# Patient Record
Sex: Female | Born: 1945 | Race: White | Hispanic: No | Smoking: Never smoker
Health system: Southern US, Community
[De-identification: ages and names within clinical notes are randomized; demographics above are authoritative.]

## PROBLEM LIST (undated history)

## (undated) DIAGNOSIS — M159 Polyosteoarthritis, unspecified: Secondary | ICD-10-CM

## (undated) DIAGNOSIS — K649 Unspecified hemorrhoids: Secondary | ICD-10-CM

## (undated) DIAGNOSIS — K5903 Drug induced constipation: Secondary | ICD-10-CM

## (undated) DIAGNOSIS — H2513 Age-related nuclear cataract, bilateral: Secondary | ICD-10-CM

## (undated) DIAGNOSIS — M898X9 Other specified disorders of bone, unspecified site: Secondary | ICD-10-CM

## (undated) DIAGNOSIS — R42 Dizziness and giddiness: Secondary | ICD-10-CM

## (undated) DIAGNOSIS — H401111 Primary open-angle glaucoma, right eye, mild stage: Secondary | ICD-10-CM

## (undated) DIAGNOSIS — N390 Urinary tract infection, site not specified: Secondary | ICD-10-CM

## (undated) DIAGNOSIS — R55 Syncope and collapse: Secondary | ICD-10-CM

## (undated) DIAGNOSIS — Z9889 Other specified postprocedural states: Secondary | ICD-10-CM

## (undated) DIAGNOSIS — M1990 Unspecified osteoarthritis, unspecified site: Secondary | ICD-10-CM

## (undated) DIAGNOSIS — R269 Unspecified abnormalities of gait and mobility: Secondary | ICD-10-CM

## (undated) DIAGNOSIS — R739 Hyperglycemia, unspecified: Secondary | ICD-10-CM

## (undated) DIAGNOSIS — M6281 Muscle weakness (generalized): Secondary | ICD-10-CM

## (undated) DIAGNOSIS — R350 Frequency of micturition: Secondary | ICD-10-CM

## (undated) DIAGNOSIS — Z91199 Patient's noncompliance with other medical treatment and regimen due to unspecified reason: Secondary | ICD-10-CM

## (undated) DIAGNOSIS — E785 Hyperlipidemia, unspecified: Secondary | ICD-10-CM

## (undated) DIAGNOSIS — N3281 Overactive bladder: Secondary | ICD-10-CM

## (undated) DIAGNOSIS — T4145XA Adverse effect of unspecified anesthetic, initial encounter: Secondary | ICD-10-CM

## (undated) DIAGNOSIS — T380X5A Adverse effect of glucocorticoids and synthetic analogues, initial encounter: Secondary | ICD-10-CM

## (undated) DIAGNOSIS — T148XXA Other injury of unspecified body region, initial encounter: Secondary | ICD-10-CM

## (undated) DIAGNOSIS — G319 Degenerative disease of nervous system, unspecified: Secondary | ICD-10-CM

## (undated) DIAGNOSIS — I10 Essential (primary) hypertension: Secondary | ICD-10-CM

## (undated) DIAGNOSIS — M7730 Calcaneal spur, unspecified foot: Secondary | ICD-10-CM

## (undated) DIAGNOSIS — Z8616 Personal history of COVID-19: Secondary | ICD-10-CM

## (undated) DIAGNOSIS — E46 Unspecified protein-calorie malnutrition: Secondary | ICD-10-CM

## (undated) DIAGNOSIS — T8859XA Other complications of anesthesia, initial encounter: Secondary | ICD-10-CM

## (undated) DIAGNOSIS — D72829 Elevated white blood cell count, unspecified: Secondary | ICD-10-CM

## (undated) DIAGNOSIS — D696 Thrombocytopenia, unspecified: Secondary | ICD-10-CM

## (undated) DIAGNOSIS — Z8619 Personal history of other infectious and parasitic diseases: Secondary | ICD-10-CM

## (undated) DIAGNOSIS — H401121 Primary open-angle glaucoma, left eye, mild stage: Secondary | ICD-10-CM

## (undated) DIAGNOSIS — E538 Deficiency of other specified B group vitamins: Secondary | ICD-10-CM

## (undated) DIAGNOSIS — G8918 Other acute postprocedural pain: Secondary | ICD-10-CM

## (undated) DIAGNOSIS — E8809 Other disorders of plasma-protein metabolism, not elsewhere classified: Secondary | ICD-10-CM

## (undated) DIAGNOSIS — D62 Acute posthemorrhagic anemia: Secondary | ICD-10-CM

## (undated) DIAGNOSIS — H409 Unspecified glaucoma: Secondary | ICD-10-CM

## (undated) DIAGNOSIS — R030 Elevated blood-pressure reading, without diagnosis of hypertension: Secondary | ICD-10-CM

## (undated) DIAGNOSIS — F331 Major depressive disorder, recurrent, moderate: Secondary | ICD-10-CM

## (undated) DIAGNOSIS — M5030 Other cervical disc degeneration, unspecified cervical region: Secondary | ICD-10-CM

## (undated) DIAGNOSIS — K219 Gastro-esophageal reflux disease without esophagitis: Secondary | ICD-10-CM

## (undated) DIAGNOSIS — E782 Mixed hyperlipidemia: Secondary | ICD-10-CM

## (undated) DIAGNOSIS — H269 Unspecified cataract: Secondary | ICD-10-CM

## (undated) DIAGNOSIS — R112 Nausea with vomiting, unspecified: Secondary | ICD-10-CM

## (undated) HISTORY — DX: Elevated white blood cell count, unspecified: D72.829

## (undated) HISTORY — DX: Elevated blood-pressure reading, without diagnosis of hypertension: R03.0

## (undated) HISTORY — DX: Unspecified glaucoma: H40.9

## (undated) HISTORY — PX: SHOULDER SURGERY: SHX246

## (undated) HISTORY — DX: Mixed hyperlipidemia: E78.2

## (undated) HISTORY — DX: Overactive bladder: N32.81

## (undated) HISTORY — PX: TONSILLECTOMY: SUR1361

## (undated) HISTORY — DX: Calcaneal spur, unspecified foot: M77.30

## (undated) HISTORY — DX: Other injury of unspecified body region, initial encounter: T14.8XXA

## (undated) HISTORY — PX: OVARIAN CYST REMOVAL: SHX89

## (undated) HISTORY — PX: BLADDER SUSPENSION: SHX72

## (undated) HISTORY — PX: KNEE SURGERY: SHX244

## (undated) HISTORY — DX: Thrombocytopenia, unspecified: D69.6

## (undated) HISTORY — DX: Patient's noncompliance with other medical treatment and regimen due to unspecified reason: Z91.199

## (undated) HISTORY — DX: Dizziness and giddiness: R42

## (undated) HISTORY — DX: Other specified disorders of bone, unspecified site: M89.8X9

## (undated) HISTORY — PX: CERVICAL FUSION: SHX112

## (undated) HISTORY — DX: Deficiency of other specified B group vitamins: E53.8

## (undated) HISTORY — DX: Other cervical disc degeneration, unspecified cervical region: M50.30

## (undated) HISTORY — DX: Polyosteoarthritis, unspecified: M15.9

## (undated) HISTORY — DX: Personal history of COVID-19: Z86.16

## (undated) HISTORY — DX: Age-related nuclear cataract, bilateral: H25.13

## (undated) HISTORY — DX: Other acute postprocedural pain: G89.18

## (undated) HISTORY — PX: CERVICAL DISC SURGERY: SHX588

## (undated) HISTORY — DX: Muscle weakness (generalized): M62.81

## (undated) HISTORY — DX: Unspecified cataract: H26.9

## (undated) HISTORY — DX: Unspecified osteoarthritis, unspecified site: M19.90

## (undated) HISTORY — DX: Major depressive disorder, recurrent, moderate: F33.1

## (undated) HISTORY — DX: Acute posthemorrhagic anemia: D62

## (undated) HISTORY — PX: APPENDECTOMY: SHX54

## (undated) HISTORY — DX: Degenerative disease of nervous system, unspecified: G31.9

## (undated) HISTORY — DX: Essential (primary) hypertension: I10

## (undated) HISTORY — DX: Primary open-angle glaucoma, right eye, mild stage: H40.1111

## (undated) HISTORY — DX: Dizziness and giddiness: R55

## (undated) HISTORY — DX: Hyperglycemia, unspecified: R73.9

## (undated) HISTORY — DX: Other disorders of plasma-protein metabolism, not elsewhere classified: E88.09

## (undated) HISTORY — DX: Hyperglycemia, unspecified: T38.0X5A

## (undated) HISTORY — PX: CHOLECYSTECTOMY: SHX55

## (undated) HISTORY — DX: Hyperlipidemia, unspecified: E78.5

## (undated) HISTORY — DX: Unspecified protein-calorie malnutrition: E46

## (undated) HISTORY — DX: Unspecified abnormalities of gait and mobility: R26.9

## (undated) HISTORY — DX: Frequency of micturition: R35.0

## (undated) HISTORY — DX: Unspecified hemorrhoids: K64.9

## (undated) HISTORY — DX: Gastro-esophageal reflux disease without esophagitis: K21.9

## (undated) HISTORY — DX: Drug induced constipation: K59.03

## (undated) HISTORY — DX: Primary open-angle glaucoma, left eye, mild stage: H40.1121

---

## 1999-01-06 ENCOUNTER — Other Ambulatory Visit: Admission: RE | Admit: 1999-01-06 | Discharge: 1999-01-06 | Payer: Self-pay | Admitting: Obstetrics and Gynecology

## 2000-02-02 ENCOUNTER — Other Ambulatory Visit: Admission: RE | Admit: 2000-02-02 | Discharge: 2000-02-02 | Payer: Self-pay | Admitting: Obstetrics and Gynecology

## 2001-02-04 ENCOUNTER — Other Ambulatory Visit: Admission: RE | Admit: 2001-02-04 | Discharge: 2001-02-04 | Payer: Self-pay | Admitting: Obstetrics and Gynecology

## 2001-05-15 ENCOUNTER — Ambulatory Visit (HOSPITAL_COMMUNITY): Admission: RE | Admit: 2001-05-15 | Discharge: 2001-05-15 | Payer: Self-pay | Admitting: Gastroenterology

## 2002-02-11 ENCOUNTER — Other Ambulatory Visit: Admission: RE | Admit: 2002-02-11 | Discharge: 2002-02-11 | Payer: Self-pay | Admitting: Obstetrics and Gynecology

## 2003-04-27 ENCOUNTER — Other Ambulatory Visit: Admission: RE | Admit: 2003-04-27 | Discharge: 2003-04-27 | Payer: Self-pay | Admitting: Obstetrics and Gynecology

## 2004-03-30 ENCOUNTER — Ambulatory Visit (HOSPITAL_COMMUNITY): Admission: RE | Admit: 2004-03-30 | Discharge: 2004-03-30 | Payer: Self-pay | Admitting: Obstetrics and Gynecology

## 2004-03-30 ENCOUNTER — Encounter (INDEPENDENT_AMBULATORY_CARE_PROVIDER_SITE_OTHER): Payer: Self-pay | Admitting: *Deleted

## 2004-04-26 ENCOUNTER — Other Ambulatory Visit: Admission: RE | Admit: 2004-04-26 | Discharge: 2004-04-26 | Payer: Self-pay | Admitting: Obstetrics and Gynecology

## 2005-05-26 ENCOUNTER — Other Ambulatory Visit: Admission: RE | Admit: 2005-05-26 | Discharge: 2005-05-26 | Payer: Self-pay | Admitting: Obstetrics and Gynecology

## 2007-07-22 ENCOUNTER — Encounter: Admission: RE | Admit: 2007-07-22 | Discharge: 2007-07-22 | Payer: Self-pay | Admitting: Orthopedic Surgery

## 2007-08-07 ENCOUNTER — Encounter: Admission: RE | Admit: 2007-08-07 | Discharge: 2007-08-07 | Payer: Self-pay | Admitting: Orthopedic Surgery

## 2009-02-20 ENCOUNTER — Encounter: Admission: RE | Admit: 2009-02-20 | Discharge: 2009-02-20 | Payer: Self-pay | Admitting: Internal Medicine

## 2009-03-29 ENCOUNTER — Encounter: Admission: RE | Admit: 2009-03-29 | Discharge: 2009-03-29 | Payer: Self-pay | Admitting: Neurosurgery

## 2009-05-10 ENCOUNTER — Ambulatory Visit (HOSPITAL_COMMUNITY): Admission: RE | Admit: 2009-05-10 | Discharge: 2009-05-11 | Payer: Self-pay | Admitting: Neurosurgery

## 2009-06-02 ENCOUNTER — Encounter: Admission: RE | Admit: 2009-06-02 | Discharge: 2009-06-02 | Payer: Self-pay | Admitting: Neurosurgery

## 2009-07-01 ENCOUNTER — Encounter: Admission: RE | Admit: 2009-07-01 | Discharge: 2009-07-01 | Payer: Self-pay | Admitting: Neurosurgery

## 2010-01-15 ENCOUNTER — Encounter: Admission: RE | Admit: 2010-01-15 | Discharge: 2010-01-15 | Payer: Self-pay | Admitting: Neurosurgery

## 2010-01-26 ENCOUNTER — Encounter: Admission: RE | Admit: 2010-01-26 | Discharge: 2010-01-26 | Payer: Self-pay | Admitting: Neurosurgery

## 2011-01-03 ENCOUNTER — Other Ambulatory Visit: Payer: Self-pay | Admitting: Neurosurgery

## 2011-01-03 DIAGNOSIS — M542 Cervicalgia: Secondary | ICD-10-CM

## 2011-01-08 ENCOUNTER — Ambulatory Visit
Admission: RE | Admit: 2011-01-08 | Discharge: 2011-01-08 | Disposition: A | Discharge status: Home / Self Care | Payer: Medicare Other | Source: Ambulatory Visit | Attending: Neurosurgery | Admitting: Neurosurgery

## 2011-01-08 DIAGNOSIS — M542 Cervicalgia: Secondary | ICD-10-CM

## 2011-02-20 LAB — URINE MICROSCOPIC-ADD ON

## 2011-02-20 LAB — URINALYSIS, ROUTINE W REFLEX MICROSCOPIC
Bilirubin Urine: NEGATIVE
Bilirubin Urine: NEGATIVE
Glucose, UA: NEGATIVE mg/dL
Ketones, ur: NEGATIVE mg/dL
Leukocytes, UA: NEGATIVE
Protein, ur: NEGATIVE mg/dL
Protein, ur: NEGATIVE mg/dL
Specific Gravity, Urine: 1.019 (ref 1.005–1.030)
Urobilinogen, UA: 0.2 mg/dL (ref 0.0–1.0)
pH: 5.5 (ref 5.0–8.0)

## 2011-02-20 LAB — BASIC METABOLIC PANEL
Chloride: 107 mEq/L (ref 96–112)
Creatinine, Ser: 0.75 mg/dL (ref 0.4–1.2)
GFR calc non Af Amer: >60 mL/min (ref >60)
Potassium: 3.9 mEq/L (ref 3.5–5.1)
Sodium: 141 mEq/L (ref 135–145)

## 2011-02-20 LAB — CBC
Hemoglobin: 13.2 g/dL (ref 12.0–15.0)
MCV: 86.2 fL (ref 78.0–100.0)
RBC: 4.57 MIL/uL (ref 3.87–5.11)
RDW: 14.3 % (ref 11.5–15.5)

## 2011-02-20 LAB — URINE CULTURE: Colony Count: NO GROWTH

## 2011-03-28 NOTE — Op Note (Signed)
[name], [name]                 ACCOUNT NO.:  [id number]   MEDICAL RECORD NO.:  [id number]          PATIENT TYPE:  OIB   LOCATION:  3030                         FACILITY:  [facility]   PHYSICIAN:  Donalee Citrin, M.D.        DATE OF BIRTH:  1946/01/18   DATE OF PROCEDURE:  05/10/2009  DATE OF DISCHARGE:                               OPERATIVE REPORT   PREOPERATIVE DIAGNOSES:  Cervical spondylosis with stenosis, C5  radiculopathy and early myelopathy.   PROCEDURE:  Intracervical diskectomies and fusion at C3-4 and C4-5 using  6-mm allograft wedge at C3-4 and a 7-mm allograft wedge at C4-5, a 40-mm  venture plate with six 10-UV varus screws.   SURGEON:  Donalee Citrin, MD   ASSISTANT:  Kathaleen Maser. Pool, MD   ANESTHESIA:  Endotracheal.   HISTORY OF PRESENT ILLNESS:  The patient is a very pleasant 65 year old  female who has had progressive worsening of neck pain radiating to  predominantly her left shoulder with numbness, tingling, and weakness in  her hands.  MRI scan showed severe cervical stenosis at C3-4 and C4-5,  predominantly from spondylosis with bi-foraminal stenosis at C5 nerve  root, central canal stenosis and spinal cord compression at C3-4.  The  patient is failure of conservative treatment and due to her clinical  exam and MRI findings, it was recommended 2-level anterior cervical  diskectomy and fusion.  Risks and benefits of the operation were  explained to the patient as seen in this report.   The patient was brought to the OR and induced under anesthesia with  supine in slight extension 5 pounds of halter traction.  The right side  of the next was prepped and draped in sterile fashion.  Preop incision  was localized at the appropriate level.  A curvilinear incision was made  just off midline to the anterior border of the sternocleidomastoid.  The  superficial layer of the platysma was dissected out and divided  longitudinally.  The avascular plane between the  sternocleidomastoid and  strap muscle was developed down to the prevertebral fascia.  Prevertebral fascia was dissected with Kitner.  Intraoperative  fluoroscopy confirmed localization at appropriate level being C4-5.  Annulotomy was made with a 15-mm scalpel and marked the disk place and  longus colli was reflected laterally at both C3-4 and C4-5, then self-  retaining retractor was placed, both disk spaces were further incised.  Large anterior osteophytes were bitten off the C3 and C4 vertebral  bodies, both disk spaces were drilled down the post annulus and  osteophyte complex, first working at C4-5 under microscopic  illumination.  A 1-mm Kerrison punch was used to underbite both the C4  and C5 vertebral bodies to get underneath the PLL, which was removed in  piecemeal fashion exposing the dura and thecal sac.  Then aggressive  under biting of both endplates marching across to the level of both C5  pedicles, and both C5 neural foramina were identified and the left C5  nerve root was significantly decompressed and was known to be markedly  stenotic predominantly from  uncinate hypertrophy.  After this was under  bitten and removed, the C5 neural foramina easily accepted nerve hook  marching across to the right side.  The right side C5 nerve was also  decompressed.  After the diskectomy was completed, there was no further  stenosis seated centrally or foraminal.  Gelfoam was placed intact.  Attention was taken to C3-4 in a similar fashion.  Aggressive under  biting of both the C3 and C4 vertebral bodies was carried out to  decompress the central canal where the predominance of the stenosis was  from a large osteophyte coming off the C3 vertebral body.  After this  was removed, the central canal was decompressed.  Both C4 pedicles were  identified to confirm adequate lateral margins of the diskectomy.  Then,  both sets of endplates were scraped.  Meticulous hemostasis was  maintained and  6-mm allograft was inserted at C3-4 and 7-mm allograft  was inserted at C4-5.  A 40-mm venture plate was incised and six 13-mm  Varus screws were placed.  All screws had excellent purchase and locking  mechanism was engaged.  The wound was then copiously irrigated and  hemostasis was maintained with the aid of Gelfoam and Surgifoam.  Then,  after all this, postop fluoroscopy confirmed good position of plates,  screws, and bone grafts.  The wound was closed in layers with  interrupted Vicryl and platysma, and a running 4-0 subcuticular and  benzoin Steri-Strips applied.  The patient went to recovery room in  stable condition.  At the end of case instrument and sponge counts were  counts.           ______________________________  Donalee Citrin, M.D.     GC/MEDQ  D:  05/10/2009  T:  05/10/2009  Job:  [id number]

## 2011-03-31 NOTE — Op Note (Signed)
NAMEClariza, [name]                           ACCOUNT NO.:  [id number]   MEDICAL RECORD NO.:  [id number]                   PATIENT TYPE:  AMB   LOCATION:  SDC                                  FACILITY:  [facility]   PHYSICIAN:  Guy Sandifer. Arleta Creek, M.D.           DATE OF BIRTH:  Oct 30, 1946   DATE OF PROCEDURE:  03/30/2004  DATE OF DISCHARGE:                                 OPERATIVE REPORT   PREOPERATIVE DIAGNOSES:  Postmenopausal bleeding.   POSTOPERATIVE DIAGNOSES:  Postmenopausal bleeding.   PROCEDURE:  1. Hysteroscopy with resection of endometrial polyp.  2. Dilatation and curettage.   SURGEON:  Guy Sandifer. Henderson Cloud, M.D.   ANESTHESIA:  1. MAC.  2. 1% Xylocaine paracervical block.   ESTIMATED BLOOD LOSS:  Less than 50 mL.   SPECIMENS:  1. Endometrial polyp.  2. Endometrial curettings.   I&O AND SORBITOL DISTENDING MEDIA:  50 mL deficit.   INDICATIONS FOR PROCEDURE:  This patient is a 65 year old, married white  female, G2, P2, whose eight years postmenopausal with postmenopausal  bleeding. Details are dictated in the history and physical.  Hysteroscopy  with resectoscope, dilatation and curettage has been discussed with the  patient preoperatively. The potential risk and complications have been  reviewed including but not limited to infection, uterine perforation, bowel,  bladder, ureteral damage, bleeding requiring transfusion of blood products  and possible transfusion reactive, HIV and hepatitis acquisition, DVT, PE,  pneumonia, laparoscopy, laparotomy, hysterectomy, recurrent abnormal  bleeding. All questions have been answered and consent is signed on the  chart.   FINDINGS:  There is an approximately 8 mm polypoid process in the right  upper anterior endometrial canal.  Both fallopian tube ostia are identified.   DESCRIPTION OF PROCEDURE:  The patient is taken to the operating room where  she is identified, placed in dorsal supine position and given intravenous  anesthetic.  She was then placed in the dorsal lithotomy position where she  was gently prepped, bladder straight catheterized and she is draped in a  sterile fashion. A bivalve speculum was placed, the anterior cervical lip  was injected with 1% Xylocaine and grasped with a single tooth tenaculum.  Paracervical block is placed at the 2, 4, 5, 7, 8 and 10 o'clock positions  with approximately 20 mL total of 1% Xylocaine.  The cervix is gently  progressively dilated to a 27 Pratt dilator. Diagnostic hysteroscope was  placed in the endocervical canal and advanced under direct visualization  using sorbitol distending media. The above findings were noted. The  diagnostic hysteroscope was withdrawn, cervix was dilated to 33 Pratt  dilator and the resectoscope with a single right angled wire loop was  placed. The above described polyp is resected in a simpler fashion. This was  sent to pathology. Good hemostasis is noted.  Sharp curettage is then  carried out. Very little tissue is obtained with curettage.  Instruments are  removed, good hemostasis is obtained and all counts are correct. The patient  is awakened and taken to the recovery room in stable condition.                                               Guy Sandifer Arleta Creek, M.D.    JET/MEDQ  D:  03/30/2004  T:  03/30/2004  Job:  [id number]

## 2011-03-31 NOTE — H&P (Signed)
NAMEHalston, [name] B                           ACCOUNT NO.:  [id number]   MEDICAL RECORD NO.:  [id number]                   PATIENT TYPE:  AMB   LOCATION:  SDC                                  FACILITY:  [facility]   PHYSICIAN:  Guy Sandifer. Arleta Creek, M.D.           DATE OF BIRTH:  10-15-1946   DATE OF ADMISSION:  DATE OF DISCHARGE:                                HISTORY & PHYSICAL   FOR ADMISSION:  To [facility] on 03/30/2004.   CHIEF COMPLAINT:  Postmenopausal bleeding.   HISTORY OF PRESENT ILLNESS:  This patient is a 65 year old, married, white  female, G2 P2 with her last menstrual period about 8 years ago who had an  episode of postmenopausal bleeding.  Ultrasound on 02/18/2004 revealed the  uterus measuring 6.1 x 2.8 x 4.2 cm.  A sonohysterogram revealed some  irregular tissue outlines on the anterior wall of the endometrial canal.  The ovaries were normal.  Pap smear was benign in June of 2004. The patient  is being admitted for hysteroscopy, D&C, possible resectoscope.  The  potential risks and complications have been discussed with the patient  preoperatively.   PAST MEDICAL HISTORY:  1. Cervical dysplasia.  2. Reflux.  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  4. Hemorrhoids.   PAST SURGICAL HISTORY:  Ovarian cystectomy.   FAMILY HISTORY:  Diabetes in the father, prostate cancer in the father,  liver cancer paternal grandmother, hypertension father and mother, heart  disease maternal grandfather, rheumatic fever in father, asthma in maternal  grandfather.   OBSTETRIC HISTORY:  Vaginal delivery x2.   MEDICATIONS:  1. B12 shots.  2. Prevacid.  3. Tylenol sinus p.r.n.   ALLERGIES:  SULFA DRUGS leading to nausea and vomiting, AUGMENTIN leading to  nausea and vomiting, CECLOR leading to nausea and vomiting.   SOCIAL HISTORY:  The patient denies tobacco, alcohol or drug abuse.   REVIEW OF SYSTEMS:  NEUROLOGIC:  Denies recent headache.  CARDIOVASCULAR:  Denies chest pain.  PULMONARY:  Patient had bronchitis in April.  GI:  Reflux as above.   PHYSICAL EXAMINATION:  VITAL SIGNS:  Height 5 feet 5 inches.  Weight 187-1/2  pounds.  Blood pressure 148/78.  HEENT:  Without thyromegaly.  LUNGS:  Clear to auscultation.  HEART:  Regular rate and rhythm.  BACK:  Without CVA or tenderness.  BREASTS:  Without masses or discharge.  ABDOMEN:  Soft, nontender without masses.  PELVIC:  Vulva, vagina and cervix without lesions.  First and second degree  cystocele is noted.  Uterus is normal size extremely mobile and nontender  Adnexa nontender without masses.  EXTREMITIES AND NEUROLOGIC:  Grossly within normal limits.   ASSESSMENT:  Postmenopausal bleeding.   PLAN:  Hysteroscopy, resectoscope, D&C.  Guy Sandifer Arleta Creek, M.D.    JET/MEDQ  D:  03/29/2004  T:  03/29/2004  Job:  [id number]

## 2011-03-31 NOTE — Procedures (Signed)
[facility]  Patient:    [name], [name]                   MRN: [id number] Proc. Date: 05/15/01 Adm. Date:  [id number] Attending:  Orland Mustard CC:         Guy Sandifer. Arleta Creek, M.D.  Feliciana Rossetti, M.D.   Procedure Report  PROCEDURE:  Colonoscopy.  MEDICATIONS:  Fentanyl 62.5 mcg, Versed 7 mg IV.  SCOPE:  Olympus pediatric video colonoscope.  INDICATIONS:  Rectal bleeding in a 65 year old woman.  DESCRIPTION OF PROCEDURE:  The procedure had been explained to the patient and consent obtained.  With the patient in the left lateral decubitus position, the Olympus pediatric video colonoscope was inserted and advanced under direct visualization.  The prep was excellent.  We were able to advance around to the cecum.  The ileocecal valve was identified.  The scope was withdrawn.  The cecum, ascending colon, hepatic flexure, transverse colon, splenic flexure, descending, and sigmoid colon were seen well upon removal, and no polyps or other lesions seen.  The scope was withdrawn to the rectum.  There were moderate-sized internal hemorrhoids.  The scope was withdrawn.  The patient tolerated the procedure well and was maintained on low-flow oxygen and pulse oximeter throughout the procedure with no obvious problem.  ASSESSMENT:  Internal hemorrhoids, probably the source of bleeding.  PLAN:  We will have the patient take fiber supplements daily.  We will give a hemorrhoid sheet and see back on an as-needed basis. DD:  05/15/01 TD:  05/15/01 Job: [id number]

## 2011-07-25 ENCOUNTER — Ambulatory Visit
Admission: RE | Admit: 2011-07-25 | Discharge: 2011-07-25 | Disposition: A | Discharge status: Home / Self Care | Payer: Medicare Other | Source: Ambulatory Visit | Attending: Neurosurgery | Admitting: Neurosurgery

## 2011-07-25 ENCOUNTER — Other Ambulatory Visit: Payer: Self-pay | Admitting: Neurosurgery

## 2011-07-25 DIAGNOSIS — M542 Cervicalgia: Secondary | ICD-10-CM

## 2013-02-06 ENCOUNTER — Other Ambulatory Visit: Payer: Self-pay | Admitting: Neurosurgery

## 2013-02-06 DIAGNOSIS — M5412 Radiculopathy, cervical region: Secondary | ICD-10-CM

## 2013-02-06 DIAGNOSIS — M4802 Spinal stenosis, cervical region: Secondary | ICD-10-CM

## 2013-02-12 ENCOUNTER — Ambulatory Visit
Admission: RE | Admit: 2013-02-12 | Discharge: 2013-02-12 | Disposition: A | Discharge status: Home / Self Care | Payer: Medicare Other | Source: Ambulatory Visit | Attending: Neurosurgery | Admitting: Neurosurgery

## 2013-02-12 DIAGNOSIS — M5412 Radiculopathy, cervical region: Secondary | ICD-10-CM

## 2013-02-12 DIAGNOSIS — M4802 Spinal stenosis, cervical region: Secondary | ICD-10-CM

## 2013-12-04 DIAGNOSIS — M5412 Radiculopathy, cervical region: Secondary | ICD-10-CM | POA: Insufficient documentation

## 2014-07-16 DIAGNOSIS — M5126 Other intervertebral disc displacement, lumbar region: Secondary | ICD-10-CM | POA: Insufficient documentation

## 2015-01-12 DIAGNOSIS — M5136 Other intervertebral disc degeneration, lumbar region: Secondary | ICD-10-CM | POA: Insufficient documentation

## 2015-12-27 DIAGNOSIS — M159 Polyosteoarthritis, unspecified: Secondary | ICD-10-CM | POA: Insufficient documentation

## 2015-12-27 HISTORY — DX: Polyosteoarthritis, unspecified: M15.9

## 2018-10-08 ENCOUNTER — Inpatient Hospital Stay (HOSPITAL_COMMUNITY)
Admission: AD | Admit: 2018-10-08 | Discharge: 2018-10-14 | DRG: 470 | Disposition: A | Discharge status: Rehabilitation Facility | Payer: Medicare Other | Source: Other Acute Inpatient Hospital | Attending: Internal Medicine | Admitting: Internal Medicine

## 2018-10-08 ENCOUNTER — Inpatient Hospital Stay (HOSPITAL_COMMUNITY): Payer: Medicare Other

## 2018-10-08 ENCOUNTER — Encounter (HOSPITAL_COMMUNITY)
Admission: AD | Disposition: A | Discharge status: Rehabilitation Facility | Payer: Self-pay | Source: Other Acute Inpatient Hospital | Attending: Internal Medicine

## 2018-10-08 ENCOUNTER — Inpatient Hospital Stay
Admission: AD | Admit: 2018-10-08 | Payer: Medicare Other | Source: Other Acute Inpatient Hospital | Admitting: Orthopedic Surgery

## 2018-10-08 ENCOUNTER — Inpatient Hospital Stay (HOSPITAL_COMMUNITY): Payer: Medicare Other | Admitting: Certified Registered Nurse Anesthetist

## 2018-10-08 ENCOUNTER — Other Ambulatory Visit: Payer: Self-pay

## 2018-10-08 ENCOUNTER — Encounter (HOSPITAL_COMMUNITY): Payer: Self-pay | Admitting: General Practice

## 2018-10-08 DIAGNOSIS — S72011A Unspecified intracapsular fracture of right femur, initial encounter for closed fracture: Secondary | ICD-10-CM | POA: Diagnosis present

## 2018-10-08 DIAGNOSIS — W010XXA Fall on same level from slipping, tripping and stumbling without subsequent striking against object, initial encounter: Secondary | ICD-10-CM | POA: Diagnosis present

## 2018-10-08 DIAGNOSIS — K5903 Drug induced constipation: Secondary | ICD-10-CM

## 2018-10-08 DIAGNOSIS — Z8679 Personal history of other diseases of the circulatory system: Secondary | ICD-10-CM

## 2018-10-08 DIAGNOSIS — R739 Hyperglycemia, unspecified: Secondary | ICD-10-CM

## 2018-10-08 DIAGNOSIS — R339 Retention of urine, unspecified: Secondary | ICD-10-CM | POA: Diagnosis not present

## 2018-10-08 DIAGNOSIS — Z789 Other specified health status: Secondary | ICD-10-CM

## 2018-10-08 DIAGNOSIS — M810 Age-related osteoporosis without current pathological fracture: Secondary | ICD-10-CM | POA: Diagnosis present

## 2018-10-08 DIAGNOSIS — D696 Thrombocytopenia, unspecified: Secondary | ICD-10-CM | POA: Diagnosis present

## 2018-10-08 DIAGNOSIS — Z882 Allergy status to sulfonamides status: Secondary | ICD-10-CM

## 2018-10-08 DIAGNOSIS — R030 Elevated blood-pressure reading, without diagnosis of hypertension: Secondary | ICD-10-CM | POA: Diagnosis not present

## 2018-10-08 DIAGNOSIS — R350 Frequency of micturition: Secondary | ICD-10-CM

## 2018-10-08 DIAGNOSIS — Z981 Arthrodesis status: Secondary | ICD-10-CM

## 2018-10-08 DIAGNOSIS — T148XXA Other injury of unspecified body region, initial encounter: Secondary | ICD-10-CM

## 2018-10-08 DIAGNOSIS — Z888 Allergy status to other drugs, medicaments and biological substances status: Secondary | ICD-10-CM

## 2018-10-08 DIAGNOSIS — T380X5A Adverse effect of glucocorticoids and synthetic analogues, initial encounter: Secondary | ICD-10-CM | POA: Diagnosis not present

## 2018-10-08 DIAGNOSIS — Z01818 Encounter for other preprocedural examination: Secondary | ICD-10-CM

## 2018-10-08 DIAGNOSIS — E8889 Other specified metabolic disorders: Secondary | ICD-10-CM | POA: Diagnosis present

## 2018-10-08 DIAGNOSIS — D62 Acute posthemorrhagic anemia: Secondary | ICD-10-CM | POA: Diagnosis present

## 2018-10-08 DIAGNOSIS — K5900 Constipation, unspecified: Secondary | ICD-10-CM | POA: Diagnosis present

## 2018-10-08 DIAGNOSIS — T454X5A Adverse effect of iron and its compounds, initial encounter: Secondary | ICD-10-CM | POA: Diagnosis present

## 2018-10-08 DIAGNOSIS — M898X9 Other specified disorders of bone, unspecified site: Secondary | ICD-10-CM

## 2018-10-08 DIAGNOSIS — Z880 Allergy status to penicillin: Secondary | ICD-10-CM

## 2018-10-08 DIAGNOSIS — Z881 Allergy status to other antibiotic agents status: Secondary | ICD-10-CM

## 2018-10-08 DIAGNOSIS — Z66 Do not resuscitate: Secondary | ICD-10-CM | POA: Diagnosis present

## 2018-10-08 DIAGNOSIS — S72001A Fracture of unspecified part of neck of right femur, initial encounter for closed fracture: Secondary | ICD-10-CM

## 2018-10-08 DIAGNOSIS — G8918 Other acute postprocedural pain: Secondary | ICD-10-CM | POA: Diagnosis not present

## 2018-10-08 DIAGNOSIS — R5381 Other malaise: Secondary | ICD-10-CM | POA: Diagnosis present

## 2018-10-08 DIAGNOSIS — Z87440 Personal history of urinary (tract) infections: Secondary | ICD-10-CM | POA: Diagnosis not present

## 2018-10-08 DIAGNOSIS — Z887 Allergy status to serum and vaccine status: Secondary | ICD-10-CM | POA: Diagnosis not present

## 2018-10-08 DIAGNOSIS — I10 Essential (primary) hypertension: Secondary | ICD-10-CM | POA: Diagnosis present

## 2018-10-08 DIAGNOSIS — Z96641 Presence of right artificial hip joint: Secondary | ICD-10-CM | POA: Diagnosis present

## 2018-10-08 DIAGNOSIS — S72001S Fracture of unspecified part of neck of right femur, sequela: Secondary | ICD-10-CM | POA: Diagnosis not present

## 2018-10-08 DIAGNOSIS — M9080 Osteopathy in diseases classified elsewhere, unspecified site: Secondary | ICD-10-CM | POA: Diagnosis not present

## 2018-10-08 DIAGNOSIS — Y92000 Kitchen of unspecified non-institutional (private) residence as  the place of occurrence of the external cause: Secondary | ICD-10-CM | POA: Diagnosis not present

## 2018-10-08 DIAGNOSIS — W010XXD Fall on same level from slipping, tripping and stumbling without subsequent striking against object, subsequent encounter: Secondary | ICD-10-CM | POA: Diagnosis present

## 2018-10-08 DIAGNOSIS — Z886 Allergy status to analgesic agent status: Secondary | ICD-10-CM

## 2018-10-08 DIAGNOSIS — E46 Unspecified protein-calorie malnutrition: Secondary | ICD-10-CM | POA: Diagnosis present

## 2018-10-08 DIAGNOSIS — D72829 Elevated white blood cell count, unspecified: Secondary | ICD-10-CM | POA: Diagnosis not present

## 2018-10-08 DIAGNOSIS — Z9049 Acquired absence of other specified parts of digestive tract: Secondary | ICD-10-CM | POA: Diagnosis not present

## 2018-10-08 DIAGNOSIS — K219 Gastro-esophageal reflux disease without esophagitis: Secondary | ICD-10-CM | POA: Diagnosis present

## 2018-10-08 DIAGNOSIS — E559 Vitamin D deficiency, unspecified: Secondary | ICD-10-CM | POA: Diagnosis present

## 2018-10-08 DIAGNOSIS — M159 Polyosteoarthritis, unspecified: Secondary | ICD-10-CM | POA: Diagnosis not present

## 2018-10-08 DIAGNOSIS — S72001D Fracture of unspecified part of neck of right femur, subsequent encounter for closed fracture with routine healing: Secondary | ICD-10-CM | POA: Diagnosis not present

## 2018-10-08 DIAGNOSIS — Z8619 Personal history of other infectious and parasitic diseases: Secondary | ICD-10-CM | POA: Diagnosis not present

## 2018-10-08 DIAGNOSIS — E889 Metabolic disorder, unspecified: Secondary | ICD-10-CM | POA: Diagnosis not present

## 2018-10-08 DIAGNOSIS — Z9119 Patient's noncompliance with other medical treatment and regimen: Secondary | ICD-10-CM | POA: Diagnosis not present

## 2018-10-08 HISTORY — DX: Personal history of other infectious and parasitic diseases: Z86.19

## 2018-10-08 HISTORY — DX: Other specified postprocedural states: Z98.890

## 2018-10-08 HISTORY — DX: Urinary tract infection, site not specified: N39.0

## 2018-10-08 HISTORY — DX: Fracture of unspecified part of neck of right femur, initial encounter for closed fracture: S72.001A

## 2018-10-08 HISTORY — DX: Adverse effect of unspecified anesthetic, initial encounter: T41.45XA

## 2018-10-08 HISTORY — DX: Unspecified osteoarthritis, unspecified site: M19.90

## 2018-10-08 HISTORY — DX: Gastro-esophageal reflux disease without esophagitis: K21.9

## 2018-10-08 HISTORY — PX: HIP ARTHROPLASTY: SHX981

## 2018-10-08 HISTORY — DX: Other specified postprocedural states: R11.2

## 2018-10-08 HISTORY — DX: Other specified health status: Z78.9

## 2018-10-08 HISTORY — DX: Other complications of anesthesia, initial encounter: T88.59XA

## 2018-10-08 LAB — COMPREHENSIVE METABOLIC PANEL
ALT: 13 U/L (ref 0–44)
ANION GAP: 7 (ref 5–15)
AST: 16 U/L (ref 15–41)
Albumin: 3.6 g/dL (ref 3.5–5.0)
Alkaline Phosphatase: 66 U/L (ref 38–126)
BUN: 9 mg/dL (ref 8–23)
CHLORIDE: 106 mmol/L (ref 98–111)
CO2: 27 mmol/L (ref 22–32)
Calcium: 8.9 mg/dL (ref 8.9–10.3)
Creatinine, Ser: 0.95 mg/dL (ref 0.44–1.00)
GFR, EST NON AFRICAN AMERICAN: 60 mL/min — AB (ref >60)
Glucose, Bld: 97 mg/dL (ref 70–99)
POTASSIUM: 3.5 mmol/L (ref 3.5–5.1)
SODIUM: 140 mmol/L (ref 135–145)
Total Bilirubin: 0.9 mg/dL (ref 0.3–1.2)
Total Protein: 6.7 g/dL (ref 6.5–8.1)

## 2018-10-08 LAB — CBC WITH DIFFERENTIAL/PLATELET
ABS IMMATURE GRANULOCYTES: 0.02 10*3/uL (ref 0.00–0.07)
BASOS PCT: 0 %
Basophils Absolute: 0 10*3/uL (ref 0.0–0.1)
EOS ABS: 0 10*3/uL (ref 0.0–0.5)
Eosinophils Relative: 1 %
HCT: 41.8 % (ref 36.0–46.0)
Hemoglobin: 12.8 g/dL (ref 12.0–15.0)
Immature Granulocytes: 0 %
Lymphocytes Relative: 17 %
Lymphs Abs: 1.3 10*3/uL (ref 0.7–4.0)
MCH: 27 pg (ref 26.0–34.0)
MCHC: 30.6 g/dL (ref 30.0–36.0)
MCV: 88.2 fL (ref 80.0–100.0)
MONO ABS: 0.6 10*3/uL (ref 0.1–1.0)
MONOS PCT: 7 %
NEUTROS ABS: 6 10*3/uL (ref 1.7–7.7)
NEUTROS PCT: 75 %
Platelets: 137 10*3/uL — ABNORMAL LOW (ref 150–400)
RBC: 4.74 MIL/uL (ref 3.87–5.11)
RDW: 13.6 % (ref 11.5–15.5)
WBC: 8 10*3/uL (ref 4.0–10.5)
nRBC: 0 % (ref 0.0–0.2)

## 2018-10-08 LAB — PROTIME-INR
INR: 1.15
PROTHROMBIN TIME: 14.6 s (ref 11.4–15.2)

## 2018-10-08 LAB — SURGICAL PCR SCREEN
MRSA, PCR: NEGATIVE
Staphylococcus aureus: NEGATIVE

## 2018-10-08 LAB — TYPE AND SCREEN
ABO/RH(D): O POS
ANTIBODY SCREEN: NEGATIVE

## 2018-10-08 LAB — ABO/RH: ABO/RH(D): O POS

## 2018-10-08 LAB — APTT: APTT: 28 s (ref 24–36)

## 2018-10-08 SURGERY — HEMIARTHROPLASTY, HIP, DIRECT ANTERIOR APPROACH, FOR FRACTURE
Anesthesia: General | Site: Hip | Laterality: Right

## 2018-10-08 MED ORDER — MENTHOL 3 MG MT LOZG
1.0000 | LOZENGE | OROMUCOSAL | Status: DC | PRN
  Filled 2018-10-08: qty 9

## 2018-10-08 MED ORDER — HYDROCODONE-ACETAMINOPHEN 7.5-325 MG PO TABS
1.0000 | ORAL_TABLET | ORAL | Status: DC | PRN
  Administered 2018-10-09 – 2018-10-11 (×3): 2 via ORAL
  Administered 2018-10-13 (×2): 1 via ORAL
  Filled 2018-10-08: qty 2
  Filled 2018-10-08: qty 1
  Filled 2018-10-08: qty 2
  Filled 2018-10-08: qty 1
  Filled 2018-10-08: qty 2

## 2018-10-08 MED ORDER — MORPHINE SULFATE (PF) 2 MG/ML IV SOLN
2.0000 mg | INTRAVENOUS | Status: DC | PRN
  Administered 2018-10-08 (×2): 2 mg via INTRAVENOUS
  Filled 2018-10-08 (×2): qty 1

## 2018-10-08 MED ORDER — SODIUM CHLORIDE 0.9 % IR SOLN
Status: DC | PRN
  Administered 2018-10-08: 3000 mL

## 2018-10-08 MED ORDER — VANCOMYCIN HCL IN DEXTROSE 1-5 GM/200ML-% IV SOLN
1000.0000 mg | Freq: Two times a day (BID) | INTRAVENOUS | Status: AC
  Administered 2018-10-09: 1000 mg via INTRAVENOUS
  Filled 2018-10-08: qty 200

## 2018-10-08 MED ORDER — ACETAMINOPHEN 325 MG PO TABS
325.0000 mg | ORAL_TABLET | Freq: Four times a day (QID) | ORAL | Status: DC | PRN
  Administered 2018-10-10: 650 mg via ORAL
  Filled 2018-10-08: qty 2

## 2018-10-08 MED ORDER — METOCLOPRAMIDE HCL 5 MG/ML IJ SOLN: 5.0000 mg | Freq: Three times a day (TID) | INTRAMUSCULAR | Status: DC | PRN

## 2018-10-08 MED ORDER — FENTANYL CITRATE (PF) 250 MCG/5ML IJ SOLN
INTRAMUSCULAR | Status: AC
  Filled 2018-10-08: qty 5

## 2018-10-08 MED ORDER — POVIDONE-IODINE 10 % EX SWAB: 2.0000 "application " | Freq: Once | CUTANEOUS | Status: DC

## 2018-10-08 MED ORDER — ONDANSETRON HCL 4 MG/2ML IJ SOLN
4.0000 mg | Freq: Four times a day (QID) | INTRAMUSCULAR | Status: DC | PRN
  Administered 2018-10-08 – 2018-10-09 (×3): 4 mg via INTRAVENOUS
  Filled 2018-10-08: qty 2

## 2018-10-08 MED ORDER — 0.9 % SODIUM CHLORIDE (POUR BTL) OPTIME
TOPICAL | Status: DC | PRN
  Administered 2018-10-08: 1000 mL

## 2018-10-08 MED ORDER — PROMETHAZINE HCL 25 MG/ML IJ SOLN
INTRAMUSCULAR | Status: AC
  Administered 2018-10-08: 6.25 mg via INTRAVENOUS
  Filled 2018-10-08: qty 1

## 2018-10-08 MED ORDER — ACETAMINOPHEN 500 MG PO TABS
1000.0000 mg | ORAL_TABLET | Freq: Once | ORAL | Status: AC
  Administered 2018-10-08: 1000 mg via ORAL
  Filled 2018-10-08: qty 2

## 2018-10-08 MED ORDER — LIDOCAINE HCL (CARDIAC) PF 100 MG/5ML IV SOSY
PREFILLED_SYRINGE | INTRAVENOUS | Status: DC | PRN
  Administered 2018-10-08: 60 mg via INTRAVENOUS

## 2018-10-08 MED ORDER — ONDANSETRON HCL 4 MG/2ML IJ SOLN
4.0000 mg | Freq: Four times a day (QID) | INTRAMUSCULAR | Status: DC | PRN
  Filled 2018-10-08: qty 2

## 2018-10-08 MED ORDER — METHOCARBAMOL 500 MG PO TABS
500.0000 mg | ORAL_TABLET | Freq: Four times a day (QID) | ORAL | Status: DC | PRN
  Administered 2018-10-08 – 2018-10-11 (×4): 500 mg via ORAL
  Filled 2018-10-08 (×4): qty 1

## 2018-10-08 MED ORDER — FENTANYL CITRATE (PF) 100 MCG/2ML IJ SOLN
INTRAMUSCULAR | Status: AC
  Filled 2018-10-08: qty 2

## 2018-10-08 MED ORDER — LACTATED RINGERS IV SOLN
INTRAVENOUS | Status: DC
  Administered 2018-10-08 (×3): via INTRAVENOUS

## 2018-10-08 MED ORDER — CHLORHEXIDINE GLUCONATE 4 % EX LIQD
60.0000 mL | Freq: Once | CUTANEOUS | Status: AC
  Administered 2018-10-08: 4 via TOPICAL
  Filled 2018-10-08: qty 15

## 2018-10-08 MED ORDER — FENTANYL CITRATE (PF) 100 MCG/2ML IJ SOLN
25.0000 ug | INTRAMUSCULAR | Status: DC | PRN
  Administered 2018-10-08: 25 ug via INTRAVENOUS

## 2018-10-08 MED ORDER — ONDANSETRON HCL 4 MG PO TABS
4.0000 mg | ORAL_TABLET | Freq: Four times a day (QID) | ORAL | Status: DC | PRN
  Administered 2018-10-14: 2 mg via ORAL
  Filled 2018-10-08 (×2): qty 1

## 2018-10-08 MED ORDER — EPHEDRINE 5 MG/ML INJ
INTRAVENOUS | Status: AC
  Filled 2018-10-08: qty 10

## 2018-10-08 MED ORDER — METHOCARBAMOL 1000 MG/10ML IJ SOLN
500.0000 mg | Freq: Four times a day (QID) | INTRAVENOUS | Status: DC | PRN
  Administered 2018-10-08: 500 mg via INTRAVENOUS
  Filled 2018-10-08 (×2): qty 5

## 2018-10-08 MED ORDER — POLYETHYLENE GLYCOL 3350 17 G PO PACK
17.0000 g | PACK | Freq: Every day | ORAL | Status: DC
  Administered 2018-10-10 – 2018-10-11 (×2): 17 g via ORAL
  Filled 2018-10-08 (×5): qty 1

## 2018-10-08 MED ORDER — PROPOFOL 10 MG/ML IV BOLUS
INTRAVENOUS | Status: DC | PRN
  Administered 2018-10-08: 130 mg via INTRAVENOUS

## 2018-10-08 MED ORDER — ACETAMINOPHEN 10 MG/ML IV SOLN: 1000.0000 mg | Freq: Once | INTRAVENOUS | Status: DC | PRN

## 2018-10-08 MED ORDER — PHENYLEPHRINE 40 MCG/ML (10ML) SYRINGE FOR IV PUSH (FOR BLOOD PRESSURE SUPPORT)
PREFILLED_SYRINGE | INTRAVENOUS | Status: AC
  Filled 2018-10-08: qty 20

## 2018-10-08 MED ORDER — PHENOL 1.4 % MT LIQD
1.0000 | OROMUCOSAL | Status: DC | PRN
  Filled 2018-10-08: qty 177

## 2018-10-08 MED ORDER — FENTANYL CITRATE (PF) 100 MCG/2ML IJ SOLN
INTRAMUSCULAR | Status: DC | PRN
  Administered 2018-10-08 (×4): 50 ug via INTRAVENOUS
  Administered 2018-10-08: 100 ug via INTRAVENOUS

## 2018-10-08 MED ORDER — ONDANSETRON HCL 4 MG/2ML IJ SOLN
INTRAMUSCULAR | Status: AC
  Filled 2018-10-08: qty 2

## 2018-10-08 MED ORDER — SUGAMMADEX SODIUM 200 MG/2ML IV SOLN
INTRAVENOUS | Status: DC | PRN
  Administered 2018-10-08: 200 mg via INTRAVENOUS

## 2018-10-08 MED ORDER — DEXAMETHASONE SODIUM PHOSPHATE 4 MG/ML IJ SOLN
INTRAMUSCULAR | Status: DC | PRN
  Administered 2018-10-08: 5 mg via INTRAVENOUS

## 2018-10-08 MED ORDER — PHENYLEPHRINE HCL 10 MG/ML IJ SOLN
INTRAMUSCULAR | Status: DC | PRN
  Administered 2018-10-08: 80 ug via INTRAVENOUS

## 2018-10-08 MED ORDER — VANCOMYCIN HCL IN DEXTROSE 1-5 GM/200ML-% IV SOLN
1000.0000 mg | INTRAVENOUS | Status: AC
  Administered 2018-10-08: 1000 mg via INTRAVENOUS
  Filled 2018-10-08 (×2): qty 200

## 2018-10-08 MED ORDER — DOCUSATE SODIUM 100 MG PO CAPS
100.0000 mg | ORAL_CAPSULE | Freq: Two times a day (BID) | ORAL | Status: DC
  Administered 2018-10-09 – 2018-10-10 (×3): 100 mg via ORAL
  Filled 2018-10-08 (×3): qty 1

## 2018-10-08 MED ORDER — ACETAMINOPHEN 160 MG/5ML PO SOLN: 1000.0000 mg | Freq: Once | ORAL | Status: DC | PRN

## 2018-10-08 MED ORDER — SODIUM CHLORIDE 0.9 % IV SOLN: INTRAVENOUS | Status: DC | PRN

## 2018-10-08 MED ORDER — OXYCODONE HCL 5 MG/5ML PO SOLN: 5.0000 mg | Freq: Once | ORAL | Status: DC | PRN

## 2018-10-08 MED ORDER — ROCURONIUM BROMIDE 100 MG/10ML IV SOLN
INTRAVENOUS | Status: DC | PRN
  Administered 2018-10-08: 20 mg via INTRAVENOUS
  Administered 2018-10-08: 50 mg via INTRAVENOUS

## 2018-10-08 MED ORDER — PROMETHAZINE HCL 25 MG/ML IJ SOLN
6.2500 mg | Freq: Once | INTRAMUSCULAR | Status: AC
  Administered 2018-10-08: 6.25 mg via INTRAVENOUS
  Filled 2018-10-08: qty 1

## 2018-10-08 MED ORDER — SUCCINYLCHOLINE CHLORIDE 20 MG/ML IJ SOLN
INTRAMUSCULAR | Status: DC | PRN
  Administered 2018-10-08: 70 mg via INTRAVENOUS

## 2018-10-08 MED ORDER — PANTOPRAZOLE SODIUM 40 MG PO TBEC
40.0000 mg | DELAYED_RELEASE_TABLET | Freq: Every day | ORAL | Status: DC
  Administered 2018-10-09 – 2018-10-14 (×6): 40 mg via ORAL
  Filled 2018-10-08 (×6): qty 1

## 2018-10-08 MED ORDER — ACETAMINOPHEN 500 MG PO TABS
500.0000 mg | ORAL_TABLET | Freq: Three times a day (TID) | ORAL | Status: AC
  Administered 2018-10-08 – 2018-10-09 (×2): 500 mg via ORAL
  Filled 2018-10-08 (×2): qty 1

## 2018-10-08 MED ORDER — METOCLOPRAMIDE HCL 5 MG PO TABS: 5.0000 mg | ORAL_TABLET | Freq: Three times a day (TID) | ORAL | Status: DC | PRN

## 2018-10-08 MED ORDER — OXYCODONE HCL 5 MG PO TABS: 5.0000 mg | ORAL_TABLET | Freq: Once | ORAL | Status: DC | PRN

## 2018-10-08 MED ORDER — ACETAMINOPHEN 500 MG PO TABS: 1000.0000 mg | ORAL_TABLET | Freq: Once | ORAL | Status: DC | PRN

## 2018-10-08 MED ORDER — PROPOFOL 10 MG/ML IV BOLUS
INTRAVENOUS | Status: AC
  Filled 2018-10-08: qty 20

## 2018-10-08 MED ORDER — DEXAMETHASONE SODIUM PHOSPHATE 10 MG/ML IJ SOLN
INTRAMUSCULAR | Status: AC
  Filled 2018-10-08: qty 2

## 2018-10-08 MED ORDER — KETOROLAC TROMETHAMINE 15 MG/ML IJ SOLN
7.5000 mg | Freq: Four times a day (QID) | INTRAMUSCULAR | Status: AC
  Administered 2018-10-08 – 2018-10-09 (×3): 7.5 mg via INTRAVENOUS
  Filled 2018-10-08 (×3): qty 1

## 2018-10-08 MED ORDER — ENOXAPARIN SODIUM 40 MG/0.4ML ~~LOC~~ SOLN
40.0000 mg | SUBCUTANEOUS | Status: DC
  Administered 2018-10-09 – 2018-10-14 (×6): 40 mg via SUBCUTANEOUS
  Filled 2018-10-08 (×6): qty 0.4

## 2018-10-08 MED ORDER — HYDROCODONE-ACETAMINOPHEN 5-325 MG PO TABS
1.0000 | ORAL_TABLET | ORAL | Status: DC | PRN
  Administered 2018-10-09 – 2018-10-10 (×4): 2 via ORAL
  Administered 2018-10-10: 1 via ORAL
  Administered 2018-10-11 – 2018-10-14 (×6): 2 via ORAL
  Filled 2018-10-08 (×7): qty 2
  Filled 2018-10-08: qty 1
  Filled 2018-10-08 (×4): qty 2

## 2018-10-08 SURGICAL SUPPLY — 58 items
BLADE SAW SAG 73X25 THK (BLADE) ×2
BLADE SAW SGTL 73X25 THK (BLADE) ×3 IMPLANT
BNDG COHESIVE 6X5 TAN STRL LF (GAUZE/BANDAGES/DRESSINGS) ×2 IMPLANT
BRUSH FEMORAL CANAL (MISCELLANEOUS) IMPLANT
BRUSH SCRUB SURG 4.25 DISP (MISCELLANEOUS) ×6 IMPLANT
COVER SURGICAL LIGHT HANDLE (MISCELLANEOUS) ×3 IMPLANT
COVER WAND RF STERILE (DRAPES) ×3 IMPLANT
DRAPE INCISE IOBAN 85X60 (DRAPES) ×3 IMPLANT
DRAPE ORTHO SPLIT 77X108 STRL (DRAPES) ×6
DRAPE SURG ORHT 6 SPLT 77X108 (DRAPES) ×2 IMPLANT
DRAPE U-SHAPE 47X51 STRL (DRAPES) ×3 IMPLANT
DRILL BIT 7/64X5 (BIT) ×3 IMPLANT
DRSG MEPILEX BORDER 4X8 (GAUZE/BANDAGES/DRESSINGS) ×3 IMPLANT
ELECT BLADE 6.5 EXT (BLADE) ×2 IMPLANT
ELECT CAUTERY BLADE 6.4 (BLADE) ×2 IMPLANT
ELECT REM PT RETURN 9FT ADLT (ELECTROSURGICAL) ×3
ELECTRODE REM PT RTRN 9FT ADLT (ELECTROSURGICAL) ×1 IMPLANT
GLOVE BIO SURGEON STRL SZ7.5 (GLOVE) ×3 IMPLANT
GLOVE BIO SURGEON STRL SZ8 (GLOVE) ×3 IMPLANT
GLOVE BIOGEL PI IND STRL 8 (GLOVE) ×2 IMPLANT
GLOVE BIOGEL PI INDICATOR 8 (GLOVE) ×4
GOWN STRL REUS W/ TWL LRG LVL3 (GOWN DISPOSABLE) ×2 IMPLANT
GOWN STRL REUS W/ TWL XL LVL3 (GOWN DISPOSABLE) ×1 IMPLANT
GOWN STRL REUS W/TWL 2XL LVL3 (GOWN DISPOSABLE) IMPLANT
GOWN STRL REUS W/TWL LRG LVL3 (GOWN DISPOSABLE) ×9
GOWN STRL REUS W/TWL XL LVL3 (GOWN DISPOSABLE) ×3
HANDPIECE INTERPULSE COAX TIP (DISPOSABLE) ×3
HEAD FEM UNIPOLAR 45 OD STRL (Hips) ×2 IMPLANT
KIT BASIN OR (CUSTOM PROCEDURE TRAY) ×3 IMPLANT
KIT TURNOVER KIT B (KITS) ×3 IMPLANT
MANIFOLD NEPTUNE II (INSTRUMENTS) ×3 IMPLANT
NDL 1/2 CIR MAYO (NEEDLE) IMPLANT
NEEDLE 1/2 CIR MAYO (NEEDLE) ×3 IMPLANT
NS IRRIG 1000ML POUR BTL (IV SOLUTION) ×3 IMPLANT
PACK TOTAL JOINT (CUSTOM PROCEDURE TRAY) ×3 IMPLANT
PAD ARMBOARD 7.5X6 YLW CONV (MISCELLANEOUS) ×6 IMPLANT
PILLOW ABDUCTION HIP (SOFTGOODS) ×2 IMPLANT
PRESSURIZER FEMORAL UNIV (MISCELLANEOUS) IMPLANT
RETRIEVER SUT HEWSON (MISCELLANEOUS) ×3 IMPLANT
SET HNDPC FAN SPRY TIP SCT (DISPOSABLE) IMPLANT
SPACER FEM TAPERED +0 12/14 (Hips) ×2 IMPLANT
STAPLER VISISTAT 35W (STAPLE) ×3 IMPLANT
STEM DUOFIX STD SZ 3 (Stem) ×2 IMPLANT
SUT ETHILON 2 0 PSLX (SUTURE) ×6 IMPLANT
SUT FIBERWIRE #2 38 T-5 BLUE (SUTURE) ×6
SUT VIC AB 0 CT1 27 (SUTURE) ×3
SUT VIC AB 0 CT1 27XBRD ANBCTR (SUTURE) IMPLANT
SUT VIC AB 1 CT1 18XCR BRD 8 (SUTURE) ×1 IMPLANT
SUT VIC AB 1 CT1 27 (SUTURE) ×6
SUT VIC AB 1 CT1 27XBRD ANBCTR (SUTURE) ×1 IMPLANT
SUT VIC AB 1 CT1 8-18 (SUTURE) ×3
SUT VIC AB 2-0 CT1 27 (SUTURE) ×12
SUT VIC AB 2-0 CT1 TAPERPNT 27 (SUTURE) ×2 IMPLANT
SUTURE FIBERWR #2 38 T-5 BLUE (SUTURE) ×2 IMPLANT
TOWEL OR 17X24 6PK STRL BLUE (TOWEL DISPOSABLE) ×3 IMPLANT
TOWEL OR 17X26 10 PK STRL BLUE (TOWEL DISPOSABLE) ×5 IMPLANT
TOWER CARTRIDGE SMART MIX (DISPOSABLE) IMPLANT
WATER STERILE IRR 1000ML POUR (IV SOLUTION) ×3 IMPLANT

## 2018-10-08 NOTE — Progress Notes (Signed)
Nausea getting better post phenergan administration.

## 2018-10-08 NOTE — Anesthesia Preprocedure Evaluation (Signed)
Anesthesia Evaluation  Patient identified by MRN, date of birth, ID band Patient awake    Reviewed: Allergy & Precautions, NPO status , Patient's Chart, lab work & pertinent test results  History of Anesthesia Complications (+) PONV and history of anesthetic complications  Airway Mallampati: III  TM Distance: <3 FB Neck ROM: Full    Dental  (+) Teeth Intact   Pulmonary neg pulmonary ROS,    breath sounds clear to auscultation       Cardiovascular hypertension,  Rhythm:Regular     Neuro/Psych negative neurological ROS  negative psych ROS   GI/Hepatic Neg liver ROS, GERD  Medicated and Controlled,  Endo/Other  negative endocrine ROS  Renal/GU negative Renal ROS     Musculoskeletal  (+) Arthritis , right femoral neck fracture   Abdominal   Peds  Hematology negative hematology ROS (+)   Anesthesia Other Findings   Reproductive/Obstetrics                             Anesthesia Physical Anesthesia Plan  ASA: II  Anesthesia Plan: General   Post-op Pain Management:    Induction: Intravenous and Rapid sequence  PONV Risk Score and Plan: 4 or greater and Ondansetron, Dexamethasone, Midazolam and Scopolamine patch - Pre-op  Airway Management Planned: Oral ETT  Additional Equipment: None  Intra-op Plan:   Post-operative Plan: Extubation in OR  Informed Consent: I have reviewed the patients History and Physical, chart, labs and discussed the procedure including the risks, benefits and alternatives for the proposed anesthesia with the patient or authorized representative who has indicated his/her understanding and acceptance.   Dental advisory given  Plan Discussed with: CRNA and Surgeon  Anesthesia Plan Comments:         Anesthesia Quick Evaluation

## 2018-10-08 NOTE — Brief Op Note (Signed)
10/08/2018  9:18 PM  PATIENT:  [name]  72 y.o. female  PRE-OPERATIVE DIAGNOSIS:  right femoral neck fracture  POST-OPERATIVE DIAGNOSIS:  right femoral neck fracture  PROCEDURE:  Procedure(s): ARTHROPLASTY UNIPOLAR HIP (HEMIARTHROPLASTY) (Right) WITH DEPUY SUMMIT #3 PRESS FIT STEM, STANDARD NECK, 45 MM HEAD  SURGEON:  Surgeon(s) and Role:    Myrene Galas* Shellye Zandi, MD - Primary  PHYSICIAN ASSISTANT: KEITH PAUL PA-C  ASSISTANTS: 2. PA Student   ANESTHESIA:   general  EBL:  100 cc  BLOOD ADMINISTERED:none  DRAINS: none   LOCAL MEDICATIONS USED:  NONE  SPECIMEN:  No Specimen  DISPOSITION OF SPECIMEN:  N/A  COUNTS:  YES  TOURNIQUET:  * No tourniquets in log *  DICTATION: .Note written in EPIC  PLAN OF CARE: Admit to inpatient   PATIENT DISPOSITION:  PACU - hemodynamically stable.   Delay start of Pharmacological VTE agent (>24hrs) due to surgical blood loss or risk of bleeding: no

## 2018-10-08 NOTE — Progress Notes (Signed)
Pt back from PACU. S/P right hip surgery.Pt easily rouses, Alert and oriented x4.Noted hydrocolloid dsg CDI to right hip with ice pack to area. Abductor pillow in between legs.Posterior hip precautions maintained. Daughter at bedside. Will cont to monitor.

## 2018-10-08 NOTE — Anesthesia Procedure Notes (Signed)
Procedure Name: Intubation Date/Time: 10/08/2018 7:17 PM Performed by: Tillman AbideHawkins, Gearldine Looney B, CRNA Pre-anesthesia Checklist: Patient identified, Emergency Drugs available, Suction available and Patient being monitored Patient Re-evaluated:Patient Re-evaluated prior to induction Oxygen Delivery Method: Circle System Utilized Preoxygenation: Pre-oxygenation with 100% oxygen Induction Type: IV induction Ventilation: Mask ventilation without difficulty Laryngoscope Size: Miller and 2 Grade View: Grade I Tube type: Oral Tube size: 7.0 mm Number of attempts: 1 Airway Equipment and Method: Stylet Placement Confirmation: ETT inserted through vocal cords under direct vision,  positive ETCO2 and breath sounds checked- equal and bilateral Secured at: 22 cm Tube secured with: Tape Dental Injury: Teeth and Oropharynx as per pre-operative assessment

## 2018-10-08 NOTE — Progress Notes (Signed)
Patient arrived via [facility]. Alert and Oriented x4. VSS. MD paged to receive orders on patient. Will continue to monitor.

## 2018-10-08 NOTE — Progress Notes (Signed)
RN verified the presence of a signed informed consent that matches stated procedure by patient. Verified armband matches patient's stated name and birth date. Verified NPO status and that all jewelry, contact, glasses, dentures, and partials had been removed (if applicable). Patient nauseated on arrival.

## 2018-10-08 NOTE — H&P (Signed)
History and Physical    ALEXANDRYA CHIM WUJ:[id number] DOB: 15-Dec-1945 DOA: 10/08/2018  PCP: Gordan Payment., MD Consultants:  Arnetha Gula - GYN Patient coming from:  Home - lives with husband; NOKVinie Sill, [place], [phone]; Lawson Fiscal, [phone]  Chief Complaint: Fall  HPI: [name] is a 72 y.o. female with medical history significant of remote HTN and C diff colitis presenting with a mechanical fall.  She was in the kitchen last night.  She turned and her foot caught and got tangled and it caused her to fall.  She landed directly on her right hip.  She had groin pain extending down to her knee.  Her daughter had to help her up and the patient tried to walk with a walker and she walked out to the car and into the ER.  She had C diff colitis about 2-3 months ago, completing Vanc x 2 courses including a taper.  She then took Wellsite geologist.  Last week she still had intermittent loose stools at times and was concerned.  This week was the best with more formed stools and less frequency.  Her husband is on hospice and has in-home care, suffering from vascular dementia.  ED Course:  Transfer from [facility], Dr. Carola Frost to operate on R hip fracture this AM.  Review of Systems: As per HPI; otherwise review of systems reviewed and negative.   Ambulatory Status:  Ambulates without assistance  Past Medical History:  Diagnosis Date  . Arthritis    oa  . Complication of anesthesia   . Frequent UTI   . GERD (gastroesophageal reflux disease)   . History of Clostridium difficile infection   . PONV (postoperative nausea and vomiting)     Past Surgical History:  Procedure Laterality Date  . APPENDECTOMY    . CERVICAL FUSION     3 4 & 5   . OVARIAN CYST REMOVAL    . SHOULDER SURGERY      Social History   Socioeconomic History  . Marital status: Married    Spouse name: Not on file  . Number of children: Not on file  . Years of education: Not on file  . Highest education level: Not  on file  Occupational History  . Occupation: retired  Engineer, production  . Financial resource strain: Not on file  . Food insecurity:    Worry: Not on file    Inability: Not on file  . Transportation needs:    Medical: Not on file    Non-medical: Not on file  Tobacco Use  . Smoking status: Never Smoker  . Smokeless tobacco: Never Used  Substance and Sexual Activity  . Alcohol use: Never    Frequency: Never  . Drug use: Never  . Sexual activity: Not on file  Lifestyle  . Physical activity:    Days per week: Not on file    Minutes per session: Not on file  . Stress: Not on file  Relationships  . Social connections:    Talks on phone: Not on file    Gets together: Not on file    Attends religious service: Not on file    Active member of club or organization: Not on file    Attends meetings of clubs or organizations: Not on file    Relationship status: Not on file  . Intimate partner violence:    Fear of current or ex partner: Not on file    Emotionally abused: Not on file  Physically abused: Not on file    Forced sexual activity: Not on file  Other Topics Concern  . Not on file  Social History Narrative  . Not on file    Allergies  Allergen Reactions  . Estrogenic Substance Shortness Of Breath    Estrogen cream. Unable to breath per patient  . Influenza Vaccines Anaphylaxis  . Levofloxacin Other (See Comments)    Any fluoroquinolone drugs give her tendonitis Other Reaction: tendonitis   . Tetracycline Shortness Of Breath  . Amoxicillin-Pot Clavulanate Other (See Comments), Nausea Only and Nausea And Vomiting  . Aspirin Other (See Comments)    Other Reaction: GI Upset   . Penicillins Other (See Comments), Nausea Only and Nausea And Vomiting  . Prednisone Other (See Comments) and Nausea Only    "I just throw prednisone back up, but Kenalog has never bothered me"   . Sulfa Antibiotics Nausea Only and Nausea And Vomiting  . Cefaclor Other (See Comments)  .  Cefdinir Diarrhea  . Cephalexin Other (See Comments)  . Ciprofloxacin Other (See Comments)  . Meloxicam Swelling    Tongue swelling  . Metronidazole Other (See Comments)    Severe headache  . Other Other (See Comments)    Any Floxin Drugs - tendonitis   . Solifenacin Other (See Comments)    Unable to void, had to be catheterized  . Gentamicin Rash    History reviewed. No pertinent family history.  Prior to Admission medications   Not on File    Physical Exam: Vitals:   10/08/18 0947  BP: (!) 143/65  Pulse: 63  Temp: 98.8 F (37.1 C)  TempSrc: Oral  SpO2: 100%     General:  Appears calm and comfortable and is NAD Eyes:   EOMI, normal lids, iris ENT:  grossly normal hearing, lips & tongue, mmm Neck:  no LAD, masses or thyromegaly Cardiovascular:  RRR, no m/r/g. No LE edema.  Respiratory:   CTA bilaterally with no wheezes/rales/rhonchi.  Normal respiratory effort. Abdomen:  soft, NT, ND, NABS Skin:  no rash or induration seen on limited exam Musculoskeletal: R leg with mild shortening and with external rotation Lower extremity:  No LE edema.  Limited foot exam with no ulcerations.  2+ distal pulses. Psychiatric:  grossly normal mood and affect, speech fluent and appropriate, AOx3 Neurologic:  CN 2-12 grossly intact, moves all extremities in coordinated fashion other than RLE as expected, sensation intact    Radiological Exams on Admission: Dg Chest Portable 1 View  Result Date: 10/08/2018 CLINICAL DATA:  Preoperative examination prior to hip surgery. No current chest complaints. History of hypertension, never smoked. EXAM: PORTABLE CHEST 1 VIEW COMPARISON:  Portable chest x-ray of October 07, 2018 FINDINGS: The lungs are adequately inflated and clear. The heart and pulmonary vascularity are normal. The mediastinum is normal in width. The trachea is midline. The bony thorax exhibits no acute abnormality. IMPRESSION: There is no active cardiopulmonary disease.  Electronically Signed   By: David  SwazilandJordan M.D.   On: 10/08/2018 12:03    EKG: pending   Labs on Admission: I have personally reviewed the available labs and imaging studies at the time of the admission.  Pertinent labs:   Normal CMP  Platelets 137, otherwise WNL CBC INR 1.15  Assessment/Plan Principal Problem:   Hip fracture requiring operative repair, right, closed, initial encounter [facility](HCC) Active Problems:   Essential hypertension   History of Clostridium difficile infection   Medication intolerance   R hip fracture -Mechanical fall  resulting in acute nondisplaced subcapital fracture of the right femoral neck with slight impaction -Orthopedics consulted -NPO in anticipation of surgical repair later this evening by Dr. Carola Frost -SCDs overnight, start Lovenox post-operatively (or as per ortho) -EKG prior to surgery, pending -Pain control with Robxain, Morphine prn -SW consult for rehab placement -Will need PT consult post-operatively -Hip fracture order set utilized  H/o C diff -Treated with PO Vanc x 1 and then repeat course with taper -Reports improving symptoms -If diarrhea increases, will need repeat C diff testing to ensure clearance  HTN -Previously on medication very remotely but none in years  Medication intolerance -Patient with MANY repeated drug allergies/intolerances -Will need careful attention prior to administration of most medications -Will receive Vanc perioperatively, as she has tolerated this without difficulty in the past -She is able to take Robaxin and morphine but is unsure about Vicodin   DVT prophylaxis:  SCDs until approved for Lovenox by orthopedics Code Status:  DNR - confirmed with patient Family Communication: None present  Disposition Plan:  Home once clinically improved Consults called: Orthopedics; SW, Nutrition; will need PT post-operatively  Admission status: Admit - It is my clinical opinion that admission to INPATIENT is reasonable  and necessary because of the expectation that this patient will require hospital care that crosses at least 2 midnights to treat this condition based on the medical complexity of the problems presented.  Given the aforementioned information, the predictability of an adverse outcome is felt to be significant.    Jonah Blue MD [facility]  If note is complete, please contact covering daytime or nighttime physician. [email or web address] Password [email or web address]  10/08/2018, 2:18 PM

## 2018-10-08 NOTE — Op Note (Signed)
10/08/2018  9:18 PM  PATIENT:  [name]  [age] y.o. female  PRE-OPERATIVE DIAGNOSIS:  right femoral neck fracture  POST-OPERATIVE DIAGNOSIS:  right femoral neck fracture  PROCEDURE:  Procedure(s): ARTHROPLASTY UNIPOLAR HIP (HEMIARTHROPLASTY) (Right) WITH DEPUY SUMMIT #3 PRESS FIT STEM, STANDARD NECK, 45 MM HEAD  SURGEON:  Surgeon(s) and Role:    Myrene Galas* Maelle Sheaffer, MD - Primary  PHYSICIAN ASSISTANT: KEITH PAUL PA-C  ASSISTANTS: 2. PA Student   ANESTHESIA:   general  EBL:  100 cc  BLOOD ADMINISTERED:none  DRAINS: none   LOCAL MEDICATIONS USED:  NONE  SPECIMEN:  No Specimen  DISPOSITION OF SPECIMEN:  N/A  COUNTS:  YES  TOURNIQUET:  * No tourniquets in log *  DICTATION: .Note written in EPIC  PLAN OF CARE: Admit to inpatient   PATIENT DISPOSITION:  PACU - hemodynamically stable.   Delay start of Pharmacological VTE agent (>24hrs) due to surgical blood loss or risk of bleeding: no  BRIEF SUMMARY OF INDICATION FOR PROCEDURE:  [name] is a very pleasant 72 y.o. who sustained a fall producing inability to bear weight, shortening, and external rotation of the extremity.  She was seen and evaluated with the recommendation for hemiarthroplasty. I discussed with the patient and family the risks and benefits, inclding the potential for leg length inequality, dislocation or instability, arthritis, loss of motion, DVT, PE, heart attack, stroke, and death.  Consent was given to proceed.  BRIEF SUMMARY OF PROCEDURE:  The patient was taken to the operating room where general anesthesia was induced and after administration of preoperative antibiotics consisting of 2 g of Vancomycin.  She was positioned with the right side up and all prominences were padded appropriately.  We made a 10 cm incision after the time-out, carrying dissection down to the IT band, was split in line with the skin.  Cerebellar retractor was placed and we were able to then flex and internally  rotate the hip releasing the piriformis and short rotators at their insertions.  The capsule was then T'd, tagging the corners with #1 Vicryl.  The neck cut was refined using a cutting guide and then this was followed by removal of the head, which sized perfectly to 45 mm. Acetabular trials were placed, confirming this size as the best fit. Mueller and Cobra retractors were placed along the proximal femur, which was then prepared with the canal finder,  then lateralizer, followed by reamers up to #4, and the broaches, achieving  outstanding fit and fill with the #3 broach.  The calcar reamer was used to refine the cut And we chose to use a high demand porous coated stem given her age.  The canal was irrigated thoroughly and the acetabulum once again searched multiple times for fragments and irrigated thoroughly.  Trial components were placed and the patient had outstanding stability in combine 90 degrees of flexion, adduction, and internal rotation as well as in external rotation and extension.  Consequently, actual components were placed.  My assistant Montez MoritaKeith Paul, was necessary for delivery and control of the proximal femur during preparation, also during relocation and dislocation of the trial components as well as relocation of the actual components.  He assisted me with wound closure as well.  I did repair the capsule with #1 Vicryl and then used #2 FiberWire through bone tunnels to repair the short rotators and piriformis.  This was followed by a #1 Vicryl for the IT band and lastly 2-0 Vicryl and nylon for the subcutaneous  and skin.  Sterile gently compressive dressing was applied.  The patient was awakened from anesthesia and transported to the PACU in stable condition.  PROGNOSIS:  The patient will be weightbearing as tolerated with posterior hip precautions.  Patient has an elevated risk of complications related to declining overall health and mobility.  [name] remains  on the Medical Service and will be on DVT prophylaxis mechanically and with Lovenox.     [name]. Carola Frost, M.D.

## 2018-10-08 NOTE — Consult Note (Signed)
Orthopaedic Trauma Service (OTS) Consult   Patient ID: [name] MRN: [id number] DOB/AGE: 14-Oct-1946 72 y.o.   Reason for Consult: Right femoral neck fracture Referring Physician: [facility] emergency department   HPI: [name] is an 72 y.o. white female who sustained a ground-level fall in the kitchen yesterday evening.  Patient states that she was in the kitchen and turned to do something in her feet got caught on her and she fell directly on her right hip.  Patient had immediate onset of pain and inability to bear weight.  She eventually presented to the [facility] emergency department where she was found to have a right subcapital femoral neck fracture.  Patient requested transfer to [facility] hospital.  Patient has been transferred to [facility] and has been admitted to the medical service.  Orthopedic trauma service consulted for management of her right femoral neck fracture.  Patient seen and evaluated in room 6 N. 1.  She is laying flat she does complain of some mild low back pain due to being in bed for numerous hours.  Reports her pain is primarily in her left groin as well without any radiation.  Denies any pain elsewhere.  No other injuries of note.  Denies any numbness or tingling in her lower extremities.  No chest pain, shortness of breath, no nausea or vomiting no abdominal pain.  Pain is relieved with rest and pain medication it is aggravated with movement.  Pain is remained pretty much constant since her fall.  Patient lives in a one-story house with her husband who has advanced dementia.  She is primary caregiver but they do have help at home.  Patient does not use any assistive devices prior to her fall.  Patient is active taking care of her husband.  She had numerous hobbies however since her husband's dementia has advanced she is unable to participate in these.   Patient states that her only active medical problem is GERD   Denies any history of  hypertension  Patient did have a C. difficile that was treated with course of oral vancomycin.  She completed this about 3 weeks ago.  Last C. difficile assay was negative which was also about 3 weeks ago by her report   Patient last had a bone density scan about 2 years ago.  This must be from an outside institution as I do not see a report on our system.  She states that she no longer takes vitamin D or calcium believes that the vitamin D was constipating her.   Patient does not smoke, patient does not drink  Again lives in a single-story house with her husband   Patient does have an exhaustive list of allergies/intolerances  Past Medical History:  Diagnosis Date  . Arthritis    oa  . Complication of anesthesia   . Frequent UTI   . GERD (gastroesophageal reflux disease)   . History of Clostridium difficile infection   . PONV (postoperative nausea and vomiting)     Past Surgical History:  Procedure Laterality Date  . APPENDECTOMY    . CERVICAL FUSION     3 4 & 5   . OVARIAN CYST REMOVAL    . SHOULDER SURGERY      History reviewed. No pertinent family history.  Social History:  reports that she has never smoked. She has never used smokeless tobacco. She reports that she does not drink alcohol or use drugs.  Allergies:  Allergies  Allergen Reactions  . Estrogenic Substance Shortness Of Breath    Estrogen cream. Unable to breath per patient  . Influenza Vaccines Anaphylaxis  . Levofloxacin Other (See Comments)    Any fluoroquinolone drugs give her tendonitis Other Reaction: tendonitis   . Tetracycline Shortness Of Breath  . Amoxicillin-Pot Clavulanate Other (See Comments), Nausea Only and Nausea And Vomiting  . Aspirin Other (See Comments)    Other Reaction: GI Upset   . Penicillins Other (See Comments), Nausea Only and Nausea And Vomiting  . Prednisone Other (See Comments) and Nausea Only    "I just throw prednisone back up, but Kenalog has never bothered me"   .  Sulfa Antibiotics Nausea Only and Nausea And Vomiting  . Cefaclor Other (See Comments)  . Cefdinir Diarrhea  . Cephalexin Other (See Comments)  . Ciprofloxacin Other (See Comments)  . Meloxicam Swelling    Tongue swelling  . Metronidazole Other (See Comments)    Severe headache  . Other Other (See Comments)    Any Floxin Drugs - tendonitis   . Solifenacin Other (See Comments)    Unable to void, had to be catheterized  . Gentamicin Rash    Medications: I have reviewed the patient's current medications. Current Meds  Medication Sig  . acetaminophen (TYLENOL) 500 MG tablet Take 1,000 mg by mouth every 6 (six) hours as needed for mild pain.  Marland Kitchen ibuprofen (ADVIL,MOTRIN) 200 MG tablet Take 400 mg by mouth every 6 (six) hours as needed for mild pain.  Marland Kitchen lansoprazole (PREVACID) 30 MG capsule Take 30 mg by mouth every morning.     No results found for this or any previous visit (from the past 48 hour(s)).  No results found.  Review of Systems  Constitutional: Negative for chills and fever.  Respiratory: Negative for shortness of breath and wheezing.   Cardiovascular: Negative for chest pain and palpitations.  Gastrointestinal: Negative for abdominal pain, nausea and vomiting.  Neurological: Negative for tingling and sensory change.   Blood pressure (!) 143/65, pulse 63, temperature 98.8 F (37.1 C), temperature source Oral, SpO2 100 %. Physical Exam  Constitutional: She is oriented to person, place, and time. She is cooperative.  Pleasant white female appears appropriate for stated age  Cardiovascular:  RRR  Pulmonary/Chest:  Clear anterior fields   Abdominal:  Soft, NTND, + BS   Musculoskeletal:  Pelvis      no traumatic wounds or rash, no ecchymosis, stable to manual stress, nontender  Right Lower Extremity  Inspection: No gross deformities to the right leg Rotation and length are maintained No open wounds or lesions  Bony eval: Hip is tender to palpation Thigh,  knee, lower leg, ankle and foot are nontender  Soft tissue: Mild swelling to the right hip No significant ecchymosis appreciated to the soft tissue of the proximal thigh or hip No knee effusion, no ankle effusion No open or traumatic wounds Did not stress her knee Ankle is grossly stable  Sensation: DPN, SPN, TN sensory functions are grossly intact  Motor: EHL, FHL, lesser toe motor functions are grossly intact Anterior tibialis, posterior tibialis, peroneals and gastrocsoleus complex motor functions are grossly intact Patient can perform a quad set  Vascular: Extremity is warm + DP pulse Compartments are soft and nontender, no pain with passive stretching  Left lower extremity             no open wounds or lesions, no swelling or ecchymosis   Nontender hip, knee, ankle and  foot             No crepitus or gross motion noted with manipulation of the left leg  No knee or ankle effusion             No pain with axial loading or logrolling of the hip. Negative Stinchfield test   Knee stable to varus/ valgus and anterior/posterior stress             No pain with manipulation of the ankle or foot             No blocks to motion noted  Sens DPN, SPN, TN intact  Motor EHL, FHL, lesser toe motor, Ext, flex, evers 5/5  DP 2+, PT 2+, No significant edema             Compartments are soft and nontender, no pain with passive stretching  Bilateral upper extremities      shoulder, elbow, wrist, digits- no skin wounds, nontender, no instability, no blocks to motion  Sens  Ax/R/M/U intact  Mot   Ax/ R/ PIN/ M/ AIN/ U intact  Rad 2+   Neurological: She is alert and oriented to person, place, and time.  Skin: Skin is warm and intact.  Psychiatric: She has a normal mood and affect. Her speech is normal and behavior is normal. Thought content normal. Cognition and memory are normal.     Assessment/Plan:  72 year old white female ground-level fall with minimally displaced right  subcapital femoral neck fracture  -Ground-level fall  -Minimally displaced right subcapital femoral neck fracture  Lengthy discussion with patient regarding treatment options including cannulated screw fixation and hip hemiarthroplasty.  Given her medical issues and activity level I think it would be best to proceed with right hip hemiarthroplasty to get the patient back to her pre-functioning status is quick as possible.  I am concerned that if we attempted cannulated screw fixation she will develop nonunion with associated collapse and require hemiarthroplasty in the near future.  She does have to take care of her husband with dementia and I think proceeding with hip hemiarthroplasty will provide [place] with the best and most predictable outcome.  Patient is in agreement with this plan.   Weight-bear as tolerated postoperatively with posterior hip precautions with the use of a walker   Hopeful to get the patient into inpatient rehab prior to discharging to home which is her ultimate goal   Bedrest for now, OR this evening    Ice as needed to right hip for pain and swelling control   Osteoporosis work-up   Bone density scan outpatient   Given patient's medical history, list of allergies and just talking with the patient do not think that she would be all that inclined to taking supplementation or medications for her bone health but we will continue to discuss this with her.  - Pain management:  Titrate accordingly  - ABL anemia/Hemodynamics  CBC is pending  Preop labs including coags and type and screen  - Medical issues   Per primary service  - DVT/PE prophylaxis:  Lovenox x30 days postoperatively  - ID:   Will use vancomycin perioperatively  - Metabolic Bone Disease:  As above  - Activity:  Bedrest for now  PT and OT consults postoperatively  Weight-bear as tolerated postoperatively with posterior hip precautions  - FEN/GI prophylaxis/Foley/Lines:  NPO  PPI   IVF   Will  place foley in OR, dc on POD 1  - Impediments to fracture healing:  Osteoporosis   - Dispo:  OR this afternoon for R hip hemiarthroplasty     Mearl LatinKeith W. Dangelo Guzzetta, PA-C [phone] (C) 10/08/2018, 11:10 AM  [facility] [address] [place] [address] [phone] Collier Bullock(O) [phone] (F)

## 2018-10-08 NOTE — Transfer of Care (Signed)
Immediate Anesthesia Transfer of Care Note  Patient: [name]  Procedure(s) Performed: ARTHROPLASTY BIPOLAR HIP (HEMIARTHROPLASTY) (Right Hip)  Patient Location: PACU  Anesthesia Type:General  Level of Consciousness: awake, drowsy and patient cooperative  Airway & Oxygen Therapy: Patient Spontanous Breathing  Post-op Assessment: Report given to RN and Post -op Vital signs reviewed and stable  Post vital signs: Reviewed and stable  Last Vitals:  Vitals Value Taken Time  BP    Temp    Pulse 95 10/08/2018  9:44 PM  Resp 9 10/08/2018  9:44 PM  SpO2 90 % 10/08/2018  9:44 PM  Vitals shown include unvalidated device data.  Last Pain:  Vitals:   10/08/18 0947  TempSrc: Oral  PainSc:          Complications: No apparent anesthesia complications

## 2018-10-09 ENCOUNTER — Encounter (HOSPITAL_COMMUNITY): Payer: Self-pay | Admitting: Orthopedic Surgery

## 2018-10-09 DIAGNOSIS — I10 Essential (primary) hypertension: Secondary | ICD-10-CM

## 2018-10-09 DIAGNOSIS — G8918 Other acute postprocedural pain: Secondary | ICD-10-CM

## 2018-10-09 DIAGNOSIS — R739 Hyperglycemia, unspecified: Secondary | ICD-10-CM

## 2018-10-09 DIAGNOSIS — Z8619 Personal history of other infectious and parasitic diseases: Secondary | ICD-10-CM

## 2018-10-09 DIAGNOSIS — M159 Polyosteoarthritis, unspecified: Secondary | ICD-10-CM

## 2018-10-09 DIAGNOSIS — T380X5A Adverse effect of glucocorticoids and synthetic analogues, initial encounter: Secondary | ICD-10-CM

## 2018-10-09 DIAGNOSIS — D72829 Elevated white blood cell count, unspecified: Secondary | ICD-10-CM

## 2018-10-09 DIAGNOSIS — S72001A Fracture of unspecified part of neck of right femur, initial encounter for closed fracture: Secondary | ICD-10-CM

## 2018-10-09 LAB — CBC
HCT: 40 % (ref 36.0–46.0)
Hemoglobin: 12.4 g/dL (ref 12.0–15.0)
MCH: 27.3 pg (ref 26.0–34.0)
MCHC: 31 g/dL (ref 30.0–36.0)
MCV: 87.9 fL (ref 80.0–100.0)
Platelets: 137 10*3/uL — ABNORMAL LOW (ref 150–400)
RBC: 4.55 MIL/uL (ref 3.87–5.11)
RDW: 13.5 % (ref 11.5–15.5)
WBC: 11.1 10*3/uL — AB (ref 4.0–10.5)
nRBC: 0 % (ref 0.0–0.2)

## 2018-10-09 LAB — BASIC METABOLIC PANEL
Anion gap: 9 (ref 5–15)
BUN: 8 mg/dL (ref 8–23)
CHLORIDE: 103 mmol/L (ref 98–111)
CO2: 24 mmol/L (ref 22–32)
Calcium: 8.6 mg/dL — ABNORMAL LOW (ref 8.9–10.3)
Creatinine, Ser: 0.94 mg/dL (ref 0.44–1.00)
GFR calc non Af Amer: >60 mL/min (ref >60)
Glucose, Bld: 127 mg/dL — ABNORMAL HIGH (ref 70–99)
POTASSIUM: 3.7 mmol/L (ref 3.5–5.1)
SODIUM: 136 mmol/L (ref 135–145)

## 2018-10-09 LAB — VITAMIN D 25 HYDROXY (VIT D DEFICIENCY, FRACTURES): Vit D, 25-Hydroxy: 19.9 ng/mL — ABNORMAL LOW (ref 30.0–100.0)

## 2018-10-09 LAB — CALCITRIOL (1,25 DI-OH VIT D): Vit D, 1,25-Dihydroxy: 36.9 pg/mL (ref 19.9–79.3)

## 2018-10-09 MED ORDER — ALUM & MAG HYDROXIDE-SIMETH 200-200-20 MG/5ML PO SUSP
15.0000 mL | Freq: Four times a day (QID) | ORAL | Status: DC | PRN
  Administered 2018-10-09 – 2018-10-12 (×3): 15 mL via ORAL
  Filled 2018-10-09 (×3): qty 30

## 2018-10-09 NOTE — Social Work (Signed)
CSW acknowledging consult for SNF placement. Will follow for therapy recommendations.   Nicolus Ose, MSW, LCSWA [facility] [phone]   

## 2018-10-09 NOTE — Evaluation (Signed)
Physical Therapy Evaluation Patient Details Name: [name] MRN: [id number] DOB: 03/05/1946 Today's Date: 10/09/2018   History of Present Illness  Pt is a 72 y/o female admitted following fall. Found to have R hip fracture, and is s/p R hip hemiarthroplasty. PMH includes HTN and cervical fusion.   Clinical Impression  Pt is s/p surgery above with deficits below. Pt limited this session secondary to dizziness and nausea. Required mod A to transfer to chair using RW this session. Educated about posterior hip precautions and PWB precautions on RLE. Feel pt would be excellent candidate for CIR, as she is very motivated to regain independence, so she can go home. Will continue to follow acutely to maximize functional mobility independence and safety.     Follow Up Recommendations CIR;Supervision for mobility/OOB    Equipment Recommendations  Rolling walker with 5" wheels;3in1 (PT)    Recommendations for Other Services Rehab consult;OT consult     Precautions / Restrictions Precautions Precautions: Posterior Hip Precaution Booklet Issued: No Precaution Comments: Verbally reviewed posterior hip precautions.  Restrictions Weight Bearing Restrictions: Yes RLE Weight Bearing: Partial weight bearing RLE Partial Weight Bearing Percentage or Pounds: 50      Mobility  Bed Mobility Overal bed mobility: Needs Assistance Bed Mobility: Supine to Sit     Supine to sit: Mod assist     General bed mobility comments: Mod A for RLE assist and assist with scooting hips to EOB.   Transfers Overall transfer level: Needs assistance Equipment used: Rolling walker (2 wheeled) Transfers: Sit to/from [employer];Stand Pivot Transfers Sit to Stand: Mod assist;From elevated surface Stand pivot transfers: Min assist;Mod assist       General transfer comment: Mod A for lift assist and steadying. Cues for safe hand placement. Pt with limited weightshift to RLE secondary to pain. Pt with increased  dizziness and nausea as well so mobility limited to stand pivot to recliner. Min to mod A for steadying assist. Cues for sequencing using RW.   Ambulation/Gait                Stairs            Wheelchair Mobility    Modified Rankin (Stroke Patients Only)       Balance Overall balance assessment: Needs assistance Sitting-balance support: No upper extremity supported;Feet supported Sitting balance-Leahy Scale: Fair     Standing balance support: Bilateral upper extremity supported;During functional activity Standing balance-Leahy Scale: Poor Standing balance comment: Reliant on BUE support                              Pertinent Vitals/Pain Pain Assessment: Faces Faces Pain Scale: Hurts even more Pain Location: R hip  Pain Descriptors / Indicators: Aching;Operative site guarding Pain Intervention(s): Limited activity within patient's tolerance;Monitored during session;Repositioned    Home Living Family/patient expects to be discharged to:: Inpatient rehab Living Arrangements: Spouse/significant other   Type of Home: House Home Access: Stairs to enter Entrance Stairs-Rails: None Entrance Stairs-Number of Steps: 2 Home Layout: One level   Additional Comments: Pt's husband with dementia     Prior Function Level of Independence: Independent               Hand Dominance        Extremity/Trunk Assessment   Upper Extremity Assessment Upper Extremity Assessment: Defer to OT evaluation    Lower Extremity Assessment Lower Extremity Assessment: RLE deficits/detail RLE Deficits / Details: Deficits consistent  with post op pain and weakness.     Cervical / Trunk Assessment Cervical / Trunk Assessment: Kyphotic  Communication   Communication: No difficulties  Cognition Arousal/Alertness: Awake/alert Behavior During Therapy: WFL for tasks assessed/performed Overall Cognitive Status: Within Functional Limits for tasks assessed                                         General Comments General comments (skin integrity, edema, etc.): Pt's daughter present throughout session.     Exercises Total Joint Exercises Ankle Circles/Pumps: AROM;Both;20 reps   Assessment/Plan    PT Assessment Patient needs continued PT services  PT Problem List Decreased strength;Decreased balance;Decreased mobility;Decreased knowledge of use of DME;Decreased knowledge of precautions;Pain       PT Treatment Interventions DME instruction;Gait training;Functional mobility training;Therapeutic activities;Therapeutic exercise;Balance training;Patient/family education    PT Goals (Current goals can be found in the Care Plan section)  Acute Rehab PT Goals Patient Stated Goal: to get stronger before going home  PT Goal Formulation: With patient Time For Goal Achievement: 10/23/18 Potential to Achieve Goals: Good    Frequency Min 5X/week   Barriers to discharge        Co-evaluation               AM-PAC PT "6 Clicks" Mobility  Outcome Measure Help needed turning from your back to your side while in a flat bed without using bedrails?: A Lot Help needed moving from lying on your back to sitting on the side of a flat bed without using bedrails?: A Lot Help needed moving to and from a bed to a chair (including a wheelchair)?: A Lot Help needed standing up from a chair using your arms (e.g., wheelchair or bedside chair)?: A Lot Help needed to walk in hospital room?: A Lot Help needed climbing 3-5 steps with a railing? : A Lot 6 Click Score: 12    End of Session Equipment Utilized During Treatment: Gait belt Activity Tolerance: Treatment limited secondary to medical complications (Comment)(dizziness ) Patient left: in chair;with call bell/phone within reach;with chair alarm set Nurse Communication: Mobility status PT Visit Diagnosis: Unsteadiness on feet (R26.81);Muscle weakness (generalized) (M62.81);Pain Pain - Right/Left:  Right Pain - part of body: Hip    Time: 1011-1040 PT Time Calculation (min) (ACUTE ONLY): 29 min   Charges:   PT Evaluation $PT Eval Moderate Complexity: 1 Mod PT Treatments $Therapeutic Activity: 8-22 mins        Gladys Damme, PT, DPT  Acute Rehabilitation Services  Pager: [phone] Office: [phone]   Lehman Prom 10/09/2018, 1:06 PM

## 2018-10-09 NOTE — Plan of Care (Signed)

## 2018-10-09 NOTE — Progress Notes (Signed)
Rehab Admissions Coordinator Note:  Patient was screened by Clois DupesBoyette, Laylynn Campanella Godwin for appropriateness for an Inpatient Acute Rehab Consult per PT recommendation. Patient is Medicare and I question if this is an ortho bundle. Dr. Magdalene PatriciaHandy's service, I request to clarify before we can proceed.Rehab venue will depend on ortho recommendations. And guidance. Ottie Glazier.  Dashae Wilcher, RN, MSN Rehab Admissions Coordinator [phone] 10/09/2018 1:47 PM

## 2018-10-09 NOTE — Progress Notes (Signed)
Ortho Tech called for overhead trapeze frame, they will put it up in the morning when pt is not sleeping.

## 2018-10-09 NOTE — Progress Notes (Addendum)
Pt noted bladder scan 824ml, only spontaeously voided 200ml. Per order to insert foley if bladder scan greater than 500ml. Foley cath Fr14 inserted @0650 . Pt tolerated procedure well. Noted with approx 300ml urine return in the foley bag.

## 2018-10-09 NOTE — Consult Note (Signed)
Physical Medicine and Rehabilitation Consult   Reason for Consult: Functional deficits due to right hip fracture Referring Physician: Dr. Carola FrostHandy   HPI: [name] is a 72 y.o. female with history of OA, frequent UTIs, C. difficile colitis-- currently on ? IV vancomycin taper who tripped and fell night prior to admission onto right hip with onset of pain extending to her knee.  History taken from chart review, patient, and daughter.  [facility] her right femoral neck fracture..  She underwent right unipolar hip arthroplasty by Dr. Carola FrostHandy on 10/08/2018.  Postop to be WBAT and on Lovenox for DVT prophylaxis.  Labs today shows evidence of leukocytosis.  Hospital course complicated by postoperative pain.  PT evaluations done today revealing functional deficits.  CIR recommended for follow-up therapy   Review of Systems  Gastrointestinal: Positive for constipation.  Musculoskeletal: Positive for joint pain and myalgias.  Neurological: Positive for weakness. Negative for sensory change.  All other systems reviewed and are negative.     Past Medical History:  Diagnosis Date  . Arthritis    oa  . Complication of anesthesia   . Frequent UTI   . GERD (gastroesophageal reflux disease)   . History of Clostridium difficile infection   . PONV (postoperative nausea and vomiting)     Past Surgical History:  Procedure Laterality Date  . APPENDECTOMY    . CERVICAL FUSION     3 4 & 5   . HIP ARTHROPLASTY Right 10/08/2018   Procedure: ARTHROPLASTY BIPOLAR HIP (HEMIARTHROPLASTY);  Surgeon: Myrene GalasHandy, Michael, MD;  [facility];  Service: Orthopedics;  Laterality: Right;  . OVARIAN CYST REMOVAL    . SHOULDER SURGERY      History reviewed. No pertinent family history.    Social History: Married.  Husband has dementia and is a hospice patient.  Reports that she has never smoked. She has never used smokeless  tobacco. She reports that she does not drink alcohol or use drugs.   Allergies  Allergen Reactions  . Estrogenic Substance Shortness Of Breath    Estrogen cream. Unable to breath per patient  . Influenza Vaccines Anaphylaxis  . Levofloxacin Other (See Comments)    Any fluoroquinolone drugs give her tendonitis Other Reaction: tendonitis   . Tetracycline Shortness Of Breath  . Amoxicillin-Pot Clavulanate Other (See Comments), Nausea Only and Nausea And Vomiting  . Aspirin Other (See Comments)    Other Reaction: GI Upset   . Penicillins Other (See Comments), Nausea Only and Nausea And Vomiting  . Prednisone Other (See Comments) and Nausea Only    "I just throw prednisone back up, but Kenalog has never bothered me"   . Sulfa Antibiotics Nausea Only and Nausea And Vomiting  . Cefaclor Other (See Comments)  . Cefdinir Diarrhea  . Cephalexin Other (See Comments)  . Ciprofloxacin Other (See Comments)  . Meloxicam Swelling    Tongue swelling  . Metronidazole Other (See Comments)    Severe headache  . Other Other (See Comments)    Any Floxin Drugs - tendonitis   . Solifenacin Other (See Comments)    Unable to void, had to be catheterized  . Gentamicin Rash   Medications Prior to Admission  Medication Sig Dispense Refill  . acetaminophen (TYLENOL) 500 MG tablet Take 1,000 mg by mouth every 6 (six) hours as needed for mild pain.    Marland Kitchen. ibuprofen (ADVIL,MOTRIN) 200 MG tablet  Take 400 mg by mouth every 6 (six) hours as needed for mild pain.    Marland Kitchen lansoprazole (PREVACID) 30 MG capsule Take 30 mg by mouth every morning.  11    Home: Home Living Family/patient expects to be discharged to:: Inpatient rehab Living Arrangements: Spouse/significant other Type of Home: House Home Access: Stairs to enter Entrance Stairs-Number of Steps: 2 Entrance Stairs-Rails: None Home Layout: One level Additional Comments: Pt's husband with dementia   Functional History: Prior Function Level of  Independence: Independent Functional Status:  Mobility: Bed Mobility Overal bed mobility: Needs Assistance Bed Mobility: Supine to Sit Supine to sit: Mod assist General bed mobility comments: Mod A for RLE assist and assist with scooting hips to EOB.  Transfers Overall transfer level: Needs assistance Equipment used: Rolling walker (2 wheeled) Transfers: Sit to/from Stand, [employer] Transfers Sit to Stand: Mod assist, From elevated surface Stand pivot transfers: Min assist, Mod assist General transfer comment: Mod A for lift assist and steadying. Cues for safe hand placement. Pt with limited weightshift to RLE secondary to pain. Pt with increased dizziness and nausea as well so mobility limited to stand pivot to recliner. Min to mod A for steadying assist. Cues for sequencing using RW.       ADL:    Cognition: Cognition Overall Cognitive Status: Within Functional Limits for tasks assessed Orientation Level: Oriented X4 Cognition Arousal/Alertness: Awake/alert Behavior During Therapy: WFL for tasks assessed/performed Overall Cognitive Status: Within Functional Limits for tasks assessed  Blood pressure (!) 127/58, pulse 86, temperature 98 F (36.7 C), temperature source Oral, resp. rate 16, height 5' 4.02" (1.626 m), weight 78 kg, SpO2 96 %. Physical Exam  Vitals reviewed. Constitutional: She is oriented to person, place, and time. She appears well-developed and well-nourished.  HENT:  Head: Normocephalic and atraumatic.  Eyes: EOM are normal.  Neck: Normal range of motion. Neck supple.  Cardiovascular: Normal rate and regular rhythm.  Respiratory: Effort normal and breath sounds normal.  GI: Soft. Bowel sounds are normal.  Musculoskeletal:  Right thigh edema and tenderness  Neurological: She is alert and oriented to person, place, and time.  Motor: Bilateral upper extremities: 5/5 proximal distal Left lower extremity: Hip flexion 4/5, knee extension 4+/5, ankle  dorsiflexion 5/5 Right lower extremity: Hip flexion 2/5, knee extension 4 medicine/5, ankle dorsiflexion 4+/5 Sensation intact light touch  Skin: Skin is warm and dry.  Right thigh with dressing C/D/I  Psychiatric:  Mildly slowed with slight delay in processing    Results for orders placed or performed during the hospital encounter of 10/08/18 (from the past 24 hour(s))  CBC     Status: Abnormal   Collection Time: 10/09/18  5:27 AM  Result Value Ref Range   WBC 11.1 (H) 4.0 - 10.5 K/uL   RBC 4.55 3.87 - 5.11 MIL/uL   Hemoglobin 12.4 12.0 - 15.0 g/dL   HCT 16.1 09.6 - 04.5 %   MCV 87.9 80.0 - 100.0 fL   MCH 27.3 26.0 - 34.0 pg   MCHC 31.0 30.0 - 36.0 g/dL   RDW 40.9 81.1 - 91.4 %   Platelets 137 (L) 150 - 400 K/uL   nRBC 0.0 0.0 - 0.2 %  Basic metabolic panel     Status: Abnormal   Collection Time: 10/09/18  5:27 AM  Result Value Ref Range   Sodium 136 135 - 145 mmol/L   Potassium 3.7 3.5 - 5.1 mmol/L   Chloride 103 98 - 111 mmol/L   CO2  24 22 - 32 mmol/L   Glucose, Bld 127 (H) 70 - 99 mg/dL   BUN 8 8 - 23 mg/dL   Creatinine, Ser 1.09 0.44 - 1.00 mg/dL   Calcium 8.6 (L) 8.9 - 10.3 mg/dL   GFR calc non Af Amer >60 >60 mL/min   GFR calc Af Amer >60 >60 mL/min   Anion gap 9 5 - 15   Dg Chest Portable 1 View  Result Date: 10/08/2018 CLINICAL DATA:  Preoperative examination prior to hip surgery. No current chest complaints. History of hypertension, never smoked. EXAM: PORTABLE CHEST 1 VIEW COMPARISON:  Portable chest x-ray of October 07, 2018 FINDINGS: The lungs are adequately inflated and clear. The heart and pulmonary vascularity are normal. The mediastinum is normal in width. The trachea is midline. The bony thorax exhibits no acute abnormality. IMPRESSION: There is no active cardiopulmonary disease. Electronically Signed   By: David  [place] M.D.   On: 10/08/2018 12:03   Dg Hip Port Unilat With Pelvis 1v Right  Result Date: 10/09/2018 CLINICAL DATA:  Postop total right hip  replacement. EXAM: DG HIP (WITH OR WITHOUT PELVIS) 1V PORT RIGHT COMPARISON:  Preoperative radiograph yesterday. FINDINGS: Unipolar right hip arthroplasty in expected alignment. No periprosthetic lucency or fracture. Recent postsurgical change includes air in soft tissue edema in the subcutaneous tissues. IMPRESSION: Right hip arthroplasty without immediate postoperative complication. Electronically Signed   By: Narda Rutherford M.D.   On: 10/09/2018 01:51    Assessment/Plan: Diagnosis: Right femoral necks fracture status post right hip arthroplasty Labs independently reviewed.  Records reviewed and summated above. Oral pharmacological pain control  Bowel program: consider colace, miralax and/or Senna. PRN suppository Fall precautions Monitor surgical wound and skin especially over pressure sensitive areas Prevent immobility complications: pressure ulcers, contractures, HO Consider modalities, such as TENs PT/OT consults for mobility strengthening, endurance training and adaptive ADLs    1. Does the need for close, 24 hr/day medical supervision in concert with the patient's rehab needs make it unreasonable for this patient to be served in a less intensive setting? Potentially  2. Co-Morbidities requiring supervision/potential complications: OA (ensure pain does not limit therapies), frequent UTIs, C. difficile colitis (? D/c IV vancomycin), leukocytosis (repeat labs, cont to monitor for signs and symptoms of infection, further workup if indicated), HTN (monitor and provide prns in accordance with increased physical exertion and pain), steroid-induced hyperglycemia (Monitor in accordance with exercise and adjust meds as necessary), postoperative pain (Biofeedback training with therapies to help reduce reliance on opiate and IV pain medications, particularly IV Toradol, monitor pain control during therapies, and sedation at rest and titrate to maximum efficacy to ensure participation and gains in  therapies) 3. Due to bladder management, bowel management, safety, skin/wound care, disease management, pain management and patient education, does the patient require 24 hr/day rehab nursing? Yes 4. Does the patient require coordinated care of a physician, rehab nurse, PT (1-2 hrs/day, 5 days/week) and OT (1-2 hrs/day, 5 days/week) to address physical and functional deficits in the context of the above medical diagnosis(es)? Yes Addressing deficits in the following areas: balance, endurance, locomotion, strength, transferring, bathing, dressing, toileting and psychosocial support 5. Can the patient actively participate in an intensive therapy program of at least 3 hrs of therapy per day at least 5 days per week? Yes 6. The potential for patient to make measurable gains while on inpatient rehab is excellent 7. Anticipated functional outcomes upon discharge from inpatient rehab are supervision and min assist  with PT,  supervision and min assist with OT, n/a with SLP. 8. Estimated rehab length of stay to reach the above functional goals is: 12-15 days. 9. Anticipated D/C setting: Other 10. Anticipated post D/C treatments: HH therapy and Home excercise program 11. Overall Rehab/Functional Prognosis: good  RECOMMENDATIONS: This patient's condition is appropriate for continued rehabilitative care in the following setting: Potentially CIR.  Will need to inquire about caregiver support as husband will be in hospice and patient will be returning home alone. Patient has agreed to participate in recommended program. Yes Note that insurance prior authorization may be required for reimbursement for recommended care.  Comment: Rehab Admissions Coordinator to follow up.   I have personally performed a face to face diagnostic evaluation, including, but not limited to relevant history and physical exam findings, of this patient and developed relevant assessment and plan.  Additionally, I have reviewed and concur  with the physician assistant's documentation above.   Maryla Morrow, MD, ABPMR Jacquelynn Cree, PA-C 10/09/2018

## 2018-10-09 NOTE — Care Management Note (Signed)
Case Management Note  Patient Details  Name: [name] MRN: [id number] Date of Birth: 11/23/1945  Subjective/Objective:                    Action/Plan:  Received consult for home health needs and DME. Await PT recommendations and MD orders.   Patient from home with husband. Expected Discharge Date:                  Expected Discharge Plan:     In-House Referral:     Discharge planning Services  CM Consult  Post Acute Care Choice:  Durable Medical Equipment, Home Health Choice offered to:     DME Arranged:    DME Agency:     HH Arranged:    HH Agency:     Status of Service:  In process, will continue to follow  If discussed at Long Length of Stay Meetings, dates discussed:    Additional Comments:  Kingsley PlanWile, Joseline Mccampbell Marie, RN 10/09/2018, 8:13 AM

## 2018-10-09 NOTE — Progress Notes (Signed)
Orthopaedic Trauma Service (OTS)  1 Day Post-Op Procedure(s) (LRB): ARTHROPLASTY BIPOLAR HIP (HEMIARTHROPLASTY) (Right)  Subjective: Patient reports pain as mild.  Getting to bedside commode on evaluation.  Objective: Current Vitals Blood pressure (!) 155/73, pulse 86, temperature 97.6 F (36.4 C), temperature source Oral, resp. rate 12, height 5' 4.02" (1.626 m), weight 78 kg, SpO2 100 %. Vital signs in last 24 hours: Temp:  [97.5 F (36.4 C)-97.6 F (36.4 C)] 97.6 F (36.4 C) (11/27 0458) Pulse Rate:  [79-95] 86 (11/27 0458) Resp:  [9-12] 12 (11/26 2200) BP: (155-168)/(69-79) 155/73 (11/27 0458) SpO2:  [90 %-100 %] 100 % (11/27 0458) Weight:  [78 kg] 78 kg (11/27 0841)  Intake/Output from previous day: 11/26 0701 - 11/27 0700 In: 1260 [P.O.:60; I.V.:1000; IV Piggyback:200] Out: 400 [Urine:200; Blood:200]  LABS Recent Labs    10/08/18 1147 10/09/18 0527  HGB 12.8 12.4   Recent Labs    10/08/18 1147 10/09/18 0527  WBC 8.0 11.1*  RBC 4.74 4.55  HCT 41.8 40.0  PLT 137* 137*   Recent Labs    10/08/18 1147 10/09/18 0527  NA 140 136  K 3.5 3.7  CL 106 103  CO2 27 24  BUN 9 8  CREATININE 0.95 0.94  GLUCOSE 97 127*  CALCIUM 8.9 8.6*   Recent Labs    10/08/18 1147  INR 1.15     Physical Exam Per patient and daughter reports; getting on bedside commode so was unable to ascertain directly! RLE  Dressing intact, clean  Edema/ swelling controlled  Sens: DPN, SPN, TN intact  Motor: EHL, FHL, and lessor toe ext and flex all intact grossly  Brisk cap refill, warm to touch  Assessment/Plan: 1 Day Post-Op Procedure(s) (LRB): ARTHROPLASTY BIPOLAR HIP (HEMIARTHROPLASTY) (Right) 1. PT/OT post hip precautions 2. DVT proph Lovenox 3. F/u 8-14 days  Myrene GalasMichael Zarria Towell, MD [facility] [phone])

## 2018-10-09 NOTE — Progress Notes (Signed)
Initial Nutrition Assessment  DOCUMENTATION CODES:   Not applicable  INTERVENTION:   -Ensure Enlive po BID, each supplement provides 350 kcal and 20 grams of protein -MVI with minerals daily  NUTRITION DIAGNOSIS:   Increased nutrient needs related to post-op healing as evidenced by estimated needs.  GOAL:   Patient will meet greater than or equal to 90% of their needs  MONITOR:   PO intake, Supplement acceptance, Labs, Weight trends, Skin, I & O's  REASON FOR ASSESSMENT:   Consult Assessment of nutrition requirement/status  ASSESSMENT:   [name] is a 72 y.o. female with medical history significant of remote HTN and C diff colitis presenting with a mechanical fall.  She was in the kitchen last night.  She turned and her foot caught and got tangled and it caused her to fall.  She landed directly on her right hip.  She had groin pain extending down to her knee.  Her daughter had to help her up and the patient tried to walk with a walker and she walked out to the car and into the ER.  Pt admitted with rt hip fracture.   11/26- Procedure(s): ARTHROPLASTY UNIPOLAR HIP (HEMIARTHROPLASTY) (Right) WITH DEPUY SUMMIT #3 PRESS FIT STEM, STANDARD NECK, 45 MM HEAD  Attempted to see pt x 4, however, pt either in with therapy or receiving nursing care at times of visits.   Case discussed with RN, who reports pt is making good progress and eating and drinking well. She reports pt consumed a good breakfast this morning.  Reviewed wt hx from [facility]. Pt has experienced a 5% wt loss over the past 3 months, which is not significant for time frame.   Per therapy notes, pt may d/c to CIR once medically stable.   Labs reviewed.   NUTRITION - FOCUSED PHYSICAL EXAM:    Most Recent Value  Orbital Region  Unable to assess  Upper Arm Region  Unable to assess  Thoracic and Lumbar Region  Unable to assess  Buccal Region  Unable to assess  Temple Region  Unable to assess  Clavicle  Bone Region  Unable to assess  Clavicle and Acromion Bone Region  Unable to assess  Scapular Bone Region  Unable to assess  Dorsal Hand  Unable to assess  Patellar Region  Unable to assess  Anterior Thigh Region  Unable to assess  Posterior Calf Region  Unable to assess  Edema (RD Assessment)  Unable to assess  Hair  Unable to assess  Eyes  Unable to assess  Mouth  Unable to assess  Skin  Unable to assess  Nails  Unable to assess       Diet Order:   Diet Order            Diet regular Room service appropriate? Yes; Fluid consistency: Thin  Diet effective now              EDUCATION NEEDS:   No education needs have been identified at this time  Skin:  Skin Assessment: Skin Integrity Issues: Skin Integrity Issues:: Incisions Incisions: closed rt hip  Last BM:  10/07/18  Height:   Ht Readings from Last 1 Encounters:  10/09/18 5' 4.02" (1.626 m)    Weight:   Wt Readings from Last 1 Encounters:  10/09/18 78 kg    Ideal Body Weight:  54.5 kg  BMI:  Body mass index is 29.51 kg/m.  Estimated Nutritional Needs:   Kcal:  1700-1900  Protein:  90-105 grams  Fluid:  1.7-1.9 L    Dusan Lipford A. Jimmye Norman, RD, LDN, CDE Pager: [phone] After hours Pager: [phone]

## 2018-10-09 NOTE — Progress Notes (Signed)
PROGRESS NOTE    [name]  ZOX:[id number] DOB: 03/08/1946 DOA: 10/08/2018 PCP: Gordan PaymentGrisso, Greg A., MD  Brief Narrative: Alicia Chenn B Nicks is a 72 y.o. female with medical history significant of remote HTN and C diff colitis presenting with a mechanical fall.  She was in the kitchen last night.  She turned and her foot caught and got tangled and it caused her to fall.  She landed directly on her right hip.  She had groin pain extending down to her knee.  Her daughter had to help her up and the patient tried to walk with a walker and she walked out to the car and into the ER   Assessment & Plan:   R hip fracture -Mechanical fall resulting in acute nondisplaced subcapital fracture of the right femoral neck with slight impaction -Orthopedics consulted, s/p Hemiarthroplasty 11/26 -Start Lovenox for DVT prophylaxis -PT/OT evaluation  H/o C diff -Treated with PO Vanc x 1 and then repeat course with taper -No symptoms of recurrence at this time  Urinary retention -Perioperative anesthesia and narcotics -Discontinue Foley catheter, ambulate, PT eval  HTN -Stable, monitor  Medication intolerance -Patient with MANY repeated drug allergies/intolerances   DVT prophylaxis:  lovenox Code Status:  DNR - confirmed with patient Family Communication: None present  Disposition Plan:  Home once clinically improved   Consultants:   Ortho  Procedures: 11/26  Procedure(s):  ARTHROPLASTY UNIPOLAR HIP (HEMIARTHROPLASTY) (Right) WITH DEPUY SUMMIT #3 PRESS FIT STEM, STANDARD NECK, 45 MM HEAD  Antimicrobials:    Subjective: -Okay, slight discomfort in the right hip  Objective: Vitals:   10/08/18 2308 10/09/18 0458 10/09/18 0841 10/09/18 1347  BP: (!) 164/79 (!) 155/73  (!) 127/58  Pulse: 79 86  86  Resp:    16  Temp: (!) 97.5 F (36.4 C) 97.6 F (36.4 C)  98 F (36.7 C)  TempSrc: Oral Oral  Oral  SpO2: 98% 100%  96%  Weight:   78 kg   Height:   5' 4.02" (1.626 m)      Intake/Output Summary (Last 24 hours) at 10/09/2018 1435 Last data filed at 10/09/2018 0521 Gross per 24 hour  Intake 1260 ml  Output 400 ml  Net 860 ml   Filed Weights   10/09/18 0841  Weight: 78 kg    Examination:  General exam: Appears calm and comfortable, no distress Respiratory system: Clear to auscultation Cardiovascular system: S1 & S2 heard, RRR Gastrointestinal system: Abdomen is nondistended, soft and nontender.Normal bowel sounds heard. Central nervous system: Alert and oriented. No focal neurological deficits. Extremities: R hip with dressing Skin: No rashes, lesions or ulcers Psychiatry: Judgement and insight appear normal. Mood & affect appropriate.     Data Reviewed:   CBC: Recent Labs  Lab 10/08/18 1147 10/09/18 0527  WBC 8.0 11.1*  NEUTROABS 6.0  --   HGB 12.8 12.4  HCT 41.8 40.0  MCV 88.2 87.9  PLT 137* 137*   Basic Metabolic Panel: Recent Labs  Lab 10/08/18 1147 10/09/18 0527  NA 140 136  K 3.5 3.7  CL 106 103  CO2 27 24  GLUCOSE 97 127*  BUN 9 8  CREATININE 0.95 0.94  CALCIUM 8.9 8.6*   GFR: Estimated Creatinine Clearance: 54.7 mL/min (by C-G formula based on SCr of 0.94 mg/dL). Liver Function Tests: Recent Labs  Lab 10/08/18 1147  AST 16  ALT 13  ALKPHOS 66  BILITOT 0.9  PROT 6.7  ALBUMIN 3.6   No results for input(s):  LIPASE, AMYLASE in the last 168 hours. No results for input(s): AMMONIA in the last 168 hours. Coagulation Profile: Recent Labs  Lab 10/08/18 1147  INR 1.15   Cardiac Enzymes: No results for input(s): CKTOTAL, CKMB, CKMBINDEX, TROPONINI in the last 168 hours. BNP (last 3 results) No results for input(s): PROBNP in the last 8760 hours. HbA1C: No results for input(s): HGBA1C in the last 72 hours. CBG: No results for input(s): GLUCAP in the last 168 hours. Lipid Profile: No results for input(s): CHOL, HDL, LDLCALC, TRIG, CHOLHDL, LDLDIRECT in the last 72 hours. Thyroid Function Tests: No  results for input(s): TSH, T4TOTAL, FREET4, T3FREE, THYROIDAB in the last 72 hours. Anemia Panel: No results for input(s): VITAMINB12, FOLATE, FERRITIN, TIBC, IRON, RETICCTPCT in the last 72 hours. Urine analysis:    Component Value Date/Time   COLORURINE YELLOW 05/10/2009 1242   APPEARANCEUR HAZY (A) 05/10/2009 1242   LABSPEC 1.018 05/10/2009 1242   PHURINE 5.0 05/10/2009 1242   GLUCOSEU NEGATIVE 05/10/2009 1242   HGBUR TRACE (A) 05/10/2009 1242   BILIRUBINUR NEGATIVE 05/10/2009 1242   KETONESUR 15 (A) 05/10/2009 1242   PROTEINUR NEGATIVE 05/10/2009 1242   UROBILINOGEN 0.2 05/10/2009 1242   NITRITE NEGATIVE 05/10/2009 1242   LEUKOCYTESUR NEGATIVE 05/10/2009 1242   Sepsis Labs: @LABRCNTIP (procalcitonin:4,lacticidven:4)  ) Recent Results (from the past 240 hour(s))  Surgical pcr screen     Status: None   Collection Time: 10/08/18  2:03 PM  Result Value Ref Range Status   MRSA, PCR NEGATIVE NEGATIVE Final   Staphylococcus aureus NEGATIVE NEGATIVE Final    Comment: (NOTE) The Xpert SA Assay (FDA approved for NASAL specimens in patients 34 years of age and older), is one component of a comprehensive surveillance program. It is not intended to diagnose infection nor to guide or monitor treatment. Performed at [facility], [address] [place], [place] [address]          Radiology Studies: Dg Chest Portable 1 View  Result Date: 10/08/2018 CLINICAL DATA:  Preoperative examination prior to hip surgery. No current chest complaints. History of hypertension, never smoked. EXAM: PORTABLE CHEST 1 VIEW COMPARISON:  Portable chest x-ray of October 07, 2018 FINDINGS: The lungs are adequately inflated and clear. The heart and pulmonary vascularity are normal. The mediastinum is normal in width. The trachea is midline. The bony thorax exhibits no acute abnormality. IMPRESSION: There is no active cardiopulmonary disease. Electronically Signed   By: David  [place] M.D.   On:  10/08/2018 12:03   Dg Hip Port Unilat With Pelvis 1v Right  Result Date: 10/09/2018 CLINICAL DATA:  Postop total right hip replacement. EXAM: DG HIP (WITH OR WITHOUT PELVIS) 1V PORT RIGHT COMPARISON:  Preoperative radiograph yesterday. FINDINGS: Unipolar right hip arthroplasty in expected alignment. No periprosthetic lucency or fracture. Recent postsurgical change includes air in soft tissue edema in the subcutaneous tissues. IMPRESSION: Right hip arthroplasty without immediate postoperative complication. Electronically Signed   By: Narda Rutherford M.D.   On: 10/09/2018 01:51        Scheduled Meds: . acetaminophen  500 mg Oral Q8H  . docusate sodium  100 mg Oral BID  . enoxaparin (LOVENOX) injection  40 mg Subcutaneous Q24H  . ketorolac  7.5 mg Intravenous Q6H  . pantoprazole  40 mg Oral Daily  . polyethylene glycol  17 g Oral Daily   Continuous Infusions: . methocarbamol (ROBAXIN) IV 500 mg (10/08/18 1455)     LOS: 1 day    Time spent:  [name], MD [facility] Page via [email or web address], password [email or web address] After 7PM please contact night-coverage  10/09/2018, 2:35 PM

## 2018-10-10 LAB — CBC
HCT: 36.3 % (ref 36.0–46.0)
Hemoglobin: 11.3 g/dL — ABNORMAL LOW (ref 12.0–15.0)
MCH: 27.4 pg (ref 26.0–34.0)
MCHC: 31.1 g/dL (ref 30.0–36.0)
MCV: 88.1 fL (ref 80.0–100.0)
NRBC: 0 % (ref 0.0–0.2)
PLATELETS: 127 10*3/uL — AB (ref 150–400)
RBC: 4.12 MIL/uL (ref 3.87–5.11)
RDW: 13.6 % (ref 11.5–15.5)
WBC: 9.3 10*3/uL (ref 4.0–10.5)

## 2018-10-10 LAB — BASIC METABOLIC PANEL
ANION GAP: 7 (ref 5–15)
BUN: 11 mg/dL (ref 8–23)
CALCIUM: 8.5 mg/dL — AB (ref 8.9–10.3)
CO2: 29 mmol/L (ref 22–32)
Chloride: 100 mmol/L (ref 98–111)
Creatinine, Ser: 1.09 mg/dL — ABNORMAL HIGH (ref 0.44–1.00)
GFR calc Af Amer: 59 mL/min — ABNORMAL LOW (ref >60)
GFR, EST NON AFRICAN AMERICAN: 51 mL/min — AB (ref >60)
GLUCOSE: 133 mg/dL — AB (ref 70–99)
Potassium: 3.5 mmol/L (ref 3.5–5.1)
Sodium: 136 mmol/L (ref 135–145)

## 2018-10-10 MED ORDER — PHENOL 1.4 % MT LIQD
1.0000 | OROMUCOSAL | Status: DC | PRN
  Administered 2018-10-10: 1 via OROMUCOSAL

## 2018-10-10 MED ORDER — SENNOSIDES-DOCUSATE SODIUM 8.6-50 MG PO TABS
1.0000 | ORAL_TABLET | Freq: Every evening | ORAL | Status: DC | PRN
  Administered 2018-10-10 – 2018-10-13 (×3): 1 via ORAL
  Filled 2018-10-10 (×3): qty 1

## 2018-10-10 NOTE — Plan of Care (Signed)
  Problem: Activity: Goal: Risk for activity intolerance will decrease Outcome: Progressing   Problem: Nutrition: Goal: Adequate nutrition will be maintained Outcome: Progressing   Problem: Pain Managment: Goal: General experience of comfort will improve Outcome: Progressing   Problem: Safety: Goal: Ability to remain free from injury will improve Outcome: Progressing   Problem: Skin Integrity: Goal: Risk for impaired skin integrity will decrease Outcome: Progressing   

## 2018-10-10 NOTE — Progress Notes (Signed)
PROGRESS NOTE    [name]  WGN:[id number] DOB: 1946-03-13 DOA: 10/08/2018 PCP: Gordan Payment., MD  Brief Narrative: [name] is a 72 y.o. female with medical history significant of remote HTN and C diff colitis presenting with a mechanical fall.  She was in the kitchen last night.  She turned and her foot caught and got tangled and it caused her to fall.  She landed directly on her right hip.  She had groin pain extending down to her knee.  Her daughter had to help her up and the patient tried to walk with a walker and she walked out to the car and into the ER   Assessment & Plan:   R hip fracture -Mechanical fall resulting in acute nondisplaced subcapital fracture of the right femoral neck with slight impaction -Orthopedics consulted, s/p Hemiarthroplasty 11/26 -Continue Lovenox for DVT prophylaxis -PT/OT evaluation completed, CIR recommended -Appreciate rehab MD consult, awaiting authorization for rehab  H/o C diff -Treated with PO Vanc x 1 and then repeat course with taper -No symptoms of recurrence at this time  Urinary retention -Perioperative anesthesia and narcotics -Resolved, Foley catheter discontinued yesterday  HTN -Stable, monitor  Medication intolerance -Patient with MANY repeated drug allergies/intolerances   DVT prophylaxis:  lovenox Code Status:  DNR - confirmed with patient Family Communication: None present  Disposition Plan:   CIR pending authorization   Consultants:   Ortho  Procedures: 11/26  Procedure(s):  ARTHROPLASTY UNIPOLAR HIP (HEMIARTHROPLASTY) (Right) WITH DEPUY SUMMIT #3 PRESS FIT STEM, STANDARD NECK, 45 MM HEAD  Antimicrobials:    Subjective: -Feels well with no complaints  Objective: Vitals:   10/09/18 1347 10/09/18 1835 10/09/18 2025 10/10/18 0546  BP: (!) 127/58 (!) 135/58 (!) 136/54 (!) 141/57  Pulse: 86 83 90 99  Resp: 16 16  16   Temp: 98 F (36.7 C) 98.4 F (36.9 C)  99 F (37.2 C)  TempSrc: Oral Oral   Oral  SpO2: 96% 98% 98% 93%  Weight:      Height:        Intake/Output Summary (Last 24 hours) at 10/10/2018 1055 Last data filed at 10/10/2018 0545 Gross per 24 hour  Intake 120 ml  Output 350 ml  Net -230 ml   Filed Weights   10/09/18 0841  Weight: 78 kg    Examination:  Gen: Awake, Alert, Oriented X 3, sitting up in bed, no distress HEENT: PERRLA, Neck supple, no JVD Lungs: Clear bilaterally CVS: RRR,No Gallops,Rubs or new Murmurs Abd: soft, Non tender, non distended, BS present Extremities: No edema, right hip with dressing  skin: no new rashes Psychiatry: Judgement and insight appear normal. Mood & affect appropriate.     Data Reviewed:   CBC: Recent Labs  Lab 10/08/18 1147 10/09/18 0527 10/10/18 0339  WBC 8.0 11.1* 9.3  NEUTROABS 6.0  --   --   HGB 12.8 12.4 11.3*  HCT 41.8 40.0 36.3  MCV 88.2 87.9 88.1  PLT 137* 137* 127*   Basic Metabolic Panel: Recent Labs  Lab 10/08/18 1147 10/09/18 0527 10/10/18 0339  NA 140 136 136  K 3.5 3.7 3.5  CL 106 103 100  CO2 27 24 29   GLUCOSE 97 127* 133*  BUN 9 8 11   CREATININE 0.95 0.94 1.09*  CALCIUM 8.9 8.6* 8.5*   GFR: Estimated Creatinine Clearance: 47.1 mL/min (A) (by C-G formula based on SCr of 1.09 mg/dL (H)). Liver Function Tests: Recent Labs  Lab 10/08/18 1147  AST 16  ALT 13  ALKPHOS 66  BILITOT 0.9  PROT 6.7  ALBUMIN 3.6   No results for input(s): LIPASE, AMYLASE in the last 168 hours. No results for input(s): AMMONIA in the last 168 hours. Coagulation Profile: Recent Labs  Lab 10/08/18 1147  INR 1.15   Cardiac Enzymes: No results for input(s): CKTOTAL, CKMB, CKMBINDEX, TROPONINI in the last 168 hours. BNP (last 3 results) No results for input(s): PROBNP in the last 8760 hours. HbA1C: No results for input(s): HGBA1C in the last 72 hours. CBG: No results for input(s): GLUCAP in the last 168 hours. Lipid Profile: No results for input(s): CHOL, HDL, LDLCALC, TRIG, CHOLHDL,  LDLDIRECT in the last 72 hours. Thyroid Function Tests: No results for input(s): TSH, T4TOTAL, FREET4, T3FREE, THYROIDAB in the last 72 hours. Anemia Panel: No results for input(s): VITAMINB12, FOLATE, FERRITIN, TIBC, IRON, RETICCTPCT in the last 72 hours. Urine analysis:    Component Value Date/Time   COLORURINE YELLOW 05/10/2009 1242   APPEARANCEUR HAZY (A) 05/10/2009 1242   LABSPEC 1.018 05/10/2009 1242   PHURINE 5.0 05/10/2009 1242   GLUCOSEU NEGATIVE 05/10/2009 1242   HGBUR TRACE (A) 05/10/2009 1242   BILIRUBINUR NEGATIVE 05/10/2009 1242   KETONESUR 15 (A) 05/10/2009 1242   PROTEINUR NEGATIVE 05/10/2009 1242   UROBILINOGEN 0.2 05/10/2009 1242   NITRITE NEGATIVE 05/10/2009 1242   LEUKOCYTESUR NEGATIVE 05/10/2009 1242   Sepsis Labs: @LABRCNTIP (procalcitonin:4,lacticidven:4)  ) Recent Results (from the past 240 hour(s))  Surgical pcr screen     Status: None   Collection Time: 10/08/18  2:03 PM  Result Value Ref Range Status   MRSA, PCR NEGATIVE NEGATIVE Final   Staphylococcus aureus NEGATIVE NEGATIVE Final    Comment: (NOTE) The Xpert SA Assay (FDA approved for NASAL specimens in patients 22 years of age and older), is one component of a comprehensive surveillance program. It is not intended to diagnose infection nor to guide or monitor treatment. Performed at [facility], [address], [place], [place] [address]          Radiology Studies: Dg Chest Portable 1 View  Result Date: 10/08/2018 CLINICAL DATA:  Preoperative examination prior to hip surgery. No current chest complaints. History of hypertension, never smoked. EXAM: PORTABLE CHEST 1 VIEW COMPARISON:  Portable chest x-ray of October 07, 2018 FINDINGS: The lungs are adequately inflated and clear. The heart and pulmonary vascularity are normal. The mediastinum is normal in width. The trachea is midline. The bony thorax exhibits no acute abnormality. IMPRESSION: There is no active cardiopulmonary  disease. Electronically Signed   By: David  [place] M.D.   On: 10/08/2018 12:03   Dg Hip Port Unilat With Pelvis 1v Right  Result Date: 10/09/2018 CLINICAL DATA:  Postop total right hip replacement. EXAM: DG HIP (WITH OR WITHOUT PELVIS) 1V PORT RIGHT COMPARISON:  Preoperative radiograph yesterday. FINDINGS: Unipolar right hip arthroplasty in expected alignment. No periprosthetic lucency or fracture. Recent postsurgical change includes air in soft tissue edema in the subcutaneous tissues. IMPRESSION: Right hip arthroplasty without immediate postoperative complication. Electronically Signed   By: Narda Rutherford M.D.   On: 10/09/2018 01:51        Scheduled Meds: . docusate sodium  100 mg Oral BID  . enoxaparin (LOVENOX) injection  40 mg Subcutaneous Q24H  . pantoprazole  40 mg Oral Daily  . polyethylene glycol  17 g Oral Daily   Continuous Infusions: . methocarbamol (ROBAXIN) IV 500 mg (10/08/18 1455)     LOS: 2 days  Time spent: 25min    Zannie CovePreetha Krystn Dermody, MD [facility] Page via [email or web address], password [email or web address] After 7PM please contact night-coverage  10/10/2018, 10:55 AM

## 2018-10-11 LAB — CBC
HEMATOCRIT: 32 % — AB (ref 36.0–46.0)
HEMOGLOBIN: 10.1 g/dL — AB (ref 12.0–15.0)
MCH: 28.1 pg (ref 26.0–34.0)
MCHC: 31.6 g/dL (ref 30.0–36.0)
MCV: 89.1 fL (ref 80.0–100.0)
NRBC: 0 % (ref 0.0–0.2)
Platelets: 121 10*3/uL — ABNORMAL LOW (ref 150–400)
RBC: 3.59 MIL/uL — AB (ref 3.87–5.11)
RDW: 13.7 % (ref 11.5–15.5)
WBC: 7.3 10*3/uL (ref 4.0–10.5)

## 2018-10-11 LAB — URINE CULTURE: Culture: NO GROWTH

## 2018-10-11 MED ORDER — FLEET ENEMA 7-19 GM/118ML RE ENEM
1.0000 | ENEMA | Freq: Once | RECTAL | Status: AC
  Administered 2018-10-11: 1 via RECTAL
  Filled 2018-10-11: qty 1

## 2018-10-11 MED ORDER — CALCIUM CITRATE 950 (200 CA) MG PO TABS
200.0000 mg | ORAL_TABLET | Freq: Two times a day (BID) | ORAL | Status: DC
  Administered 2018-10-11: 200 mg via ORAL
  Filled 2018-10-11 (×8): qty 1

## 2018-10-11 MED ORDER — BOOST / RESOURCE BREEZE PO LIQD CUSTOM
1.0000 | Freq: Three times a day (TID) | ORAL | Status: DC
  Administered 2018-10-11: 1 via ORAL
  Filled 2018-10-11 (×2): qty 1

## 2018-10-11 MED ORDER — VITAMIN C 500 MG PO TABS
500.0000 mg | ORAL_TABLET | Freq: Every day | ORAL | Status: DC
  Administered 2018-10-11 – 2018-10-12 (×2): 500 mg via ORAL
  Filled 2018-10-11 (×3): qty 1

## 2018-10-11 MED ORDER — ADULT MULTIVITAMIN W/MINERALS CH
1.0000 | ORAL_TABLET | Freq: Every day | ORAL | Status: DC
  Filled 2018-10-11 (×3): qty 1

## 2018-10-11 MED ORDER — BISACODYL 10 MG RE SUPP
10.0000 mg | Freq: Once | RECTAL | Status: AC
  Administered 2018-10-11: 10 mg via RECTAL
  Filled 2018-10-11: qty 1

## 2018-10-11 MED ORDER — VITAMIN D 25 MCG (1000 UNIT) PO TABS
2000.0000 [IU] | ORAL_TABLET | Freq: Two times a day (BID) | ORAL | Status: DC
  Filled 2018-10-11 (×3): qty 2

## 2018-10-11 NOTE — Progress Notes (Signed)
PROGRESS NOTE    Alicia Chenn B Cogswell  ZOX:[id number] DOB: 05/06/1946 DOA: 10/08/2018 PCP: Gordan PaymentGrisso, Greg A., MD  Brief Narrative: [name] is a 72 y.o. female with medical history significant of remote HTN and C diff colitis presenting with a mechanical fall.  She was in the kitchen last night.  She turned and her foot caught and got tangled and it caused her to fall.  She landed directly on her right hip.  She had groin pain extending down to her knee.  Her daughter had to help her up and the patient tried to walk with a walker and she walked out to the car and into the ER   Assessment & Plan:   R hip fracture -Mechanical fall resulting in acute nondisplaced subcapital fracture of the right femoral neck with slight impaction -Orthopedics consulted, s/p Hemiarthroplasty 11/26 -Continue Lovenox for DVT prophylaxis -PT/OT evaluation completed, CIR recommended -Appreciate rehab MD consult, hopeful for CIR  H/o C diff -Treated with PO Vanc x 1 and then repeat course with taper -No symptoms of recurrence at this time  Urinary retention -Perioperative anesthesia and narcotics -Resolved, Foley catheter discontinued  HTN -Stable, monitor  Medication intolerance -Patient with MANY repeated drug allergies/intolerances   DVT prophylaxis:  lovenox Code Status:  DNR - confirmed with patient Family Communication: None present  Disposition Plan:   CIR, medically stable for DC   Consultants:   Ortho  Procedures: 11/26  Procedure(s):  ARTHROPLASTY UNIPOLAR HIP (HEMIARTHROPLASTY) (Right) WITH DEPUY SUMMIT #3 PRESS FIT STEM, STANDARD NECK, 45 MM HEAD  Antimicrobials:    Subjective: -no complaints , awaiting CIR  Objective: Vitals:   10/10/18 1401 10/10/18 2131 10/11/18 0542 10/11/18 1348  BP: (!) 127/52 (!) 127/49 140/65 (!) 142/51  Pulse: 91 100 82 79  Resp:  12 16   Temp: 98.4 F (36.9 C) 99.5 F (37.5 C) 98.3 F (36.8 C) 98 F (36.7 C)  TempSrc: Oral Oral Oral Oral    SpO2: 95% 95% 95% 100%  Weight:      Height:        Intake/Output Summary (Last 24 hours) at 10/11/2018 1356 Last data filed at 10/11/2018 1100 Gross per 24 hour  Intake 240 ml  Output 1300 ml  Net -1060 ml   Filed Weights   10/09/18 0841  Weight: 78 kg    Examination:  Gen: Awake, Alert, Oriented X 3, sitting up in bed, no distress HEENT: PERRLA, Neck supple, no JVD Lungs: Clear bilaterally CVS: RRR,No Gallops,Rubs or new Murmurs Abd: soft, Non tender, non distended, BS present Extremities: No edema, right hip with dressing  skin: no new rashes Psychiatry: Judgement and insight appear normal. Mood & affect appropriate.     Data Reviewed:   CBC: Recent Labs  Lab 10/08/18 1147 10/09/18 0527 10/10/18 0339 10/11/18 0211  WBC 8.0 11.1* 9.3 7.3  NEUTROABS 6.0  --   --   --   HGB 12.8 12.4 11.3* 10.1*  HCT 41.8 40.0 36.3 32.0*  MCV 88.2 87.9 88.1 89.1  PLT 137* 137* 127* 121*   Basic Metabolic Panel: Recent Labs  Lab 10/08/18 1147 10/09/18 0527 10/10/18 0339  NA 140 136 136  K 3.5 3.7 3.5  CL 106 103 100  CO2 27 24 29   GLUCOSE 97 127* 133*  BUN 9 8 11   CREATININE 0.95 0.94 1.09*  CALCIUM 8.9 8.6* 8.5*   GFR: Estimated Creatinine Clearance: 47.1 mL/min (A) (by C-G formula based on SCr of 1.09 mg/dL (  H)). Liver Function Tests: Recent Labs  Lab 10/08/18 1147  AST 16  ALT 13  ALKPHOS 66  BILITOT 0.9  PROT 6.7  ALBUMIN 3.6   No results for input(s): LIPASE, AMYLASE in the last 168 hours. No results for input(s): AMMONIA in the last 168 hours. Coagulation Profile: Recent Labs  Lab 10/08/18 1147  INR 1.15   Cardiac Enzymes: No results for input(s): CKTOTAL, CKMB, CKMBINDEX, TROPONINI in the last 168 hours. BNP (last 3 results) No results for input(s): PROBNP in the last 8760 hours. HbA1C: No results for input(s): HGBA1C in the last 72 hours. CBG: No results for input(s): GLUCAP in the last 168 hours. Lipid Profile: No results for  input(s): CHOL, HDL, LDLCALC, TRIG, CHOLHDL, LDLDIRECT in the last 72 hours. Thyroid Function Tests: No results for input(s): TSH, T4TOTAL, FREET4, T3FREE, THYROIDAB in the last 72 hours. Anemia Panel: No results for input(s): VITAMINB12, FOLATE, FERRITIN, TIBC, IRON, RETICCTPCT in the last 72 hours. Urine analysis:    Component Value Date/Time   COLORURINE YELLOW 05/10/2009 1242   APPEARANCEUR HAZY (A) 05/10/2009 1242   LABSPEC 1.018 05/10/2009 1242   PHURINE 5.0 05/10/2009 1242   GLUCOSEU NEGATIVE 05/10/2009 1242   HGBUR TRACE (A) 05/10/2009 1242   BILIRUBINUR NEGATIVE 05/10/2009 1242   KETONESUR 15 (A) 05/10/2009 1242   PROTEINUR NEGATIVE 05/10/2009 1242   UROBILINOGEN 0.2 05/10/2009 1242   NITRITE NEGATIVE 05/10/2009 1242   LEUKOCYTESUR NEGATIVE 05/10/2009 1242   Sepsis Labs: @LABRCNTIP (procalcitonin:4,lacticidven:4)  ) Recent Results (from the past 240 hour(s))  Surgical pcr screen     Status: None   Collection Time: 10/08/18  2:03 PM  Result Value Ref Range Status   MRSA, PCR NEGATIVE NEGATIVE Final   Staphylococcus aureus NEGATIVE NEGATIVE Final    Comment: (NOTE) The Xpert SA Assay (FDA approved for NASAL specimens in patients 60 years of age and older), is one component of a comprehensive surveillance program. It is not intended to diagnose infection nor to guide or monitor treatment. Performed at [facility], [address], [place], [place] [address]   Culture, Urine     Status: None   Collection Time: 10/09/18  7:35 PM  Result Value Ref Range Status   Specimen Description URINE, CLEAN CATCH  Final   Special Requests NONE  Final   Culture   Final    NO GROWTH Performed at [facility], [address], [place], [place] [address]    Report Status 10/11/2018 FINAL  Final         Radiology Studies: No results found.      Scheduled Meds: . bisacodyl  10 mg Rectal Once  . calcium citrate  200 mg of elemental calcium Oral BID  .  cholecalciferol  2,000 Units Oral BID  . enoxaparin (LOVENOX) injection  40 mg Subcutaneous Q24H  . pantoprazole  40 mg Oral Daily  . polyethylene glycol  17 g Oral Daily  . vitamin C  500 mg Oral Daily   Continuous Infusions: . methocarbamol (ROBAXIN) IV 500 mg (10/08/18 1455)     LOS: 3 days    Time spent:    [name], MD [facility] Page via [email or web address], password [email or web address] After 7PM please contact night-coverage  10/11/2018, 1:56 PM

## 2018-10-11 NOTE — Care Management Important Message (Signed)
Important Message  Patient Details  Name: [name] MRN: [id number] Date of Birth: 06/27/1946   Medicare Important Message Given:  Yes. CMA tablet is currently not working. CMA informed patient of Medicare rights and answered questions the patient had. CMA left IM with patient.     Masashi Snowdon 10/11/2018, 11:55 AM

## 2018-10-11 NOTE — Progress Notes (Signed)
Orthopedic Trauma Service Progress Note  Patient ID: [name] MRN: [id number] DOB/AGE: 72/07/1946 72 y.o.  Subjective:  Doing great this am  Little bit of a rough day yesterday   Up in chair   Hopeful for CIR   Voiding well    Review of Systems  Constitutional: Negative for chills and fever.  Respiratory: Negative for shortness of breath and wheezing.   Cardiovascular: Negative for chest pain and palpitations.  Gastrointestinal: Negative for nausea and vomiting.    Objective:   VITALS:   Vitals:   10/10/18 0546 10/10/18 1401 10/10/18 2131 10/11/18 0542  BP: (!) 141/57 (!) 127/52 (!) 127/49 140/65  Pulse: 99 91 100 82  Resp: 16  12 16   Temp: 99 F (37.2 C) 98.4 F (36.9 C) 99.5 F (37.5 C) 98.3 F (36.8 C)  TempSrc: Oral Oral Oral Oral  SpO2: 93% 95% 95% 95%  Weight:      Height:        Estimated body mass index is 29.51 kg/m as calculated from the following:   Height as of this encounter: 5' 4.02" (1.626 m).   Weight as of this encounter: 78 kg.   Intake/Output      11/28 0701 - 11/29 0700 11/29 0701 - 11/30 0700   P.O.  240   Total Intake(mL/kg)  240 (3.1)   Urine (mL/kg/hr) 1500 (0.8)    Total Output 1500    Net -1500 +240          LABS  Results for orders placed or performed during the hospital encounter of 10/08/18 (from the past 24 hour(s))  CBC     Status: Abnormal   Collection Time: 10/11/18  2:11 AM  Result Value Ref Range   WBC 7.3 4.0 - 10.5 K/uL   RBC 3.59 (L) 3.87 - 5.11 MIL/uL   Hemoglobin 10.1 (L) 12.0 - 15.0 g/dL   HCT 13.232.0 (L) 44.036.0 - 10.246.0 %   MCV 89.1 80.0 - 100.0 fL   MCH 28.1 26.0 - 34.0 pg   MCHC 31.6 30.0 - 36.0 g/dL   RDW 72.513.7 36.611.5 - 44.015.5 %   Platelets 121 (L) 150 - 400 K/uL   nRBC 0.0 0.0 - 0.2 %     PHYSICAL EXAM:   Gen: NAD, pleasant, sitting up in chair, very appreciative  Lungs: breathing unlabored Cardiac: regular  Ext:   Right  Lower Extremity    Dressing c/d/i   Ext warm    Minimal swelling    No DCT    Compartments are soft   Motor and sensory functions intact   + DP pulse   Assessment/Plan: 3 Days Post-Op   Principal Problem:   Hip fracture requiring operative repair, right, closed, initial encounter [facility](HCC) Active Problems:   Essential hypertension   History of Clostridium difficile infection   Medication intolerance   Generalized osteoarthritis   Steroid-induced hyperglycemia   Postoperative pain   Leukocytosis   Anti-infectives (From admission, onward)   Start     Dose/Rate Route Frequency Ordered Stop   10/09/18 0600  vancomycin (VANCOCIN) IVPB 1000 mg/200 mL premix     1,000 mg 200 mL/hr over 60 Minutes Intravenous Every 12 hours 10/08/18 2228 10/09/18 0621   10/08/18 1130  vancomycin (VANCOCIN) IVPB 1000 mg/200 mL premix  1,000 mg 200 mL/hr over 60 Minutes Intravenous To Surgery 10/08/18 1113 10/08/18 2036    .  POD/HD#: 81  72 y/o female s/p fall with R femoral neck fracture   - fall  -R femoral neck fracture s/p R hip hemiarthroplasty   WBAT   Posterior hip precautions  Dressing changes as needed starting now   Ok to clean wound with soap and water only   Can leave dressing off once wound is dry      PT/OT  Ice PRN   TED hose   - Pain management:  Continue current regimen   - ABL anemia/Hemodynamics  Stable   - Medical issues   Per medical service   - DVT/PE prophylaxis:  Lovenox x 30 days post op   - ID:   Periop abx completed   - Metabolic Bone Disease:  + vitamin d deficiency   Replace vitamin d and calcium    Vitamin c   outpt dexa   - Activity:  OOB with assist  WBAT R leg  Posterior hip precautions   - FEN/GI prophylaxis/Foley/Lines:  Reg diet  protonix   - Impediments to fracture healing:  Osteoporosis   - Dispo:  Continue with therapy   CIR?  Ortho issues stable     Mearl Latin, PA-C [phone] (C) 10/11/2018, 11:41  AM  [facility] [address] [place] [address] [phone] Collier Bullock (F)

## 2018-10-11 NOTE — Progress Notes (Signed)
Inpatient Rehabilitation-Admissions Coordinator   [facility] discuss possible [facility]. [facility] as she was not wanting to discuss rehab today. AC left booklets and brochures for pt information. AC will follow up Monday if pt still in house.   Please call if questions.   Nanine MeansKelly Loryn Haacke, OTR/L  Rehab Admissions Coordinator  [phone] 10/11/2018 4:04 PM'

## 2018-10-11 NOTE — Progress Notes (Signed)
Physical Therapy Treatment Patient Details Name: [name] MRN: [id number] DOB: 01/12/1946 Today's Date: 10/11/2018    History of Present Illness Pt is a 72 y/o female admitted following fall. Found to have R hip fracture, and is s/p R hip hemiarthroplasty. PMH includes HTN and cervical fusion.     PT Comments    Patient seen for LE strengthening/ROM exercises. Pt given HEP handout and reviewed. Pt requires assistance for hip flexion and abduction exercises. Continue to progress as tolerated.    Follow Up Recommendations  CIR;Supervision for mobility/OOB     Equipment Recommendations  Rolling walker with 5" wheels;3in1 (PT)    Recommendations for Other Services Rehab consult;OT consult     Precautions / Restrictions Precautions Precautions: Posterior Hip Precaution Booklet Issued: Yes (comment) Precaution Comments: precautions reviewed with pt Restrictions Weight Bearing Restrictions: Yes RLE Weight Bearing: Weight bearing as tolerated RLE Partial Weight Bearing Percentage or Pounds: 50    Mobility  Bed Mobility Overal bed mobility: Needs Assistance Bed Mobility: Supine to Sit     Supine to sit: Min assist     General bed mobility comments: assist to bring R LE to EOB; use of rail  Transfers Overall transfer level: Needs assistance Equipment used: Rolling walker (2 wheeled) Transfers: Sit to/from [employer] CorporationStand;Stand Pivot Transfers Sit to Stand: Min guard Stand pivot transfers: Min guard       General transfer comment: min guard for safety; cues for safe hand placement  Ambulation/Gait        Stairs             Wheelchair Mobility    Modified Rankin (Stroke Patients Only)       Balance Overall balance assessment: Needs assistance Sitting-balance support: No upper extremity supported;Feet supported Sitting balance-Leahy Scale: Fair     Standing balance support: Bilateral upper extremity supported;During functional activity Standing  balance-Leahy Scale: Poor Standing balance comment: Reliant on BUE support                             Cognition Arousal/Alertness: Awake/alert Behavior During Therapy: WFL for tasks assessed/performed Overall Cognitive Status: Within Functional Limits for tasks assessed                                        Exercises Total Joint Exercises Ankle Circles/Pumps: AROM;Both;20 reps Quad Sets: AROM;Both Gluteal Sets: AROM;Both Short Arc Quad: AROM;Right Heel Slides: AAROM;Right Hip ABduction/ADduction: AAROM;Right    General Comments        Pertinent Vitals/Pain Pain Assessment: Faces Faces Pain Scale: Hurts little more Pain Location: R hip  Pain Descriptors / Indicators: Operative site guarding;Sore Pain Intervention(s): Limited activity within patient's tolerance;Monitored during session;Premedicated before session;Repositioned    Home Living                      Prior Function            PT Goals (current goals can now be found in the care plan section) Acute Rehab PT Goals Patient Stated Goal: to get stronger before going home  Progress towards PT goals: Progressing toward goals    Frequency    Min 5X/week      PT Plan Current plan remains appropriate    Co-evaluation              AM-PAC PT "6  Clicks" Mobility   Outcome Measure  Help needed turning from your back to your side while in a flat bed without using bedrails?: A Lot Help needed moving from lying on your back to sitting on the side of a flat bed without using bedrails?: A Lot Help needed moving to and from a bed to a chair (including a wheelchair)?: A Lot Help needed standing up from a chair using your arms (e.g., wheelchair or bedside chair)?: A Little Help needed to walk in hospital room?: A Lot Help needed climbing 3-5 steps with a railing? : A Lot 6 Click Score: 13    End of Session Equipment Utilized During Treatment: Gait belt Activity  Tolerance: Patient tolerated treatment well Patient left: with call bell/phone within reach;Other (comment)(pt sitting on BSC--NT notified) Nurse Communication: Mobility status PT Visit Diagnosis: Unsteadiness on feet (R26.81);Muscle weakness (generalized) (M62.81);Pain Pain - Right/Left: Right Pain - part of body: Hip     Time: 1510-1533 PT Time Calculation (min) (ACUTE ONLY): 23 min  Charges:   $Therapeutic Exercise: 8-22 mins $Therapeutic Activity: 8-22 mins                     Erline Levine, PTA Acute Rehabilitation Services Pager: [phone] Office: [phone]     [name] 10/11/2018, 5:16 PM

## 2018-10-11 NOTE — Progress Notes (Signed)
Nutrition Follow-up  DOCUMENTATION CODES:   Not applicable  INTERVENTION:   -Boost Breeze po TID, each supplement provides 250 kcal and 9 grams of protein -MVI with minerals daily  NUTRITION DIAGNOSIS:   Increased nutrient needs related to post-op healing as evidenced by estimated needs.  Ongoing  GOAL:   Patient will meet greater than or equal to 90% of their needs  Progressing  MONITOR:   PO intake, Supplement acceptance, Labs, Weight trends, Skin, I & O's  REASON FOR ASSESSMENT:   Consult Assessment of nutrition requirement/status  ASSESSMENT:   [name] is a 72 y.o. female with medical history significant of remote HTN and C diff colitis presenting with a mechanical fall.  She was in the kitchen last night.  She turned and her foot caught and got tangled and it caused her to fall.  She landed directly on her right hip.  She had groin pain extending down to her knee.  Her daughter had to help her up and the patient tried to walk with a walker and she walked out to the car and into the ER.  11/26- Procedure(s): ARTHROPLASTY UNIPOLAR HIP (HEMIARTHROPLASTY) (Right) WITH DEPUY SUMMIT #3 PRESS FIT STEM, STANDARD NECK, 45 MM HEAD  RD re-consulted for assessment. Spoke with pt at bedside, who is pleased with her progress today. She reports her appetite continues to be poor, which she attributes to pain. Observed lunch tray- pt consumed 100% of mashed potatoes, a few bites of fish, and 100% of juice and jello. Meal completion variable; noted 25-75%.  Pt reports good appetite PTA; she consumes 3 meals per day in addition to snacks. Pt shares that she recently changed her diet to a low fiber diet due to a recent bout with c-diff. There are many foods that she avoids and nutritional services staff have been helping her with meal selections.   Pt reports a 10 pound weight loss over the past month, however, this is not consistent for time frame. Noted wt of 165# on 09/04/18 per  [facility].   Pt interested in consuming supplements due to poor appetite, however, refusing Ensure due to avoiding milk products. Provided pt with Boost Breeze supplements. Discussed importance of good meal and supplement intake to promote healing.   Labs reviewed.   NUTRITION - FOCUSED PHYSICAL EXAM:    Most Recent Value  Orbital Region  Mild depletion  Upper Arm Region  No depletion  Thoracic and Lumbar Region  No depletion  Buccal Region  No depletion  Temple Region  No depletion  Clavicle Bone Region  No depletion  Clavicle and Acromion Bone Region  No depletion  Scapular Bone Region  No depletion  Dorsal Hand  No depletion  Patellar Region  No depletion  Anterior Thigh Region  No depletion  Posterior Calf Region  No depletion  Edema (RD Assessment)  Mild  Hair  Reviewed  Eyes  Reviewed  Mouth  Reviewed  Skin  Reviewed  Nails  Reviewed       Diet Order:   Diet Order            Diet regular Room service appropriate? Yes; Fluid consistency: Thin  Diet effective now              EDUCATION NEEDS:   No education needs have been identified at this time  Skin:  Skin Assessment: Skin Integrity Issues: Skin Integrity Issues:: Incisions Incisions: closed rt hip  Last BM:  10/07/18  Height:  Ht Readings from Last 1 Encounters:  10/09/18 5' 4.02" (1.626 m)    Weight:   Wt Readings from Last 1 Encounters:  10/09/18 78 kg    Ideal Body Weight:  54.5 kg  BMI:  Body mass index is 29.51 kg/m.  Estimated Nutritional Needs:   Kcal:  1700-1900  Protein:  90-105 grams  Fluid:  1.7-1.9 L    Kirubel Aja A. Mayford Knife, RD, LDN, CDE Pager: [phone] After hours Pager: [phone]

## 2018-10-11 NOTE — Progress Notes (Signed)
Physical Therapy Treatment Patient Details Name: [name] MRN: [id number] DOB: 06/12/1946 Today's Date: 10/11/2018    History of Present Illness Pt is a 72 y/o female admitted following fall. Found to have R hip fracture, and is s/p R hip hemiarthroplasty. PMH includes HTN and cervical fusion.     PT Comments    Patient seen for mobility progression. Pt is making progress toward PT goals and tolerated session well. Pt requires min/mod A for bed mobility/functional transfers and min A +2 for gait progression. Pt is anxious with mobility. Continue to recommend CIR for further skilled PT services to maximize independence and safety with mobility.     Follow Up Recommendations  CIR;Supervision for mobility/OOB     Equipment Recommendations  Rolling walker with 5" wheels;3in1 (PT)    Recommendations for Other Services Rehab consult;OT consult     Precautions / Restrictions Precautions Precautions: Posterior Hip Precaution Booklet Issued: No Precaution Comments: Verbally reviewed posterior hip precautions.  Restrictions Weight Bearing Restrictions: Yes RLE Weight Bearing: Weight bearing as tolerated RLE Partial Weight Bearing Percentage or Pounds: 50    Mobility  Bed Mobility Overal bed mobility: Needs Assistance Bed Mobility: Supine to Sit     Supine to sit: Mod assist     General bed mobility comments: assist to bring R LE to EOB; cues for sequencing to maintain hip precautions; use of rail   Transfers Overall transfer level: Needs assistance Equipment used: Rolling walker (2 wheeled) Transfers: Sit to/from Stand Sit to Stand: From elevated surface;Min assist         General transfer comment: assist to power up into standing; cues for safe hand placement   Ambulation/Gait Ambulation/Gait assistance: Min assist;+2 safety/equipment(chair follow) Gait Distance (Feet): 60 Feet Assistive device: Rolling walker (2 wheeled) Gait Pattern/deviations: Step-to  pattern;Decreased stance time - right;Decreased step length - left;Decreased step length - right;Decreased dorsiflexion - right;Decreased weight shift to right Gait velocity: decreased   General Gait Details: cues for posture, sequencing, and maintaining all precautions   Stairs             Wheelchair Mobility    Modified Rankin (Stroke Patients Only)       Balance Overall balance assessment: Needs assistance Sitting-balance support: No upper extremity supported;Feet supported Sitting balance-Leahy Scale: Fair     Standing balance support: Bilateral upper extremity supported;During functional activity Standing balance-Leahy Scale: Poor Standing balance comment: Reliant on BUE support                             Cognition Arousal/Alertness: Awake/alert Behavior During Therapy: WFL for tasks assessed/performed Overall Cognitive Status: Within Functional Limits for tasks assessed                                        Exercises Total Joint Exercises Ankle Circles/Pumps: 20 reps Quad Sets: AROM;Both Gluteal Sets: AROM;Both    General Comments        Pertinent Vitals/Pain Pain Assessment: Faces Faces Pain Scale: Hurts little more Pain Location: R hip  Pain Descriptors / Indicators: Aching;Operative site guarding Pain Intervention(s): Limited activity within patient's tolerance;Monitored during session;Premedicated before session;Repositioned    Home Living                      Prior Function  PT Goals (current goals can now be found in the care plan section) Acute Rehab PT Goals Patient Stated Goal: to get stronger before going home  Progress towards PT goals: Progressing toward goals    Frequency    Min 5X/week      PT Plan Current plan remains appropriate    Co-evaluation              AM-PAC PT "6 Clicks" Mobility   Outcome Measure  Help needed turning from your back to your side while  in a flat bed without using bedrails?: A Lot Help needed moving from lying on your back to sitting on the side of a flat bed without using bedrails?: A Lot Help needed moving to and from a bed to a chair (including a wheelchair)?: A Lot Help needed standing up from a chair using your arms (e.g., wheelchair or bedside chair)?: A Little Help needed to walk in hospital room?: A Lot Help needed climbing 3-5 steps with a railing? : A Lot 6 Click Score: 13    End of Session Equipment Utilized During Treatment: Gait belt Activity Tolerance: Patient tolerated treatment well Patient left: in chair;with call bell/phone within reach Nurse Communication: Mobility status PT Visit Diagnosis: Unsteadiness on feet (R26.81);Muscle weakness (generalized) (M62.81);Pain Pain - Right/Left: Right Pain - part of body: Hip     Time: 1610-9604 PT Time Calculation (min) (ACUTE ONLY): 22 min  Charges:  $Gait Training: 8-22 mins                     Erline Levine, PTA Acute Rehabilitation Services Pager: [phone] Office: [phone]     [name] 10/11/2018, 1:49 PM

## 2018-10-11 NOTE — Evaluation (Signed)
Occupational Therapy Evaluation Patient Details Name: [name] MRN: [id number] DOB: 1946-10-11 Today's Date: 10/11/2018    History of Present Illness Pt is a 72 y/o female admitted following fall. Found to have R hip fracture, and is s/p R hip hemiarthroplasty. PMH includes HTN and cervical fusion.    Clinical Impression   PTA Pt independent in ADL and mobility. Pt able to recall 1/3 precautions, provided with posterior hip handout and reviewed functional applications for ADL with emphasis on LB dressing/bathing. Educated and demonstrated AE kit. Pt will require CIR level therapy at dc to maximize safety and independence in ADL and functional transfers and to reinforce posterior precautions.     Follow Up Recommendations  CIR    Equipment Recommendations  Other (comment)(defer to the next venue)    Recommendations for Other Services       Precautions / Restrictions Precautions Precautions: Posterior Hip Precaution Booklet Issued: Yes (comment) Precaution Comments: precautions reviewed with pt Restrictions Weight Bearing Restrictions: Yes RLE Weight Bearing: Partial weight bearing RLE Partial Weight Bearing Percentage or Pounds: 50      Mobility Bed Mobility Overal bed mobility: Needs Assistance Bed Mobility: Supine to Sit     Supine to sit: Min assist     General bed mobility comments: NT this session  Transfers Overall transfer level: Needs assistance Equipment used: Rolling walker (2 wheeled) Transfers: Sit to/from [facility] Sit to Stand: Min guard Stand pivot transfers: Min guard       General transfer comment: NT this session    Balance                                           ADL either performed or assessed with clinical judgement   ADL Overall ADL's : Needs assistance/impaired Eating/Feeding: Independent   Grooming: Set up;Sitting   Upper Body Bathing: Set up;Sitting   Lower Body Bathing: Maximal  assistance;Sitting/lateral leans Lower Body Bathing Details (indicate cue type and reason): initiated education for AE (long handle sponge) Upper Body Dressing : Minimal assistance;Sitting   Lower Body Dressing: Maximal assistance;Sit to/from stand Lower Body Dressing Details (indicate cue type and reason): educated Pt in reacher/grabber, long handle shoe horn, sock donner Toilet Transfer: Min guard;Stand-pivot   Toileting- Clothing Manipulation and Hygiene: Maximal assistance Toileting - Clothing Manipulation Details (indicate cue type and reason): educated in use of toilet aide - peri care is of high importance to her Tub/ Banker: Min guard;Minimal assistance;Stand-pivot;3 in English as a second language teacher   Functional mobility during ADLs: Min guard;Minimal assistance;Rolling walker General ADL Comments: educated Pt on AE for LB ADL with application of posterior precautions     Vision         Perception     Praxis      Pertinent Vitals/Pain Pain Assessment: 0-10 Pain Score: 7  Faces Pain Scale: Hurts little more Pain Location: R hip  Pain Descriptors / Indicators: Operative site guarding;Sore Pain Intervention(s): Limited activity within patient's tolerance;Monitored during session     Hand Dominance Right   Extremity/Trunk Assessment Upper Extremity Assessment Upper Extremity Assessment: Overall WFL for tasks assessed   Lower Extremity Assessment Lower Extremity Assessment: RLE deficits/detail RLE Deficits / Details: Deficits consistent with post op pain and weakness.    Cervical / Trunk Assessment Cervical / Trunk Assessment: Kyphotic   Communication Communication Communication: No difficulties   Cognition Arousal/Alertness: Awake/alert Behavior  During Therapy: WFL for tasks assessed/performed Overall Cognitive Status: Within Functional Limits for tasks assessed                                     General Comments       Exercises Exercises:  Total Joint Total Joint Exercises Ankle Circles/Pumps: AROM;Both;20 reps Quad Sets: AROM;Both Short Arc Quad: AROM;Right Heel Slides: AAROM;Right Hip ABduction/ADduction: AAROM;Right   Shoulder Instructions      Home Living Family/patient expects to be discharged to:: Inpatient rehab Living Arrangements: Spouse/significant other   Type of Home: House Home Access: Stairs to enter Entrance Stairs-Number of Steps: 2 Entrance Stairs-Rails: None Home Layout: One level                   Additional Comments: Pt's husband with dementia       Prior Functioning/Environment Level of Independence: Independent                 OT Problem List: Decreased strength;Decreased range of motion;Decreased activity tolerance;Impaired balance (sitting and/or standing);Decreased knowledge of use of DME or AE;Decreased knowledge of precautions;Pain      OT Treatment/Interventions: Self-care/ADL training;DME and/or AE instruction;Therapeutic activities;Patient/family education;Balance training    OT Goals(Current goals can be found in the care plan section) Acute Rehab OT Goals Patient Stated Goal: to get stronger before going home  OT Goal Formulation: With patient Time For Goal Achievement: 10/25/18 Potential to Achieve Goals: Good ADL Goals Pt Will Perform Grooming: with modified independence;standing Pt Will Perform Lower Body Bathing: with modified independence;with adaptive equipment;sitting/lateral leans Pt Will Perform Lower Body Dressing: with supervision;with adaptive equipment;sit to/from stand Pt Will Transfer to Toilet: ambulating;with supervision Pt Will Perform Toileting - Clothing Manipulation and hygiene: with supervision;sit to/from stand Additional ADL Goal #1: Pt will perform bed mobility at mod I level prior to engaging in ADL activity  OT Frequency: Min 3X/week   Barriers to D/C:            Co-evaluation              AM-PAC OT "6 Clicks" Daily  Activity     Outcome Measure Help from another person eating meals?: None Help from another person taking care of personal grooming?: A Little Help from another person toileting, which includes using toliet, bedpan, or urinal?: A Lot Help from another person bathing (including washing, rinsing, drying)?: A Lot Help from another person to put on and taking off regular upper body clothing?: A Little Help from another person to put on and taking off regular lower body clothing?: A Lot 6 Click Score: 16   End of Session Nurse Communication: Mobility status;Precautions  Activity Tolerance: Patient tolerated treatment well Patient left: in bed;with call bell/phone within reach;with bed alarm set;Other (comment)(PT in to educate/perform exercises)  OT Visit Diagnosis: Unsteadiness on feet (R26.81);Other abnormalities of gait and mobility (R26.89);Pain Pain - Right/Left: Right Pain - part of body: Hip                Time: 1451-1510 OT Time Calculation (min): 19 min Charges:  OT General Charges $OT Visit: 1 Visit OT Evaluation $OT Eval Moderate Complexity: [place] OTR/L Acute Rehabilitation Services Pager: [phone] Office: [place] 10/11/2018, 5:59 PM

## 2018-10-12 LAB — CBC
HEMATOCRIT: 34.1 % — AB (ref 36.0–46.0)
Hemoglobin: 10.6 g/dL — ABNORMAL LOW (ref 12.0–15.0)
MCH: 27.7 pg (ref 26.0–34.0)
MCHC: 31.1 g/dL (ref 30.0–36.0)
MCV: 89 fL (ref 80.0–100.0)
Platelets: UNDETERMINED 10*3/uL (ref 150–400)
RBC: 3.83 MIL/uL — ABNORMAL LOW (ref 3.87–5.11)
RDW: 13.6 % (ref 11.5–15.5)
WBC: 6.8 10*3/uL (ref 4.0–10.5)
nRBC: 0 % (ref 0.0–0.2)

## 2018-10-12 LAB — TSH: TSH: 2.802 u[IU]/mL (ref 0.350–4.500)

## 2018-10-12 LAB — PREALBUMIN: Prealbumin: 11.2 mg/dL — ABNORMAL LOW (ref 18–38)

## 2018-10-12 NOTE — Plan of Care (Signed)
  Problem: Education: Goal: Knowledge of General Education information will improve Description: Including pain rating scale, medication(s)/side effects and non-pharmacologic comfort measures Outcome: Progressing   Problem: Health Behavior/Discharge Planning: Goal: Ability to manage health-related needs will improve Outcome: Progressing   Problem: Activity: Goal: Risk for activity intolerance will decrease Outcome: Progressing   

## 2018-10-12 NOTE — Progress Notes (Signed)
Patient seen and examined -Finally had a BM after enema yesterday -Remains stable, awaiting CIR  Alicia CovePreetha Almond Fitzgibbon, MD

## 2018-10-13 MED ORDER — WHITE PETROLATUM EX OINT
TOPICAL_OINTMENT | CUTANEOUS | Status: AC
  Administered 2018-10-14: 07:00:00
  Filled 2018-10-13: qty 28.35

## 2018-10-13 NOTE — Plan of Care (Signed)
  Problem: Education: Goal: Knowledge of General Education information will improve Description: Including pain rating scale, medication(s)/side effects and non-pharmacologic comfort measures Outcome: Progressing   Problem: Health Behavior/Discharge Planning: Goal: Ability to manage health-related needs will improve Outcome: Progressing   Problem: Clinical Measurements: Goal: Diagnostic test results will improve Outcome: Progressing   

## 2018-10-13 NOTE — Progress Notes (Signed)
Patient seen and examined, remains medically stable awaiting CIR -Hopefully DC to CIR Monday  Zannie CovePreetha Corean Yoshimura, MD

## 2018-10-13 NOTE — Progress Notes (Signed)
Physical Therapy Treatment Patient Details Name: [name] MRN: [id number] DOB: 04-Oct-1946 Today's Date: 10/13/2018    History of Present Illness Pt is a 72 y/o female admitted following fall. Found to have R hip fracture, and is s/p R hip hemiarthroplasty. PMH includes HTN and cervical fusion.     PT Comments    Patient seen for mobility progression. Pt requires min guard for sit to stand transfers and min guard/ min A for gait training. Pt tolerated increased gait distance and able to maintain precautions throughout. Pt continues to be very guarded and with slow cadence. HEP reviewed. Pt reports 4/10 pain with mobility. Continue to progress as tolerated.   Follow Up Recommendations  CIR;Supervision for mobility/OOB     Equipment Recommendations  Rolling walker with 5" wheels;3in1 (PT)    Recommendations for Other Services Rehab consult;OT consult     Precautions / Restrictions Precautions Precautions: Posterior Hip Precaution Booklet Issued: Yes (comment) Precaution Comments: pt recalled 2/3 precautions; 3/3 precautions reviewed with pt Restrictions Weight Bearing Restrictions: Yes RLE Weight Bearing: Weight bearing as tolerated RLE Partial Weight Bearing Percentage or Pounds: 50    Mobility  Bed Mobility               General bed mobility comments: pt OOB in chair upon arrival  Transfers Overall transfer level: Needs assistance Equipment used: Rolling walker (2 wheeled) Transfers: Sit to/from Stand Sit to Stand: Min guard         General transfer comment: safe hand placement demonstrated; min guard for safety  Ambulation/Gait Ambulation/Gait assistance: Min assist;Min guard Gait Distance (Feet): 120 Feet Assistive device: Rolling walker (2 wheeled) Gait Pattern/deviations: Step-to pattern;Decreased stance time - right;Decreased step length - left;Decreased weight shift to right Gait velocity: decreased   General Gait Details: cues for upright  posture/forward gaze, sequencing, maintaining weight bearing status; pt with improved R dorsidflexion and hip flexion during swing phase this session   Stairs             Wheelchair Mobility    Modified Rankin (Stroke Patients Only)       Balance Overall balance assessment: Needs assistance Sitting-balance support: No upper extremity supported;Feet supported Sitting balance-Leahy Scale: Fair     Standing balance support: Bilateral upper extremity supported;During functional activity Standing balance-Leahy Scale: Poor                              Cognition Arousal/Alertness: Awake/alert Behavior During Therapy: WFL for tasks assessed/performed Overall Cognitive Status: Within Functional Limits for tasks assessed                                        Exercises Total Joint Exercises Short Arc Quad: AROM;Right;10 reps Hip ABduction/ADduction: AAROM;Right;10 reps    General Comments        Pertinent Vitals/Pain Pain Assessment: 0-10 Pain Score: 4  Pain Location: R hip/groin Pain Descriptors / Indicators: Sore;Guarding Pain Intervention(s): Limited activity within patient's tolerance;Monitored during session;Repositioned;Patient requesting pain meds-RN notified    Home Living                      Prior Function            PT Goals (current goals can now be found in the care plan section) Acute Rehab PT Goals Patient Stated Goal: to  get stronger before going home  Progress towards PT goals: Progressing toward goals    Frequency    Min 5X/week      PT Plan Current plan remains appropriate    Co-evaluation              AM-PAC PT "6 Clicks" Mobility   Outcome Measure  Help needed turning from your back to your side while in a flat bed without using bedrails?: A Lot Help needed moving from lying on your back to sitting on the side of a flat bed without using bedrails?: A Lot Help needed moving to and from  a bed to a chair (including a wheelchair)?: A Little Help needed standing up from a chair using your arms (e.g., wheelchair or bedside chair)?: A Little Help needed to walk in hospital room?: A Little Help needed climbing 3-5 steps with a railing? : A Lot 6 Click Score: 15    End of Session Equipment Utilized During Treatment: Gait belt Activity Tolerance: Patient tolerated treatment well Patient left: with call bell/phone within reach;in chair Nurse Communication: Mobility status PT Visit Diagnosis: Unsteadiness on feet (R26.81);Muscle weakness (generalized) (M62.81);Pain Pain - Right/Left: Right Pain - part of body: Hip     Time: 0930-1007 PT Time Calculation (min) (ACUTE ONLY): 37 min  Charges:  $Gait Training: 23-37 mins                     Erline LevineKellyn Tyshaun Vinzant, PTA Acute Rehabilitation Services Pager: [phone] Office: [phone]     Carolynne EdouardKellyn R Alisson Rozell 10/13/2018, 10:26 AM

## 2018-10-14 ENCOUNTER — Other Ambulatory Visit: Payer: Self-pay

## 2018-10-14 ENCOUNTER — Encounter (HOSPITAL_COMMUNITY): Payer: Self-pay

## 2018-10-14 ENCOUNTER — Encounter (HOSPITAL_COMMUNITY): Payer: Self-pay | Admitting: Physical Medicine and Rehabilitation

## 2018-10-14 ENCOUNTER — Inpatient Hospital Stay (HOSPITAL_COMMUNITY)
Admission: RE | Admit: 2018-10-14 | Discharge: 2018-10-22 | DRG: 560 | Disposition: A | Discharge status: Home / Self Care | Payer: Medicare Other | Source: Intra-hospital | Attending: Physical Medicine & Rehabilitation | Admitting: Physical Medicine & Rehabilitation

## 2018-10-14 DIAGNOSIS — R350 Frequency of micturition: Secondary | ICD-10-CM

## 2018-10-14 DIAGNOSIS — I10 Essential (primary) hypertension: Secondary | ICD-10-CM | POA: Diagnosis present

## 2018-10-14 DIAGNOSIS — Z96641 Presence of right artificial hip joint: Secondary | ICD-10-CM | POA: Diagnosis present

## 2018-10-14 DIAGNOSIS — W010XXD Fall on same level from slipping, tripping and stumbling without subsequent striking against object, subsequent encounter: Secondary | ICD-10-CM | POA: Diagnosis present

## 2018-10-14 DIAGNOSIS — K5903 Drug induced constipation: Secondary | ICD-10-CM

## 2018-10-14 DIAGNOSIS — S72009A Fracture of unspecified part of neck of unspecified femur, initial encounter for closed fracture: Secondary | ICD-10-CM

## 2018-10-14 DIAGNOSIS — E889 Metabolic disorder, unspecified: Secondary | ICD-10-CM

## 2018-10-14 DIAGNOSIS — T454X5A Adverse effect of iron and its compounds, initial encounter: Secondary | ICD-10-CM | POA: Diagnosis present

## 2018-10-14 DIAGNOSIS — E559 Vitamin D deficiency, unspecified: Secondary | ICD-10-CM | POA: Diagnosis present

## 2018-10-14 DIAGNOSIS — D62 Acute posthemorrhagic anemia: Secondary | ICD-10-CM

## 2018-10-14 DIAGNOSIS — S72001D Fracture of unspecified part of neck of right femur, subsequent encounter for closed fracture with routine healing: Principal | ICD-10-CM

## 2018-10-14 DIAGNOSIS — Z9119 Patient's noncompliance with other medical treatment and regimen: Secondary | ICD-10-CM

## 2018-10-14 DIAGNOSIS — D696 Thrombocytopenia, unspecified: Secondary | ICD-10-CM | POA: Diagnosis present

## 2018-10-14 DIAGNOSIS — M898X9 Other specified disorders of bone, unspecified site: Secondary | ICD-10-CM

## 2018-10-14 DIAGNOSIS — E8889 Other specified metabolic disorders: Secondary | ICD-10-CM | POA: Diagnosis present

## 2018-10-14 DIAGNOSIS — T148XXA Other injury of unspecified body region, initial encounter: Secondary | ICD-10-CM

## 2018-10-14 DIAGNOSIS — G8918 Other acute postprocedural pain: Secondary | ICD-10-CM

## 2018-10-14 DIAGNOSIS — R030 Elevated blood-pressure reading, without diagnosis of hypertension: Secondary | ICD-10-CM

## 2018-10-14 DIAGNOSIS — M9080 Osteopathy in diseases classified elsewhere, unspecified site: Secondary | ICD-10-CM

## 2018-10-14 DIAGNOSIS — K5900 Constipation, unspecified: Secondary | ICD-10-CM | POA: Diagnosis present

## 2018-10-14 DIAGNOSIS — K219 Gastro-esophageal reflux disease without esophagitis: Secondary | ICD-10-CM | POA: Diagnosis present

## 2018-10-14 DIAGNOSIS — R739 Hyperglycemia, unspecified: Secondary | ICD-10-CM | POA: Diagnosis present

## 2018-10-14 DIAGNOSIS — R5381 Other malaise: Secondary | ICD-10-CM | POA: Diagnosis present

## 2018-10-14 DIAGNOSIS — E46 Unspecified protein-calorie malnutrition: Secondary | ICD-10-CM | POA: Diagnosis present

## 2018-10-14 DIAGNOSIS — S72001S Fracture of unspecified part of neck of right femur, sequela: Secondary | ICD-10-CM | POA: Diagnosis not present

## 2018-10-14 DIAGNOSIS — Z91199 Patient's noncompliance with other medical treatment and regimen due to unspecified reason: Secondary | ICD-10-CM

## 2018-10-14 HISTORY — DX: Fracture of unspecified part of neck of unspecified femur, initial encounter for closed fracture: S72.009A

## 2018-10-14 LAB — URINALYSIS, ROUTINE W REFLEX MICROSCOPIC
BILIRUBIN URINE: NEGATIVE
Glucose, UA: NEGATIVE mg/dL
Hgb urine dipstick: NEGATIVE
Ketones, ur: NEGATIVE mg/dL
Leukocytes, UA: NEGATIVE
Nitrite: NEGATIVE
Protein, ur: NEGATIVE mg/dL
Specific Gravity, Urine: 1.012 (ref 1.005–1.030)
pH: 8 (ref 5.0–8.0)

## 2018-10-14 MED ORDER — GUAIFENESIN-DM 100-10 MG/5ML PO SYRP: 5.0000 mL | ORAL_SOLUTION | Freq: Four times a day (QID) | ORAL | Status: DC | PRN

## 2018-10-14 MED ORDER — DIPHENHYDRAMINE HCL 12.5 MG/5ML PO ELIX: 12.5000 mg | ORAL_SOLUTION | Freq: Four times a day (QID) | ORAL | Status: DC | PRN

## 2018-10-14 MED ORDER — PHENOL 1.4 % MT LIQD: 1.0000 | OROMUCOSAL | Status: DC | PRN

## 2018-10-14 MED ORDER — TRAMADOL HCL 50 MG PO TABS: 50.0000 mg | ORAL_TABLET | Freq: Four times a day (QID) | ORAL | Status: DC | PRN

## 2018-10-14 MED ORDER — PANTOPRAZOLE SODIUM 40 MG PO TBEC: 40.0000 mg | DELAYED_RELEASE_TABLET | Freq: Every day | ORAL | Status: DC

## 2018-10-14 MED ORDER — TRAZODONE HCL 50 MG PO TABS
25.0000 mg | ORAL_TABLET | Freq: Every evening | ORAL | Status: DC | PRN
  Administered 2018-10-16: 50 mg via ORAL
  Filled 2018-10-14 (×2): qty 1

## 2018-10-14 MED ORDER — ONDANSETRON HCL 4 MG/2ML IJ SOLN: 2.0000 mg | Freq: Four times a day (QID) | INTRAMUSCULAR | Status: DC | PRN

## 2018-10-14 MED ORDER — ENOXAPARIN SODIUM 40 MG/0.4ML ~~LOC~~ SOLN
40.0000 mg | SUBCUTANEOUS | Status: DC
  Administered 2018-10-15 – 2018-10-22 (×8): 40 mg via SUBCUTANEOUS
  Filled 2018-10-14 (×9): qty 0.4

## 2018-10-14 MED ORDER — METHOCARBAMOL 500 MG PO TABS
500.0000 mg | ORAL_TABLET | Freq: Four times a day (QID) | ORAL | Status: DC | PRN
  Administered 2018-10-15 – 2018-10-20 (×8): 500 mg via ORAL
  Filled 2018-10-14 (×9): qty 1

## 2018-10-14 MED ORDER — MENTHOL 3 MG MT LOZG: 1.0000 | LOZENGE | OROMUCOSAL | Status: DC | PRN

## 2018-10-14 MED ORDER — FLEET ENEMA 7-19 GM/118ML RE ENEM: 1.0000 | ENEMA | Freq: Once | RECTAL | Status: DC | PRN

## 2018-10-14 MED ORDER — SACCHAROMYCES BOULARDII 250 MG PO CAPS
250.0000 mg | ORAL_CAPSULE | Freq: Two times a day (BID) | ORAL | Status: DC
  Administered 2018-10-14 – 2018-10-22 (×16): 250 mg via ORAL
  Filled 2018-10-14 (×16): qty 1

## 2018-10-14 MED ORDER — ADULT MULTIVITAMIN W/MINERALS CH
1.0000 | ORAL_TABLET | Freq: Every day | ORAL | Status: DC
  Administered 2018-10-15 – 2018-10-22 (×8): 1 via ORAL
  Filled 2018-10-14 (×8): qty 1

## 2018-10-14 MED ORDER — ONDANSETRON HCL 4 MG PO TABS
2.0000 mg | ORAL_TABLET | Freq: Four times a day (QID) | ORAL | Status: DC | PRN
  Administered 2018-10-16 – 2018-10-22 (×2): 2 mg via ORAL
  Filled 2018-10-14 (×2): qty 1

## 2018-10-14 MED ORDER — CHOLECALCIFEROL 10 MCG/ML (400 UNIT/ML) PO LIQD
2000.0000 [IU] | Freq: Every day | ORAL | Status: DC
  Administered 2018-10-15 – 2018-10-22 (×8): 2000 [IU] via ORAL
  Filled 2018-10-14 (×8): qty 5

## 2018-10-14 MED ORDER — VITAMIN C 500 MG PO TABS
500.0000 mg | ORAL_TABLET | Freq: Every day | ORAL | Status: DC
  Filled 2018-10-14: qty 1

## 2018-10-14 MED ORDER — ALUM & MAG HYDROXIDE-SIMETH 200-200-20 MG/5ML PO SUSP: 15.0000 mL | Freq: Four times a day (QID) | ORAL | Status: DC | PRN

## 2018-10-14 MED ORDER — CALCIUM CARBONATE ANTACID 500 MG PO CHEW
400.0000 mg | CHEWABLE_TABLET | Freq: Two times a day (BID) | ORAL | Status: DC
  Administered 2018-10-15 – 2018-10-22 (×15): 400 mg via ORAL
  Filled 2018-10-14 (×16): qty 2

## 2018-10-14 MED ORDER — CRANBERRY 200 MG PO CAPS: 1.0000 | ORAL_CAPSULE | Freq: Every day | ORAL | Status: DC

## 2018-10-14 MED ORDER — SENNOSIDES-DOCUSATE SODIUM 8.6-50 MG PO TABS
2.0000 | ORAL_TABLET | Freq: Every day | ORAL | Status: DC
  Administered 2018-10-14: 2 via ORAL
  Administered 2018-10-15: 1 via ORAL
  Administered 2018-10-16: 2 via ORAL
  Administered 2018-10-17: 1 via ORAL
  Administered 2018-10-18 – 2018-10-20 (×3): 2 via ORAL
  Administered 2018-10-21: 1 via ORAL
  Filled 2018-10-14 (×8): qty 2

## 2018-10-14 MED ORDER — CHOLECALCIFEROL 10 MCG/ML (400 UNIT/ML) PO LIQD
2000.0000 [IU] | Freq: Every day | ORAL | Status: DC
  Filled 2018-10-14 (×2): qty 5

## 2018-10-14 MED ORDER — HYDROCODONE-ACETAMINOPHEN 7.5-325 MG PO TABS
1.0000 | ORAL_TABLET | ORAL | Status: DC | PRN
  Administered 2018-10-14 – 2018-10-22 (×20): 1 via ORAL
  Filled 2018-10-14: qty 2
  Filled 2018-10-14 (×9): qty 1
  Filled 2018-10-14: qty 2
  Filled 2018-10-14 (×5): qty 1
  Filled 2018-10-14: qty 2
  Filled 2018-10-14 (×7): qty 1

## 2018-10-14 MED ORDER — BISACODYL 10 MG RE SUPP: 10.0000 mg | Freq: Every day | RECTAL | Status: DC | PRN

## 2018-10-14 MED ORDER — POLYETHYLENE GLYCOL 3350 17 G PO PACK: 17.0000 g | PACK | Freq: Every day | ORAL | Status: DC | PRN

## 2018-10-14 MED ORDER — ALUM & MAG HYDROXIDE-SIMETH 200-200-20 MG/5ML PO SUSP
30.0000 mL | ORAL | Status: DC | PRN
  Filled 2018-10-14: qty 30

## 2018-10-14 MED ORDER — ACETAMINOPHEN 325 MG PO TABS
325.0000 mg | ORAL_TABLET | ORAL | Status: DC | PRN
  Administered 2018-10-15: 325 mg via ORAL
  Administered 2018-10-16 – 2018-10-22 (×9): 650 mg via ORAL
  Filled 2018-10-14 (×9): qty 2
  Filled 2018-10-14: qty 1

## 2018-10-14 NOTE — H&P (Signed)
Physical Medicine and Rehabilitation Admission H&P    CC: Functional deficits due to right hip fracture.    HPI:  [name] is a 72 year old female with history frequent UTIs, recent bout with C. difficile colitis who tripped and fell night onto her right hip prior to admission on 10/08/2018.  History taken from chart review and patient. She was evaluated at [facility] ED and transferred to [facility] for treatment.  She underwent right unipolar hip arthroplasty by Dr. Carola Frost the same day.  Postop to be weightbearing as tolerated and recommendations for 30 days of Lovenox for DVT prophylaxis.  Lab work showed evidence of metabolic bone with vitamin D levels at 19.9 .  Ortho recommends vitamin D and calcium supplements with DEXA for work-up post discharge.  She has had issues with congestion, nausea as well as ABLA.   Review of Systems  Constitutional: Negative for chills and fever.  HENT: Negative for hearing loss and tinnitus.   Eyes: Negative for blurred vision and double vision.  Cardiovascular: Positive for leg swelling. Negative for chest pain and palpitations.  Gastrointestinal: Positive for heartburn and nausea. Negative for abdominal pain and constipation.  Genitourinary: Positive for frequency. Negative for dysuria.  Musculoskeletal: Positive for joint pain.  Skin: Negative for itching and rash.  Neurological: Negative for dizziness and headaches.  Psychiatric/Behavioral: The patient has insomnia (due to frequency last pm).   All other systems reviewed and are negative.    Past Medical History:  Diagnosis Date  . Arthritis    oa  . Complication of anesthesia   . Frequent UTI   . GERD (gastroesophageal reflux disease)   . History of Clostridium difficile infection   . PONV (postoperative nausea and vomiting)     Past Surgical History:  Procedure Laterality Date  . APPENDECTOMY    . CERVICAL FUSION     3 4 & 5   . HIP ARTHROPLASTY Right 10/08/2018   Procedure: ARTHROPLASTY BIPOLAR HIP (HEMIARTHROPLASTY);  Surgeon: Myrene Galas, MD;  Location: [facility] OR;  Service: Orthopedics;  Laterality: Right;  . OVARIAN CYST REMOVAL    . SHOULDER SURGERY      Family History  Problem Relation Age of Onset  . Heart disease Father   . High blood pressure Father   . High blood pressure Brother       Social History:   Married.  Husband has dementia and is a hospice patient.  Reports that she has never smoked. She has never used smokeless tobacco. She reports that she does not drink alcohol or use drugs.    Allergies  Allergen Reactions  . Estrogenic Substance Shortness Of Breath    Estrogen cream. Unable to breath per patient  . Influenza Vaccines Anaphylaxis  . Levofloxacin Other (See Comments)    Any fluoroquinolone drugs give her tendonitis Other Reaction: tendonitis   . Tetracycline Shortness Of Breath  . Amoxicillin-Pot Clavulanate Other (See Comments), Nausea Only and Nausea And Vomiting  . Aspirin Other (See Comments)    Other Reaction: GI Upset   . Penicillins Other (See Comments), Nausea Only and Nausea And Vomiting  . Prednisone Other (See Comments) and Nausea Only    "I just throw prednisone back up, but Kenalog has never bothered me"   . Sulfa Antibiotics Nausea Only and Nausea And Vomiting  . Cefaclor Other (See Comments)  . Cefdinir Diarrhea  . Cephalexin Other (See Comments)  . Ciprofloxacin Other (See Comments)  . Meloxicam Swelling  Tongue swelling  . Metronidazole Other (See Comments)    Severe headache  . Other Other (See Comments)    Any Floxin Drugs - tendonitis   . Solifenacin Other (See Comments)    Unable to void, had to be catheterized  . Gentamicin Rash    Medications Prior to Admission  Medication Sig Dispense Refill  . acetaminophen (TYLENOL) 500 MG tablet Take 1,000 mg by mouth every 6 (six) hours as needed for mild pain.    Marland Kitchen ibuprofen (ADVIL,MOTRIN) 200 MG tablet Take 400 mg by mouth every 6 (six)  hours as needed for mild pain.    Marland Kitchen lansoprazole (PREVACID) 30 MG capsule Take 30 mg by mouth every morning.  11    Drug Regimen Review  Drug regimen was reviewed and remains appropriate with no significant issues identified  Home: Home Living Family/patient expects to be discharged to:: Inpatient rehab Living Arrangements: Spouse/significant other Type of Home: House Home Access: Stairs to enter Entrance Stairs-Number of Steps: 2 Entrance Stairs-Rails: None Home Layout: One level Additional Comments: Pt's husband with dementia    Functional History: Prior Function Level of Independence: Independent  Functional Status:  Mobility: Bed Mobility Overal bed mobility: Needs Assistance Bed Mobility: Sit to Supine, Supine to Sit Supine to sit: Min assist Sit to supine: Min assist General bed mobility comments: cues for sequencing and technique with use of rail and leg lifter Transfers Overall transfer level: Needs assistance Equipment used: Rolling walker (2 wheeled) Transfers: Sit to/from Stand Sit to Stand: Min guard Stand pivot transfers: Min guard General transfer comment: min guard for safety Ambulation/Gait Ambulation/Gait assistance: Min guard Gait Distance (Feet): 150 Feet Assistive device: Rolling walker (2 wheeled) Gait Pattern/deviations: Step-to pattern, Decreased stance time - right, Decreased step length - left, Decreased weight shift to right General Gait Details: cues for increased cadence, posture and forward gaze; pt continues to improve with R LE mobility  Gait velocity: decreased    ADL: ADL Overall ADL's : Needs assistance/impaired Eating/Feeding: Independent Grooming: Set up, Sitting Upper Body Bathing: Set up, Sitting Lower Body Bathing: Maximal assistance, Sitting/lateral leans Lower Body Bathing Details (indicate cue type and reason): initiated education for AE (long handle sponge) Upper Body Dressing : Minimal assistance, Sitting Lower Body  Dressing: Maximal assistance, Sit to/from stand Lower Body Dressing Details (indicate cue type and reason): educated Pt in Water quality scientist, long handle shoe horn, sock donner Toilet Transfer: Min guard, Stand-pivot Toileting- Clothing Manipulation and Hygiene: Maximal assistance Toileting - Clothing Manipulation Details (indicate cue type and reason): educated in use of toilet aide - peri care is of high importance to her Tub/ Engineer, structural: Min guard, Minimal assistance, Stand-pivot, 3 in 1, Rolling walker Functional mobility during ADLs: Min guard, Minimal assistance, Rolling walker General ADL Comments: educated Pt on AE for LB ADL with application of posterior precautions  Cognition: Cognition Overall Cognitive Status: Within Functional Limits for tasks assessed Orientation Level: Oriented X4 Cognition Arousal/Alertness: Awake/alert Behavior During Therapy: WFL for tasks assessed/performed Overall Cognitive Status: Within Functional Limits for tasks assessed   Blood pressure (!) 151/55, pulse 70, temperature (!) 97.5 F (36.4 C), temperature source Oral, resp. rate 18, height 5' 4.02" (1.626 m), weight 78 kg, SpO2 98 %. Physical Exam  Nursing note and vitals reviewed. Constitutional: She is oriented to person, place, and time. She appears well-developed and well-nourished. No distress.  Cardiovascular: Normal rate and regular rhythm.  Respiratory: Effort normal and breath sounds normal.  GI: Soft. Bowel sounds are normal.  Musculoskeletal:  Right hip with edema and tenderness  Neurological: She is alert and oriented to person, place, and time.  Motor: Bilateral upper extremities: 5/5 proximal distal Left lower extremity: Hip flexion 4/5, knee extension 4+/5, ankle dorsiflexion 5/5 Right lower extremity: Hip flexion 4-/5, knee extension 4/5, ankle dorsiflexion 5/5 Sensation intact light touch   Skin: Skin is warm and dry. She is not diaphoretic.  Right hip incision C/D/I with  suture in place.  BLE with multiple telangiectasias.    Psychiatric: She has a normal mood and affect. Her behavior is normal.    Results for orders placed or performed during the hospital encounter of 10/08/18 (from the past 48 hour(s))  Urinalysis, Routine w reflex microscopic     Status: None   Collection Time: 10/14/18  9:57 AM  Result Value Ref Range   Color, Urine YELLOW YELLOW   APPearance CLEAR CLEAR   Specific Gravity, Urine 1.012 1.005 - 1.030   pH 8.0 5.0 - 8.0   Glucose, UA NEGATIVE NEGATIVE mg/dL   Hgb urine dipstick NEGATIVE NEGATIVE   Bilirubin Urine NEGATIVE NEGATIVE   Ketones, ur NEGATIVE NEGATIVE mg/dL   Protein, ur NEGATIVE NEGATIVE mg/dL   Nitrite NEGATIVE NEGATIVE   Leukocytes, UA NEGATIVE NEGATIVE    Comment: Performed at [facility], [address] [place], [place] [address]   No results found.     Medical Problem List and Plan: 1.  Deficits with mobility, transfers, endurance, self-care secondary to Right hip fracture s/p THA. 2.  DVT Prophylaxis/Anticoagulation: Pharmaceutical: Lovenox 3. Pain Management: Hydrocodone prn 4. Mood: LCSW to follow for evaluation and support.  5. Neuropsych: This patient is capable of making decisions on her own behalf. 6. Skin/Wound Care: Routine pressure relief measures.  7. Fluids/Electrolytes/Nutrition: Monitor I/O. Check lytes in am.  8. Metabolic bone disease: On Vitamin D and Calcium--patient has been refusing despite education.  9. ABLA : Monitor H/H with serial checks. Has been refusing MVI--will avoid iron due to ongoing nausea/ constipation issues.  10. 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 Will increase senna S to 2 pills at HS.   11.  Thrombocytopenia: Monitor for signs of bleeding. Recheck labs in am. 12.  Frequency/Urgency: UA negative--monitor for now. Recently completed 4-6 weeks oral vancomycin for C diff.   13. Low calorie malnutrition: Prealbumin levels low at 11.2.  Offer snacks between  meals as refusing supplements.   14. Hyperglycemia:  Stress induced v/s steroid induced--receive Kenalog inj for URI recently. Will check Hgb A1c in am.   Post Admission Physician Evaluation: 1. Preadmission assessment reviewed and changes made below. 2. Functional deficits secondary  to right hip fracture s/p THA. 3. Patient is admitted to receive collaborative, interdisciplinary care between the physiatrist, rehab nursing staff, and therapy team. 4. Patient has experienced substantial functional loss from his/her baseline which was documented above under the "Functional History" and "Functional Status" headings.  Judging by the patient's diagnosis, physical exam, and functional history, the patient has potential for functional progress which will result in measurable gains while on inpatient rehab.  These gains will be of substantial and practical use upon discharge  in facilitating mobility and self-care at the household level. 5. Physiatrist will provide 24 hour management of medical needs as well as oversight of the therapy plan/treatment and provide guidance as appropriate regarding the interaction of the two. 6. 24 hour rehab nursing will assist with bladder management, safety, skin/wound care, disease management, pain management and patient education  and help integrate therapy concepts, techniques,education, etc. 7. PT will assess and treat for/with: Lower extremity strength, range of motion, stamina, balance, functional mobility, safety, adaptive techniques and equipment, wound care, coping skills, pain control, education. Goals are: Mod I. 8. OT will assess and treat for/with: ADL's, functional mobility, safety, upper extremity strength, adaptive techniques and equipment, wound mgt, ego support, and community reintegration.   Goals are: Mod I. Therapy may proceed with showering this patient. 9. Case Management and Social Worker will assess and treat for psychological issues and discharge  planning. 10. Team conference will be held weekly to assess progress toward goals and to determine barriers to discharge. 11. Patient will receive at least 3 hours of therapy per day at least 5 days per week. 12. ELOS: 3-6 days.       13. Prognosis:  good  I have personally performed a face to face diagnostic evaluation, including, but not limited to relevant history and physical exam findings, of this patient and developed relevant assessment and plan.  Additionally, I have reviewed and concur with the physician assistant's documentation above.  The patient's status has not changed. The original post admission physician evaluation remains appropriate, and any changes from the pre-admission screening or documentation from the acute chart are noted above.    Maryla Morrow, MD, ABPMR Jacquelynn Cree, PA-C 10/14/2018

## 2018-10-14 NOTE — Progress Notes (Signed)
Pt requesting half of Zofran tablet instead of a whole tablet as she states, " It makes me constipated." 2 mg Zofran tablet administered to pt as requested.   Delories HeinzMelissa Haylee Mcanany, RN

## 2018-10-14 NOTE — Anesthesia Postprocedure Evaluation (Signed)
Anesthesia Post Note  Patient: [name]  Procedure(s) Performed: ARTHROPLASTY BIPOLAR HIP (HEMIARTHROPLASTY) (Right Hip)     Patient location during evaluation: PACU Anesthesia Type: General Level of consciousness: awake and alert Pain management: pain level controlled Vital Signs Assessment: post-procedure vital signs reviewed and stable Respiratory status: spontaneous breathing, nonlabored ventilation, respiratory function stable and patient connected to nasal cannula oxygen Cardiovascular status: blood pressure returned to baseline and stable Postop Assessment: no apparent nausea or vomiting Anesthetic complications: no    Last Vitals:  Vitals:   10/13/18 2211 10/14/18 0449  BP: (!) 145/65 (!) 151/55  Pulse: 78 70  Resp: 18 18  Temp: 36.8 C (!) 36.4 C  SpO2: 97% 98%    Last Pain:  Vitals:   10/14/18 0449  TempSrc: Oral  PainSc:                  [name]

## 2018-10-14 NOTE — Progress Notes (Signed)
Physical Medicine and Rehabilitation Consult   Reason for Consult: Functional deficits due to right hip fracture Referring Physician: Dr. Carola Frost   HPI: [name] is a 72 y.o. female with history of OA, frequent UTIs, C. difficile colitis-- currently on ? IV vancomycin taper who tripped and fell night prior to admission onto right hip with onset of pain extending to her knee.  History taken from chart review, patient, and daughter.  She was evaluated at [facility] ED and transferred to [facility] for treatment of her right femoral neck fracture..  She underwent right unipolar hip arthroplasty by Dr. Carola Frost on 10/08/2018.  Postop to be WBAT and on Lovenox for DVT prophylaxis.  Labs today shows evidence of leukocytosis.  Hospital course complicated by postoperative pain.  PT evaluations done today revealing functional deficits.  CIR recommended for follow-up therapy   Review of Systems  Gastrointestinal: Positive for constipation.  Musculoskeletal: Positive for joint pain and myalgias.  Neurological: Positive for weakness. Negative for sensory change.  All other systems reviewed and are negative.         Past Medical History:  Diagnosis Date  . Arthritis    oa  . Complication of anesthesia   . Frequent UTI   . GERD (gastroesophageal reflux disease)   . History of Clostridium difficile infection   . PONV (postoperative nausea and vomiting)          Past Surgical History:  Procedure Laterality Date  . APPENDECTOMY    . CERVICAL FUSION     3 4 & 5   . HIP ARTHROPLASTY Right 10/08/2018   Procedure: ARTHROPLASTY BIPOLAR HIP (HEMIARTHROPLASTY);  Surgeon: Myrene Galas, MD;  Location: [facility] OR;  Service: Orthopedics;  Laterality: Right;  . OVARIAN CYST REMOVAL    . SHOULDER SURGERY      History reviewed. No pertinent family history.    Social History: Married.  Husband has dementia and is a hospice patient.  Reports that she has never  smoked. She has never used smokeless tobacco. She reports that she does not drink alcohol or use drugs.        Allergies  Allergen Reactions  . Estrogenic Substance Shortness Of Breath    Estrogen cream. Unable to breath per patient  . Influenza Vaccines Anaphylaxis  . Levofloxacin Other (See Comments)    Any fluoroquinolone drugs give her tendonitis Other Reaction: tendonitis   . Tetracycline Shortness Of Breath  . Amoxicillin-Pot Clavulanate Other (See Comments), Nausea Only and Nausea And Vomiting  . Aspirin Other (See Comments)    Other Reaction: GI Upset   . Penicillins Other (See Comments), Nausea Only and Nausea And Vomiting  . Prednisone Other (See Comments) and Nausea Only    "I just throw prednisone back up, but Kenalog has never bothered me"   . Sulfa Antibiotics Nausea Only and Nausea And Vomiting  . Cefaclor Other (See Comments)  . Cefdinir Diarrhea  . Cephalexin Other (See Comments)  . Ciprofloxacin Other (See Comments)  . Meloxicam Swelling    Tongue swelling  . Metronidazole Other (See Comments)    Severe headache  . Other Other (See Comments)    Any Floxin Drugs - tendonitis   . Solifenacin Other (See Comments)    Unable to void, had to be catheterized  . Gentamicin Rash         Medications Prior to Admission  Medication Sig Dispense Refill  . acetaminophen (TYLENOL) 500 MG tablet Take 1,000 mg by mouth every  6 (six) hours as needed for mild pain.    Marland Kitchen. ibuprofen (ADVIL,MOTRIN) 200 MG tablet Take 400 mg by mouth every 6 (six) hours as needed for mild pain.    Marland Kitchen. lansoprazole (PREVACID) 30 MG capsule Take 30 mg by mouth every morning.  11    Home: Home Living Family/patient expects to be discharged to:: Inpatient rehab Living Arrangements: Spouse/significant other Type of Home: House Home Access: Stairs to enter Entrance Stairs-Number of Steps: 2 Entrance Stairs-Rails: None Home Layout: One level Additional Comments:  Pt's husband with dementia   Functional History: Prior Function Level of Independence: Independent Functional Status:  Mobility: Bed Mobility Overal bed mobility: Needs Assistance Bed Mobility: Supine to Sit Supine to sit: Mod assist General bed mobility comments: Mod A for RLE assist and assist with scooting hips to EOB.  Transfers Overall transfer level: Needs assistance Equipment used: Rolling walker (2 wheeled) Transfers: Sit to/from Stand, [employer] Pet[employer] CorporationStand Pivot Transfers Sit to Stand: Mod assist, From elevated surface Stand pivot transfers: Min assist, Mod assist General transfer comment: Mod A for lift assist and steadying. Cues for safe hand placement. Pt with limited weightshift to RLE secondary to pain. Pt with increased dizziness and nausea as well so mobility limited to stand pivot to recliner. Min to mod A for steadying assist. Cues for sequencing using RW.   ADL:  Cognition: Cognition Overall Cognitive Status: Within Functional Limits for tasks assessed Orientation Level: Oriented X4 Cognition Arousal/Alertness: Awake/alert Behavior During Therapy: WFL for tasks assessed/performed Overall Cognitive Status: Within Functional Limits for tasks assessed  Blood pressure (!) 127/58, pulse 86, temperature 98 F (36.7 C), temperature source Oral, resp. rate 16, height 5' 4.02" (1.626 m), weight 78 kg, SpO2 96 %. Physical Exam  Vitals reviewed. Constitutional: She is oriented to person, place, and time. She appears well-developed and well-nourished.  HENT:  Head: Normocephalic and atraumatic.  Eyes: EOM are normal.  Neck: Normal range of motion. Neck supple.  Cardiovascular: Normal rate and regular rhythm.  Respiratory: Effort normal and breath sounds normal.  GI: Soft. Bowel sounds are normal.  Musculoskeletal:  Right thigh edema and tenderness  Neurological: She is alert and oriented to person, place, and time.  Motor: Bilateral upper extremities: 5/5 proximal distal Left  lower extremity: Hip flexion 4/5, knee extension 4+/5, ankle dorsiflexion 5/5 Right lower extremity: Hip flexion 2/5, knee extension 4 medicine/5, ankle dorsiflexion 4+/5 Sensation intact light touch  Skin: Skin is warm and dry.  Right thigh with dressing C/D/I  Psychiatric:  Mildly slowed with slight delay in processing    LabResultsLast24Hours       Results for orders placed or performed during the hospital encounter of 10/08/18 (from the past 24 hour(s))  CBC     Status: Abnormal   Collection Time: 10/09/18  5:27 AM  Result Value Ref Range   WBC 11.1 (H) 4.0 - 10.5 K/uL   RBC 4.55 3.87 - 5.11 MIL/uL   Hemoglobin 12.4 12.0 - 15.0 g/dL   HCT 54.040.0 98.136.0 - 19.146.0 %   MCV 87.9 80.0 - 100.0 fL   MCH 27.3 26.0 - 34.0 pg   MCHC 31.0 30.0 - 36.0 g/dL   RDW 47.813.5 29.511.5 - 62.115.5 %   Platelets 137 (L) 150 - 400 K/uL   nRBC 0.0 0.0 - 0.2 %  Basic metabolic panel     Status: Abnormal   Collection Time: 10/09/18  5:27 AM  Result Value Ref Range   Sodium 136 135 - 145 mmol/L  Potassium 3.7 3.5 - 5.1 mmol/L   Chloride 103 98 - 111 mmol/L   CO2 24 22 - 32 mmol/L   Glucose, Bld 127 (H) 70 - 99 mg/dL   BUN 8 8 - 23 mg/dL   Creatinine, Ser 4.09 0.44 - 1.00 mg/dL   Calcium 8.6 (L) 8.9 - 10.3 mg/dL   GFR calc non Af Amer >60 >60 mL/min   GFR calc Af Amer >60 >60 mL/min   Anion gap 9 5 - 15      ImagingResults(Last48hours)  Dg Chest Portable 1 View  Result Date: 10/08/2018 CLINICAL DATA:  Preoperative examination prior to hip surgery. No current chest complaints. History of hypertension, never smoked. EXAM: PORTABLE CHEST 1 VIEW COMPARISON:  Portable chest x-ray of October 07, 2018 FINDINGS: The lungs are adequately inflated and clear. The heart and pulmonary vascularity are normal. The mediastinum is normal in width. The trachea is midline. The bony thorax exhibits no acute abnormality. IMPRESSION: There is no active cardiopulmonary disease. Electronically  Signed   By: David  [place] M.D.   On: 10/08/2018 12:03   Dg Hip Port Unilat With Pelvis 1v Right  Result Date: 10/09/2018 CLINICAL DATA:  Postop total right hip replacement. EXAM: DG HIP (WITH OR WITHOUT PELVIS) 1V PORT RIGHT COMPARISON:  Preoperative radiograph yesterday. FINDINGS: Unipolar right hip arthroplasty in expected alignment. No periprosthetic lucency or fracture. Recent postsurgical change includes air in soft tissue edema in the subcutaneous tissues. IMPRESSION: Right hip arthroplasty without immediate postoperative complication. Electronically Signed   By: Narda Rutherford M.D.   On: 10/09/2018 01:51     Assessment/Plan: Diagnosis: Right femoral necks fracture status post right hip arthroplasty Labs independently reviewed.  Records reviewed and summated above. Oral pharmacological pain control  Bowel program: consider colace, miralax and/or Senna. PRN suppository Fall precautions Monitor surgical wound and skin especially over pressure sensitive areas Prevent immobility complications: pressure ulcers, contractures, HO Consider modalities, such as TENs PT/OT consults for mobility strengthening, endurance training and adaptive ADLs    1. Does the need for close, 24 hr/day medical supervision in concert with the patient's rehab needs make it unreasonable for this patient to be served in a less intensive setting? Potentially  2. Co-Morbidities requiring supervision/potential complications: OA (ensure pain does not limit therapies), frequent UTIs, C. difficile colitis (? D/c IV vancomycin), leukocytosis (repeat labs, cont to monitor for signs and symptoms of infection, further workup if indicated), HTN (monitor and provide prns in accordance with increased physical exertion and pain), steroid-induced hyperglycemia (Monitor in accordance with exercise and adjust meds as necessary), postoperative pain (Biofeedback training with therapies to help reduce reliance on opiate and IV pain  medications, particularly IV Toradol, monitor pain control during therapies, and sedation at rest and titrate to maximum efficacy to ensure participation and gains in therapies) 3. Due to bladder management, bowel management, safety, skin/wound care, disease management, pain management and patient education, does the patient require 24 hr/day rehab nursing? Yes 4. Does the patient require coordinated care of a physician, rehab nurse, PT (1-2 hrs/day, 5 days/week) and OT (1-2 hrs/day, 5 days/week) to address physical and functional deficits in the context of the above medical diagnosis(es)? Yes Addressing deficits in the following areas: balance, endurance, locomotion, strength, transferring, bathing, dressing, toileting and psychosocial support 5. Can the patient actively participate in an intensive therapy program of at least 3 hrs of therapy per day at least 5 days per week? Yes 6. The potential for patient to make measurable gains  while on inpatient rehab is excellent 7. Anticipated functional outcomes upon discharge from inpatient rehab are supervision and min assist  with PT, supervision and min assist with OT, n/a with SLP. 8. Estimated rehab length of stay to reach the above functional goals is: 12-15 days. 9. Anticipated D/C setting: Other 10. Anticipated post D/C treatments: HH therapy and Home excercise program 11. Overall Rehab/Functional Prognosis: good  RECOMMENDATIONS: This patient's condition is appropriate for continued rehabilitative care in the following setting: Potentially CIR.  Will need to inquire about caregiver support as husband will be in hospice and patient will be returning home alone. Patient has agreed to participate in recommended program. Yes Note that insurance prior authorization may be required for reimbursement for recommended care.  Comment: Rehab Admissions Coordinator to follow up.   I have personally performed a face to face diagnostic evaluation,  including, but not limited to relevant history and physical exam findings, of this patient and developed relevant assessment and plan.  Additionally, I have reviewed and concur with the physician assistant's documentation above.   Maryla Morrow, MD, ABPMR Jacquelynn Cree, PA-C 10/09/2018        Revision History                   Routing History

## 2018-10-14 NOTE — Progress Notes (Signed)
[facility]th[facility]uma [facility] (OTS) Daily Progress Note   Subjective  Doing well this am  Has been up in chair since about 0800 Pain controlled  Thinks she may be getting a UTI, increasing frequency noted, no burning yet. Gets UTIs on a regular basis   Ambulated in hall yesterday x 2   Hopeful for CIR today   Review of Systems  Constitutional: Negative for chills and fever.  Respiratory: Negative for shortness of breath and wheezing.   Cardiovascular: Negative for chest pain and palpitations.  Gastrointestinal: Negative for abdominal pain, nausea and vomiting.  Neurological: Negative for tingling and sensory change.     Objective   BP (!) 151/55 (BP Location: Right Arm)   Pulse 70   Temp (!) 97.5 F (36.4 C) (Oral)   Resp 18   Ht 5' 4.02" (1.626 m) Comment: from 10/04/18 encounter  Wt 78 kg Comment: from 10/04/18 encounter  SpO2 98%   BMI 29.51 kg/m   Intake/Output      12/01 0701 - 12/02 0700 12/02 0701 - 12/03 0700   P.O. 120    Total Intake(mL/kg) 120 (1.5)    Urine (mL/kg/hr) 1400 (0.7)    Stool     Total Output 1400    Net -1280           Labs   Results for [name], [name] (MRN [id number]) as of 10/14/2018 09:43  Ref. Range 10/12/2018 03:38  PREALBUMIN Latest Ref Range: 18 - 38 mg/dL 78.211.2 (L)  WBC Latest Ref Range: 4.0 - 10.5 K/uL 6.8  RBC Latest Ref Range: 3.87 - 5.11 MIL/uL 3.83 (L)  Hemoglobin Latest Ref Range: 12.0 - 15.0 g/dL 95.610.6 (L)  HCT Latest Ref Range: 36.0 - 46.0 % 34.1 (L)  MCV Latest Ref Range: 80.0 - 100.0 fL 89.0  MCH Latest Ref Range: 26.0 - 34.0 pg 27.7  MCHC Latest Ref Range: 30.0 - 36.0 g/dL 21.331.1  RDW Latest Ref Range: 11.5 - 15.5 % 13.6  Platelets Latest Ref Range: 150 - 400 K/uL PLATELET CLUMPS NOTED ON SMEAR, UNABLE TO ESTIMATE  nRBC Latest Ref Range: 0.0 - 0.2 % 0.0   Results for [name], [name] (MRN [id number]) as of 10/14/2018 09:43  Ref. Range 10/08/2018 11:47  Vit D, 1,25-Dihydroxy Latest Ref Range: 19.9 - 79.3 pg/mL 36.9   Vitamin D, 25-Hydroxy Latest Ref Range: 30.0 - 100.0 ng/mL 19.9 (L)    Exam  Gen: awake and alert, sitting in bedside chair, appears well  Lungs: breathing unlabored Cardiac: RRR Ext:      Right Lower Extremity                          Dressing c/d/i   Ted hose in place                          Ext warm                          Minimal swelling                          No DCT                          Compartments are soft  Motor and sensory functions intact                         + DP pulse    Strength 4+/5    Assessment and Plan   POD/HD#: 35   72 y/o female s/p fall with R femoral neck fracture    - fall   -R femoral neck fracture s/p R hip hemiarthroplasty              WBAT              Posterior hip precautions             Dressing changes as needed                          Ok to clean wound with soap and water only                         Can leave dressing off once wound is dry                                       PT/OT             Ice PRN              TED hose    - Pain management:             Continue current regimen    - ABL anemia/Hemodynamics             Stable    - Medical issues              Per medical service    Urinary frequency    Check urinalysis and urine culture    - DVT/PE prophylaxis:             Lovenox x 30 days post op    - ID:              Periop abx completed    - Metabolic Bone Disease:             + vitamin d deficiency                         Replace vitamin d and calcium     Pt states in past she has had constipation with vit d capsules    Will try vitamin d liquid, 2000 IU's daily                          Vitamin c              outpt dexa    No other fractures in past   Mother had hip fracture in her 63's  Not on chronic steroids  Nonsmoker    pts current FRAX score with recent hip fracture accounted demonstrates a 10 probability of major osteoporotic fracture of 30% as well as 12%  probability of sustaining another hip fracture   This findings favor pharmacologic intervention for her osteoporosis as well as initiation of a resistance training program   - Activity:             OOB with assist  WBAT R leg             Posterior hip precautions    - FEN/GI prophylaxis/Foley/Lines:             Reg diet             protonix              - Impediments to fracture healing:             Osteoporosis    - Dispo:             Continue with therapy              CIR- hopefully today if insurance approves              Ortho issues stable- stable for DC       Mearl Latin, PA-C [phone] (C) 10/14/2018, 9:29 AM  [facility] [address] [place] [address] [phone] Collier Bullock (F)

## 2018-10-14 NOTE — Progress Notes (Signed)
Inpatient Rehabilitation-Admissions Coordinator   Met with pt at the bedside as follow up from PM&R consult. Pt willing to discuss CIR and interested in program. [facility] reviewed program details as well as anticipated level of assistance needed at DC from CIR. Pt unsure of support available to her but is calling son today to see if aide is available to assist her at home. Discussed that her candidacy is dependent upon assist available to her at DC as we are a short term program. Pt aware and verbalizes understanding. AC will follow up shortly regarding determination once caregiver support has been finalized.   Please call if questions.   Jhonnie Garner, OTR/L  Rehab Admissions Coordinator  [phone] 10/14/2018 11:05 AM

## 2018-10-14 NOTE — Progress Notes (Addendum)
Physical Therapy Treatment Patient Details Name: [name] MRN: [id number] DOB: 04/14/1946 Today's Date: 10/14/2018    History of Present Illness Pt is a 72 y/o female admitted following fall. Found to have R hip fracture, and is s/p R hip hemiarthroplasty. PMH includes HTN and cervical fusion.     PT Comments    Patient seen for mobility progression. This session focused on bed mobility and gait training. Pt continues to make progress toward PT goals and needs min guard/min A for all mobility (grossly min guard).  Continue to progress as tolerated.   Follow Up Recommendations  CIR;Supervision for mobility/OOB     Equipment Recommendations  Rolling walker with 5" wheels;3in1 (PT)    Recommendations for Other Services Rehab consult;OT consult     Precautions / Restrictions Precautions Precautions: Posterior Hip Precaution Comments: precautions reviewed with pt Restrictions Weight Bearing Restrictions: Yes RLE Weight Bearing: Weight bearing as tolerated RLE Partial Weight Bearing Percentage or Pounds: 50    Mobility  Bed Mobility Overal bed mobility: Needs Assistance Bed Mobility: Sit to Supine;Supine to Sit     Supine to sit: Min assist Sit to supine: Min assist   General bed mobility comments: cues for sequencing and technique with use of rail and leg lifter  Transfers Overall transfer level: Needs assistance Equipment used: Rolling walker (2 wheeled) Transfers: Sit to/from Stand Sit to Stand: Min guard         General transfer comment: min guard for safety  Ambulation/Gait Ambulation/Gait assistance: Min guard Gait Distance (Feet): 150 Feet Assistive device: Rolling walker (2 wheeled) Gait Pattern/deviations: Step-to pattern;Decreased stance time - right;Decreased step length - left;Decreased weight shift to right Gait velocity: decreased   General Gait Details: cues for increased cadence, posture and forward gaze; pt continues to improve with R LE  mobility    Stairs             Wheelchair Mobility    Modified Rankin (Stroke Patients Only)       Balance Overall balance assessment: Needs assistance Sitting-balance support: No upper extremity supported;Feet supported Sitting balance-Leahy Scale: Fair     Standing balance support: Bilateral upper extremity supported;During functional activity Standing balance-Leahy Scale: Poor                              Cognition Arousal/Alertness: Awake/alert Behavior During Therapy: WFL for tasks assessed/performed Overall Cognitive Status: Within Functional Limits for tasks assessed                                        Exercises      General Comments General comments (skin integrity, edema, etc.): pt nauseated when returning to room and with emesis. RN notified. Pt reports feeling better       Pertinent Vitals/Pain Pain Assessment: Faces Faces Pain Scale: Hurts little more Pain Location: R hip/groin Pain Descriptors / Indicators: Sore;Guarding Pain Intervention(s): Limited activity within patient's tolerance;Monitored during session;Premedicated before session;Repositioned    Home Living                      Prior Function            PT Goals (current goals can now be found in the care plan section) Acute Rehab PT Goals Patient Stated Goal: to get stronger before going home  Progress towards  PT goals: Progressing toward goals    Frequency    Min 5X/week      PT Plan Current plan remains appropriate    Co-evaluation              AM-PAC PT "6 Clicks" Mobility   Outcome Measure  Help needed turning from your back to your side while in a flat bed without using bedrails?: A Lot Help needed moving from lying on your back to sitting on the side of a flat bed without using bedrails?: A Lot Help needed moving to and from a bed to a chair (including a wheelchair)?: A Little Help needed standing up from a chair  using your arms (e.g., wheelchair or bedside chair)?: A Little Help needed to walk in hospital room?: A Little Help needed climbing 3-5 steps with a railing? : A Lot 6 Click Score: 15    End of Session Equipment Utilized During Treatment: Gait belt Activity Tolerance: Patient tolerated treatment well Patient left: with call bell/phone within reach;in bed Nurse Communication: Mobility status PT Visit Diagnosis: Unsteadiness on feet (R26.81);Muscle weakness (generalized) (M62.81);Pain Pain - Right/Left: Right Pain - part of body: Hip     Time: 1610-9604 PT Time Calculation (min) (ACUTE ONLY): 45 min  Charges:  $Gait Training: 23-37 mins $Therapeutic Activity: 8-22 mins                     Erline Levine, PTA Acute Rehabilitation Services Pager: [phone] Office: [phone]     [name] 10/14/2018, 12:44 PM

## 2018-10-14 NOTE — PMR Pre-admission (Addendum)
PMR Admission Coordinator Pre-Admission Assessment  Patient: [name] is an 72 y.o., female MRN: [id number] DOB: 27-Sep-1946 Height: 5' 4.02" (162.6 cm)(from 10/04/18 encounter) Weight: 78 kg(from 10/04/18 encounter)              Insurance Information HMO:     PPO:      PCP:      IPA:      80/20: Yes     OTHER:  PRIMARY: Medicare Part A and B      Policy#: [id number]      Subscriber: Patient CM Name:       Phone#:      Fax#:  Pre-Cert#:       Employer:  Benefits:  Phone #:      Name: Verified eligibility via [employer] on 10/14/18 Eff. Date: Part A effective: 11/13/10; Part B effective: 11/13/10     Deduct: $1,364      Out of Pocket Max: NA      Life Max: NA CIR: Covered per Medicare guidelines once yearly deductible is met      SNF: days 1-20, 100%; days 21-100, 80% Outpatient: 80%     Co-Pay: 20% Home Health: 100%      Co-Pay:  DME: 80%     Co-Pay: 20% Providers: Pt's choice SECONDARY: BCBS     Policy#: [id number]      Subscriber: Patient CM Name:       Phone#:      Fax#:  Pre-Cert#:       Employer:  Benefits:  Phone #: [phone]     Name:  Eff. Date:      Deduct:       Out of Pocket Max:       Life Max:  CIR:       SNF:  Outpatient:      Co-Pay:  Home Health:       Co-Pay:  DME:      Co-Pay:  Medicaid Application Date:       Case Manager:  Disability Application Date:       Case Worker:   Emergency Contact Information Contact Information    Name Relation Home Work Mobile   [name]    [place] [phone]     [place]   [place], [place]    [phone]     Current Medical History  Patient Admitting Diagnosis: Right femoral necks fracture status post right hip arthroplasty History of Present Illness:  [name] is a 72 y.o. female with history of OA, frequent UTIs, C. difficile colitis-- currently on ? IV vancomycin taper who tripped and fell night prior to admission onto right hip with onset of pain  extending to her knee.  History taken from chart review, patient, and daughter.  She was evaluated at [facility] ED and transferred to [facility] for treatment of her right femoral neck fracture..  She underwent right unipolar hip arthroplasty by Dr. Marcelino Scot on 10/08/2018.  Postop to be WBAT and on Lovenox for DVT prophylaxis.  Labs today shows evidence of leukocytosis.  Hospital course complicated by postoperative pain.  PT evaluations done revealing functional deficits.  CIR recommended for follow-up therapy. Pt is to be admitted to CIR on 10/14/18.       Past Medical History  Past Medical History:  Diagnosis Date  . Arthritis    oa  . Complication of anesthesia   . Frequent UTI   . GERD (gastroesophageal reflux disease)   .  History of Clostridium difficile infection   . PONV (postoperative nausea and vomiting)     Family History  family history is not on file.  Prior Rehab/Hospitalizations:  Has the patient had major surgery during 100 days prior to admission? No  Current Medications   Current Facility-Administered Medications:  .  acetaminophen (TYLENOL) tablet 325-650 mg, 325-650 mg, Oral, Q6H PRN, Ainsley Spinner, PA-C, 650 mg at 10/10/18 0941 .  alum & mag hydroxide-simeth (MAALOX/MYLANTA) 200-200-20 MG/5ML suspension 15 mL, 15 mL, Oral, Q6H PRN, Domenic Polite, MD, 15 mL at 10/12/18 1024 .  calcium citrate (CALCITRATE - dosed in mg elemental calcium) tablet 200 mg of elemental calcium, 200 mg of elemental calcium, Oral, BID, Ainsley Spinner, PA-C, 200 mg of elemental calcium at 10/11/18 2218 .  cholecalciferol (D-VI-SOL) 10 MCG/ML oral liquid 2,000 Units, 2,000 Units, Oral, Daily, Ainsley Spinner, PA-C .  enoxaparin (LOVENOX) injection 40 mg, 40 mg, Subcutaneous, Q24H, Ainsley Spinner, PA-C, 40 mg at 10/14/18 8144 .  feeding supplement (BOOST / RESOURCE BREEZE) liquid 1 Container, 1 Container, Oral, TID BM, Domenic Polite, MD, 1 Container at 10/11/18 1433 .  HYDROcodone-acetaminophen  (NORCO) 7.5-325 MG per tablet 1-2 tablet, 1-2 tablet, Oral, Q4H PRN, Ainsley Spinner, PA-C, 1 tablet at 10/13/18 1809 .  HYDROcodone-acetaminophen (NORCO/VICODIN) 5-325 MG per tablet 1-2 tablet, 1-2 tablet, Oral, Q4H PRN, Ainsley Spinner, PA-C, 2 tablet at 10/14/18 0853 .  menthol-cetylpyridinium (CEPACOL) lozenge 3 mg, 1 lozenge, Oral, PRN **OR** phenol (CHLORASEPTIC) mouth spray 1 spray, 1 spray, Mouth/Throat, PRN, Ainsley Spinner, PA-C .  methocarbamol (ROBAXIN) tablet 500 mg, 500 mg, Oral, Q6H PRN, 500 mg at 10/11/18 1422 **OR** methocarbamol (ROBAXIN) 500 mg in dextrose 5 % 50 mL IVPB, 500 mg, Intravenous, Q6H PRN, Ainsley Spinner, PA-C, Last Rate: 100 mL/hr at 10/08/18 1455, 500 mg at 10/08/18 1455 .  morphine 2 MG/ML injection 2 mg, 2 mg, Intravenous, Q2H PRN, Ainsley Spinner, PA-C, 2 mg at 10/08/18 1451 .  multivitamin with minerals tablet 1 tablet, 1 tablet, Oral, Daily, Domenic Polite, MD .  ondansetron [facility]) injection 4 mg, 4 mg, Intravenous, Q6H PRN, Ainsley Spinner, PA-C, 4 mg at 10/09/18 1811 .  ondansetron (ZOFRAN) tablet 4 mg, 4 mg, Oral, Q6H PRN, 2 mg at 10/14/18 1137 **OR** ondansetron (ZOFRAN) injection 4 mg, 4 mg, Intravenous, Q6H PRN, Ainsley Spinner, PA-C .  pantoprazole (PROTONIX) EC tablet 40 mg, 40 mg, Oral, Daily, Ainsley Spinner, PA-C, 40 mg at 10/14/18 8185 .  phenol (CHLORASEPTIC) mouth spray 1 spray, 1 spray, Mouth/Throat, PRN, Domenic Polite, MD, 1 spray at 10/10/18 0414 .  polyethylene glycol (MIRALAX / GLYCOLAX) packet 17 g, 17 g, Oral, Daily, Ainsley Spinner, PA-C, 17 g at 10/11/18 1138 .  senna-docusate (Senokot-S) tablet 1 tablet, 1 tablet, Oral, QHS PRN, Domenic Polite, MD, 1 tablet at 10/13/18 2223 .  vitamin C (ASCORBIC ACID) tablet 500 mg, 500 mg, Oral, Daily, Ainsley Spinner, PA-C, 500 mg at 10/12/18 6314  Patients Current Diet:  Diet Order            Diet regular Room service appropriate? Yes; Fluid consistency: Thin  Diet effective now              Precautions /  Restrictions Precautions Precautions: Posterior Hip Precaution Booklet Issued: Yes (comment) Precaution Comments: precautions reviewed with pt Restrictions Weight Bearing Restrictions: Yes RLE Weight Bearing: Weight bearing as tolerated RLE Partial Weight Bearing Percentage or Pounds: 50   Has the patient had 2 or more falls or a fall with  injury in the past year?No  Prior Activity Level Community (5-7x/wk): active PTA; drove, retired; independent Radiographer, therapeutic / [place] Devices/Equipment: Eyeglasses  Prior Device Use: Indicate devices/aids used by the patient prior to current illness, exacerbation or injury? None of the above  Prior Functional Level Prior Function Level of Independence: Independent  Self Care: Did the patient need help bathing, dressing, using the toilet or eating?  Independent  Indoor Mobility: Did the patient need assistance with walking from room to room (with or without device)? Independent  Stairs: Did the patient need assistance with internal or external stairs (with or without device)? Independent  Functional Cognition: Did the patient need help planning regular tasks such as shopping or remembering to take medications? Independent  Current Functional Level Cognition  Overall Cognitive Status: Within Functional Limits for tasks assessed Orientation Level: Oriented X4    Extremity Assessment (includes Sensation/Coordination)  Upper Extremity Assessment: Overall WFL for tasks assessed  Lower Extremity Assessment: RLE deficits/detail RLE Deficits / Details: Deficits consistent with post op pain and weakness.     ADLs  Overall ADL's : Needs assistance/impaired Eating/Feeding: Independent Grooming: Set up, Sitting Upper Body Bathing: Set up, Sitting Lower Body Bathing: Maximal assistance, Sitting/lateral leans Lower Body Bathing Details (indicate cue type and reason): initiated education for AE (long handle  sponge) Upper Body Dressing : Minimal assistance, Sitting Lower Body Dressing: Maximal assistance, Sit to/from stand Lower Body Dressing Details (indicate cue type and reason): educated Pt in reacher/grabber, long handle shoe horn, sock donner Toilet Transfer: Min guard, Stand-pivot Toileting- Clothing Manipulation and Hygiene: Maximal assistance Toileting - Clothing Manipulation Details (indicate cue type and reason): educated in use of toilet aide - peri care is of high importance to her Tub/ Banker: Min guard, Minimal assistance, Stand-pivot, 3 in 1, Rolling walker Functional mobility during ADLs: Min guard, Minimal assistance, Rolling walker General ADL Comments: educated Pt on AE for LB ADL with application of posterior precautions    Mobility  Overal bed mobility: Needs Assistance Bed Mobility: Sit to Supine, Supine to Sit Supine to sit: Min assist Sit to supine: Min assist General bed mobility comments: cues for sequencing and technique with use of rail and leg lifter    Transfers  Overall transfer level: Needs assistance Equipment used: Rolling walker (2 wheeled) Transfers: Sit to/from Stand Sit to Stand: Min guard Stand pivot transfers: Min guard General transfer comment: min guard for safety    Ambulation / Gait / Stairs / Wheelchair Mobility  Ambulation/Gait Ambulation/Gait assistance: Counsellor (Feet): 150 Feet Assistive device: Rolling walker (2 wheeled) Gait Pattern/deviations: Step-to pattern, Decreased stance time - right, Decreased step length - left, Decreased weight shift to right General Gait Details: cues for increased cadence, posture and forward gaze; pt continues to improve with R LE mobility  Gait velocity: decreased    Posture / Balance Balance Overall balance assessment: Needs assistance Sitting-balance support: No upper extremity supported, Feet supported Sitting balance-Leahy Scale: Fair Standing balance support: Bilateral  upper extremity supported, During functional activity Standing balance-Leahy Scale: Poor Standing balance comment: Reliant on BUE support     Special needs/care consideration BiPAP/CPAP: no CPM: no Continuous Drip IV: Robaxin  Dialysis: no        Days: no Life Vest: no Oxygen: no Special Bed: no Trach Size: no Wound Vac (area): no      Location: no Skin:  Closed hip incision on right;  Bowel mgmt: last BM: 10/13/18, continent  Bladder mgmt: continent Diabetic mgmt: no     Previous Home Environment Living Arrangements: Spouse/significant other Type of Home: House Home Layout: One level Home Access: Stairs to enter Entrance Stairs-Rails: None Entrance Stairs-Number of Steps: 2 Home Care Services: No Additional Comments: Pt's husband with dementia   Discharge Living Setting Plans for Discharge Living Setting: Patient's home, Alone, Other (Comment)(will have hired help and family check in) Type of Home at Discharge: House Discharge Home Layout: One level Discharge Home Access: Stairs to enter Entrance Stairs-Rails: None(family plans to put in next week) Entrance Stairs-Number of Steps: 2 Discharge Bathroom Shower/Tub: Tub/shower unit Discharge Bathroom Toilet: Standard Discharge Bathroom Accessibility: Yes How Accessible: Accessible via walker Does the patient have any problems obtaining your medications?: No  Social/Family/Support Systems Patient Roles: Spouse Contact Information: Jeanett Schlein [phone]); son Aaron Edelman): [phone]; son plans to hire help as needed(caregiver will be sister in Estate agent) Anticipated Caregiver: [place]fe[place] up to 6 hours intermittantly; plus son to hire caregivers PRN Anticipated Caregiver's Contact Information: see above Ability/Limitations of Caregiver: Min A Caregiver Availability: Intermittent Discharge Plan Discussed with Primary Caregiver: Yes(Jeannette and Son) Is Caregiver In Agreement with Plan?: Yes Does Caregiver/Family have  Issues with Lodging/Transportation while Pt is in Rehab?: No   Goals/Additional Needs Patient/Family Goal for Rehab: PT/OT: Supervision/Min A; SLP: NA Expected length of stay: 12-15 days Cultural Considerations: [employer] Dietary Needs: regular, thin liquids Equipment Needs: TBD Pt/Family Agrees to Admission and willing to participate: Yes Program Orientation Provided & Reviewed with Pt/Caregiver Including Roles  & Responsibilities: Yes(pt, son, and Car[place]onna)  Barriers to Discharge: Decreased caregiver support, Home environment access/layout, Weight bearing restrictions  Barriers to Discharge Comments: steps to enter   Decrease burden of Care through IP rehab admission: NA   Possible need for SNF placement upon discharge: not anticipated; family willing to hire assist at [facility] if needed and pt has good prognosis for further progress.    Patient Condition: This patient's medical and functional status has changed since the consult dated 10/09/18 in which the Rehabilitation Physician determined and documented that the patient was potentially appropriate for intensive rehabilitative care in an inpatient rehabilitation facility. Issues have been addressed and update has been discussed with Dr. Posey Pronto and patient now appropriate for inpatient rehabilitation. Pt will have adequate caregiver support at DC as confirmed by family. Will admit to inpatient rehab today.   Preadmission Screen Completed By:  Jhonnie Garner, 10/14/2018 12:52 PM ______________________________________________________________________   Discussed status with Dr. Posey Pronto on 10/14/18 at 12:46PM and received telephone approval for admission today.  Admission Coordinator:  Jhonnie Garner, time 12:46PM/Date: 10/14/18

## 2018-10-14 NOTE — Progress Notes (Signed)
Alicia Arn, MD  Physician  Physical Medicine and Rehabilitation  PMR Pre-admission  Addendum  Date of Service:  10/14/2018 12:09 PM       Related encounter: Admission (Discharged) from 10/08/2018 in [place]         PMR Admission Coordinator Pre-Admission Assessment  Patient: [name] is an 72 y.o., female MRN: [id number] DOB: 12-25-1945 Height: 5' 4.02" (162.6 cm)(from 10/04/18 encounter) Weight: 78 kg(from 10/04/18 encounter)                                                                                                                                                  Insurance Information HMO:     PPO:      PCP:      IPA:      80/20: Yes     OTHER:  PRIMARY: Medicare Part A and B      Policy#: [id number]      Subscriber: Patient CM Name:       Phone#:      Fax#:  Pre-Cert#:       Employer:  Benefits:  Phone #:      Name: Verified eligibility via [employer] on 10/14/18 Eff. Date: Part A effective: 11/13/10; Part B effective: 11/13/10     Deduct: $1,364      Out of Pocket Max: NA      Life Max: NA CIR: Covered per Medicare guidelines once yearly deductible is met      SNF: days 1-20, 100%; days 21-100, 80% Outpatient: 80%     Co-Pay: 20% Home Health: 100%      Co-Pay:  DME: 80%     Co-Pay: 20% Providers: Pt's choice SECONDARY: BCBS     Policy#: [id number]      Subscriber: Patient CM Name:       Phone#:      Fax#:  Pre-Cert#:       Employer:  Benefits:  Phone #: [phone]     Name:  Eff. Date:      Deduct:       Out of Pocket Max:       Life Max:  CIR:       SNF:  Outpatient:      Co-Pay:  Home Health:       Co-Pay:  DME:      Co-Pay:  Medicaid Application Date:       Case Manager:  Disability Application Date:       Case Worker:   Emergency Publishing copy Information    Name Relation Home Work Mobile   [name]    [place] [phone]      [place]   [name]    [phone]     Current  Medical History  Patient Admitting Diagnosis: Right femoral necks fracture status post right hip arthroplasty History of Present Illness: [name] a 72 y.o.femalewith history of OA, frequent UTIs, C. difficile colitis--currently on ? IVvancomycin taper who tripped and fell night prior to admission onto right hip with onset of pain extending to her knee.History taken from chart review, patient, and daughter.She was evaluated at [facility] ED and transferred to [facility] for treatment of her right femoral neck fracture.. She underwent right unipolar hip arthroplasty by Dr. Marcelino Scot on 10/08/2018. Postop to be WBAT and on Lovenox for DVT prophylaxis.Labs today shows evidence of leukocytosis. Hospital course complicated by postoperative pain.PT evaluations done revealing functional deficits. CIR recommended for follow-up therapy. Pt is to be admitted to CIR on 10/14/18.   Past Medical History      Past Medical History:  Diagnosis Date  . Arthritis    oa  . Complication of anesthesia   . Frequent UTI   . GERD (gastroesophageal reflux disease)   . History of Clostridium difficile infection   . PONV (postoperative nausea and vomiting)     Family History  family history is not on file.  Prior Rehab/Hospitalizations:  Has the patient had major surgery during 100 days prior to admission? No  Current Medications   Current Facility-Administered Medications:  .  acetaminophen (TYLENOL) tablet 325-650 mg, 325-650 mg, Oral, Q6H PRN, Ainsley Spinner, PA-C, 650 mg at 10/10/18 0941 .  alum & mag hydroxide-simeth (MAALOX/MYLANTA) 200-200-20 MG/5ML suspension 15 mL, 15 mL, Oral, Q6H PRN, Domenic Polite, MD, 15 mL at 10/12/18 1024 .  calcium citrate (CALCITRATE - dosed in mg elemental calcium) tablet 200 mg of elemental calcium, 200 mg of elemental calcium, Oral, BID, Ainsley Spinner, PA-C, 200 mg of elemental calcium at 10/11/18 2218 .  cholecalciferol (D-VI-SOL) 10 MCG/ML oral liquid 2,000 Units, 2,000 Units, Oral, Daily, Ainsley Spinner, PA-C .  enoxaparin (LOVENOX) injection 40 mg, 40 mg, Subcutaneous, Q24H, Ainsley Spinner, PA-C, 40 mg at 10/14/18 2671 .  feeding supplement (BOOST / RESOURCE BREEZE) liquid 1 Container, 1 Container, Oral, TID BM, Domenic Polite, MD, 1 Container at 10/11/18 1433 .  HYDROcodone-acetaminophen (NORCO) 7.5-325 MG per tablet 1-2 tablet, 1-2 tablet, Oral, Q4H PRN, Ainsley Spinner, PA-C, 1 tablet at 10/13/18 1809 .  HYDROcodone-acetaminophen (NORCO/VICODIN) 5-325 MG per tablet 1-2 tablet, 1-2 tablet, Oral, Q4H PRN, Ainsley Spinner, PA-C, 2 tablet at 10/14/18 0853 .  menthol-cetylpyridinium (CEPACOL) lozenge 3 mg, 1 lozenge, Oral, PRN **OR** phenol (CHLORASEPTIC) mouth spray 1 spray, 1 spray, Mouth/Throat, PRN, Ainsley Spinner, PA-C .  methocarbamol (ROBAXIN) tablet 500 mg, 500 mg, Oral, Q6H PRN, 500 mg at 10/11/18 1422 **OR** methocarbamol (ROBAXIN) 500 mg in dextrose 5 % 50 mL IVPB, 500 mg, Intravenous, Q6H PRN, Ainsley Spinner, PA-C, Last Rate: 100 mL/hr at 10/08/18 1455, 500 mg at 10/08/18 1455 .  morphine 2 MG/ML injection 2 mg, 2 mg, Intravenous, Q2H PRN, Ainsley Spinner, PA-C, 2 mg at 10/08/18 1451 .  multivitamin with minerals tablet 1 tablet, 1 tablet, Oral, Daily, Domenic Polite, MD .  ondansetron [facility]) injection 4 mg, 4 mg, Intravenous, Q6H PRN, Ainsley Spinner, PA-C, 4 mg at 10/09/18 1811 .  ondansetron (ZOFRAN) tablet 4 mg, 4 mg, Oral, Q6H PRN, 2 mg at 10/14/18 1137 **OR** ondansetron (ZOFRAN) injection 4 mg, 4 mg, Intravenous, Q6H PRN, Ainsley Spinner, PA-C .  pantoprazole (PROTONIX) EC tablet 40 mg, 40 mg, Oral, Daily, Ainsley Spinner, PA-C, 40 mg at 10/14/18 2458 .  phenol (CHLORASEPTIC)  mouth spray 1 spray, 1 spray, Mouth/Throat, PRN, Domenic Polite, MD, 1 spray at 10/10/18 0414 .  polyethylene glycol (MIRALAX / GLYCOLAX) packet 17 g, 17 g, Oral, Daily, Ainsley Spinner, PA-C, 17 g at 10/11/18 1138 .  senna-docusate (Senokot-S) tablet 1 tablet, 1 tablet, Oral, QHS PRN, Domenic Polite, MD, 1 tablet at 10/13/18 2223 .  vitamin C (ASCORBIC ACID) tablet 500 mg, 500 mg, Oral, Daily, Ainsley Spinner, PA-C, 500 mg at 10/12/18 4782  Patients Current Diet:     Diet Order                  Diet regular Room service appropriate? Yes; Fluid consistency: Thin  Diet effective now               Precautions / Restrictions Precautions Precautions: Posterior Hip Precaution Booklet Issued: Yes (comment) Precaution Comments: precautions reviewed with pt Restrictions Weight Bearing Restrictions: Yes RLE Weight Bearing: Weight bearing as tolerated RLE Partial Weight Bearing Percentage or Pounds: 50   Has the patient had 2 or more falls or a fall with injury in the past year?No  Prior Activity Level Community (5-7x/wk): active PTA; drove, retired; independent Radiographer, therapeutic / [place] Devices/Equipment: Eyeglasses  Prior Device Use: Indicate devices/aids used by the patient prior to current illness, exacerbation or injury? None of the above  Prior Functional Level Prior Function Level of Independence: Independent  Self Care: Did the patient need help bathing, dressing, using the toilet or eating?  Independent  Indoor Mobility: Did the patient need assistance with walking from room to room (with or without device)? Independent  Stairs: Did the patient need assistance with internal or external stairs (with or without device)? Independent  Functional Cognition: Did the patient need help planning regular tasks such as shopping or remembering to take medications? Independent  Current Functional Level Cognition  Overall Cognitive Status: Within Functional Limits for tasks assessed Orientation Level: Oriented X4    Extremity Assessment (includes Sensation/Coordination)  Upper Extremity Assessment: Overall  WFL for tasks assessed  Lower Extremity Assessment: RLE deficits/detail RLE Deficits / Details: Deficits consistent with post op pain and weakness.     ADLs  Overall ADL's : Needs assistance/impaired Eating/Feeding: Independent Grooming: Set up, Sitting Upper Body Bathing: Set up, Sitting Lower Body Bathing: Maximal assistance, Sitting/lateral leans Lower Body Bathing Details (indicate cue type and reason): initiated education for AE (long handle sponge) Upper Body Dressing : Minimal assistance, Sitting Lower Body Dressing: Maximal assistance, Sit to/from stand Lower Body Dressing Details (indicate cue type and reason): educated Pt in reacher/grabber, long handle shoe horn, sock donner Toilet Transfer: Min guard, Stand-pivot Toileting- Clothing Manipulation and Hygiene: Maximal assistance Toileting - Clothing Manipulation Details (indicate cue type and reason): educated in use of toilet aide - peri care is of high importance to her Tub/ Banker: Min guard, Minimal assistance, Stand-pivot, 3 in 1, Rolling walker Functional mobility during ADLs: Min guard, Minimal assistance, Rolling walker General ADL Comments: educated Pt on AE for LB ADL with application of posterior precautions    Mobility  Overal bed mobility: Needs Assistance Bed Mobility: Sit to Supine, Supine to Sit Supine to sit: Min assist Sit to supine: Min assist General bed mobility comments: cues for sequencing and technique with use of rail and leg lifter    Transfers  Overall transfer level: Needs assistance Equipment used: Rolling walker (2 wheeled) Transfers: Sit to/from Stand Sit to Stand: Min guard Stand pivot transfers: Min guard  General transfer comment: min guard for safety    Ambulation / Gait / Stairs / Wheelchair Mobility  Ambulation/Gait Ambulation/Gait assistance: Counsellor (Feet): 150 Feet Assistive device: Rolling walker (2 wheeled) Gait Pattern/deviations: Step-to  pattern, Decreased stance time - right, Decreased step length - left, Decreased weight shift to right General Gait Details: cues for increased cadence, posture and forward gaze; pt continues to improve with R LE mobility  Gait velocity: decreased    Posture / Balance Balance Overall balance assessment: Needs assistance Sitting-balance support: No upper extremity supported, Feet supported Sitting balance-Leahy Scale: Fair Standing balance support: Bilateral upper extremity supported, During functional activity Standing balance-Leahy Scale: Poor Standing balance comment: Reliant on BUE support     Special needs/care consideration BiPAP/CPAP: no CPM: no Continuous Drip IV: Robaxin  Dialysis: no        Days: no Life Vest: no Oxygen: no Special Bed: no Trach Size: no Wound Vac (area): no      Location: no Skin:  Closed hip incision on right;  Bowel mgmt: last BM: 10/13/18, continent Bladder mgmt: continent Diabetic mgmt: no     Previous Home Environment Living Arrangements: Spouse/significant other Type of Home: House Home Layout: One level Home Access: Stairs to enter Entrance Stairs-Rails: None Entrance Stairs-Number of Steps: 2 Home Care Services: No Additional Comments: Pt's husband with dementia   Discharge Living Setting Plans for Discharge Living Setting: Patient's home, Alone, Other (Comment)(will have hired help and family check in) Type of Home at Discharge: House Discharge Home Layout: One level Discharge Home Access: Stairs to enter Entrance Stairs-Rails: None(family plans to put in next week) Entrance Stairs-Number of Steps: 2 Discharge Bathroom Shower/Tub: Tub/shower unit Discharge Bathroom Toilet: Standard Discharge Bathroom Accessibility: Yes How Accessible: Accessible via walker Does the patient have any problems obtaining your medications?: No  Social/Family/Support Systems Patient Roles: Spouse Contact Information: Jeanett Schlein [phone]); son  Aaron Edelman): [phone]; son plans to hire help as needed(caregiver will be sister in Estate agent) Anticipated Caregiver: Finkbiner up to 6 hours intermittantly; plus son to hire caregivers PRN Anticipated Caregiver's Contact Information: see above Ability/Limitations of Caregiver: Min A Caregiver Availability: Intermittent Discharge Plan Discussed with Primary Caregiver: Yes(Jeannette and Son) Is Caregiver In Agreement with Plan?: Yes Does Caregiver/Family have Issues with Lodging/Transportation while Pt is in Rehab?: No   Goals/Additional Needs Patient/Family Goal for Rehab: PT/OT: Supervision/Min A; SLP: NA Expected length of stay: 12-15 days Cultural Considerations: Baptist Dietary Needs: regular, thin liquids Equipment Needs: TBD Pt/Family Agrees to Admission and willing to participate: Yes Program Orientation Provided & Reviewed with Pt/Caregiver Including Roles  & Responsibilities: Yes(pt, son, and Bogosian)  Barriers to Discharge: Decreased caregiver support, Home environment access/layout, Weight bearing restrictions  Barriers to Discharge Comments: steps to enter   Decrease burden of Care through IP rehab admission: NA   Possible need for SNF placement upon discharge: not anticipated; family willing to hire assist at [place] if needed and pt has good prognosis for further progress.    Patient Condition: This patient's medical and functional status has changed since the consult dated 10/09/18 in which the Rehabilitation Physician determined and documented that the patient was potentially appropriate for intensive rehabilitative care in an inpatient rehabilitation facility. Issues have been addressed and update has been discussed with Dr. Posey Pronto and patient now appropriate for inpatient rehabilitation. Pt will have adequate caregiver support at DC as confirmed by family. Will admit to inpatient rehab today.   Preadmission Screen Completed By:  Jhonnie Garner,  10/14/2018 12:52  PM ______________________________________________________________________   Discussed status with Dr. Posey Pronto on 10/14/18 at 12:46PM and received telephone approval for admission today.  Admission Coordinator:  Jhonnie Garner, time 12:46PM/Date: 10/14/18       Revision History    Date/Time User Provider Type Action  10/14/2018 12:59 PM Alicia Arn, MD Physician Addend  10/14/2018 12:54 PM Alicia Arn, MD Physician Cosign  10/14/2018 12:53 PM Jhonnie Garner, [facility] Admission Coordinator Sign  View Details Report

## 2018-10-14 NOTE — Progress Notes (Signed)
Patient arrived and oriented to unit policies and procedures. Patient had questions about home medications. Home medications sent home with family except for Ellora (already counted and in pharmacy) and prevacid (counting and leaving with pharmacy). Reported to PA. Order placed for patient to take home meds on home regieme but unsure how these will be dispensed. Will report to night RN. In no distress, resting comfortably with call bell in place.

## 2018-10-14 NOTE — IPOC Note (Signed)
Overall Plan of Care [facility](IPOC) Patient Details Name: [name] MRN: [id number] DOB: 06/17/1946  Admitting Diagnosis: Right hip fracture s/p right hip arthroplasty  Hospital Problems: Active Problems:   Hip fracture requiring operative repair (HCC)   Elevated blood pressure reading   Hypoalbuminemia due to protein-calorie malnutrition (HCC)   Noncompliance     Functional Problem List: Nursing Bowel, Endurance, Medication Management, Motor, Pain, Safety  PT Balance, Endurance, Motor, Pain, Safety  OT Pain, Balance, Safety, Endurance  SLP    TR         Basic ADL's: OT Grooming, Bathing, Dressing, Toileting     Advanced  ADL's: OT Simple Meal Preparation, Laundry, Light Housekeeping     Transfers: PT Bed Mobility, Bed to Chair, Car, Occupational psychologisturniture  OT Toilet, Research scientist (life sciences)Tub/Shower     Locomotion: PT Ambulation, Stairs, Psychologist, prison and probation servicesWheelchair Mobility     Additional Impairments: OT Fuctional Use of Upper Extremity  SLP        TR      Anticipated Outcomes Item Anticipated Outcome  Self Feeding Indep.  Swallowing      Basic self-care  Mod I  Toileting  Mod I   Bathroom Transfers Mod I  Bowel/Bladder  remain continent and empty bowels and bladder regularly  Transfers  modI  Locomotion  modI with LRAD  Communication     Cognition     Pain  less than 4  Safety/Judgment  Remain free of falls, skin breakdown and infection    Therapy Plan: PT Intensity: Minimum of 1-2 x/day ,45 to 90 minutes PT Frequency: 5 out of 7 days PT Duration Estimated Length of Stay: 7-10 days OT Intensity: Minimum of 1-2 x/day, 45 to 90 minutes OT Frequency: 5 out of 7 days OT Duration/Estimated Length of Stay: 7-10 days      Team Interventions: Nursing Interventions Patient/Family Education, Pain Management, Medication Management, Skin Care/Wound Management  PT interventions Ambulation/gait training, Discharge planning, Functional mobility training, Psychosocial support, Therapeutic Activities,  Wheelchair propulsion/positioning, Therapeutic Exercise, Neuromuscular re-education, Disease management/prevention, Warden/rangerBalance/vestibular training, DME/adaptive equipment instruction, Pain management, Splinting/orthotics, UE/LE Strength taining/ROM, Stair training, UE/LE Coordination activities, Patient/family education, Community reintegration  OT Interventions Warden/rangerBalance/vestibular training, Discharge planning, Pain management, Self Care/advanced ADL retraining, Therapeutic Activities, UE/LE Coordination activities, Therapeutic Exercise, Patient/family education, Functional mobility training, Community reintegration, Fish farm managerDME/adaptive equipment instruction, Psychosocial support, UE/LE Strength taining/ROM  SLP Interventions    TR Interventions    SW/CM Interventions Discharge Planning, Psychosocial Support, Patient/Family Education   Barriers to Discharge MD  Medical stability  Nursing      PT Weight bearing restrictions, Mckenzie of/limited family support, Decreased caregiver support    OT      SLP      SW       Team Discharge Planning: Destination: PT-Home ,OT- Home , SLP-  Projected Follow-up: PT-Home health PT, OT-  Home health OT, SLP-  Projected Equipment Needs: PT-Rolling walker with 5" wheels, OT- 3 in 1 bedside comode, Tub/shower bench, SLP-  Equipment Details: PT- , OT-  Patient/family involved in discharge planning: PT- Patient,  OT-Patient, SLP-   MD ELOS: 5-8 days. Medical Rehab Prognosis:  Good Assessment: nn B [name] is a 72 year old female with history frequent UTIs, recent bout with C. difficile colitis who tripped and fell night onto her right hip prior to admission on 10/08/2018.  [facility].  She underwent right unipolar hip arthroplasty by Dr. Carola FrostHandy the same day.  Postop to be weightbearing as tolerated and recommendations for 30 days of Lovenox for DVT prophylaxis.  Lab work showed evidence of metabolic  bone with vitamin D levels at 19.9 .  Ortho recommends vitamin D and calcium supplements with DEXA for work-up post discharge.  She has had issues with congestion, nausea as well as ABLA.  Patient with resulting functional deficits with mobility, transfers, endurance, self-care.  We will set goals for Mod I with PT/OT.  See Team Conference Notes for weekly updates to the plan of care

## 2018-10-14 NOTE — Progress Notes (Signed)
[name]  [facility]

## 2018-10-14 NOTE — Discharge Instructions (Addendum)
[facility] Discharge Instructions   General Discharge Instructions  WEIGHT BEARING STATUS: Weightbearing as tolerated Right leg   RANGE OF MOTION/ACTIVITY: posterior hip precautions R hip, otherwise activity as tolerated  Wound Care: daily dressing changes effective immediately. See instructions below  Discharge Wound Care Instructions  Do NOT apply any ointments, solutions or lotions to pin sites or surgical wounds.  These prevent needed drainage and even though solutions like hydrogen peroxide kill bacteria, they also damage cells lining the pin sites that help fight infection.  Applying lotions or ointments can keep the wounds moist and can cause them to breakdown and open up as well. This can increase the risk for infection. When in doubt call the office.  Surgical incisions should be dressed daily.  If any drainage is noted, use one layer of adaptic, then gauze, Kerlix, and an ace wrap.  Once the incision is completely dry and without drainage, it may be left open to air out.  Showering may begin 36-48 hours later.  Cleaning gently with soap and water.  Traumatic wounds should be dressed daily as well.    One layer of adaptic, gauze, Kerlix, then ace wrap.  The adaptic can be discontinued once the draining has ceased    If you have a wet to dry dressing: wet the gauze with saline the squeeze as much saline out so the gauze is moist (not soaking wet), place moistened gauze over wound, then place a dry gauze over the moist one, followed by Kerlix wrap, then ace wrap.    Diet: as you were eating previously.  Can use over the counter stool softeners and bowel preparations, such as Miralax, to help with bowel movements.  Narcotics can be constipating.  Be sure to drink plenty of fluids  PAIN MEDICATION USE AND EXPECTATIONS  You have likely been given narcotic medications to help control your pain.  After a traumatic event that results in an fracture (broken bone)  with or without surgery, it is ok to use narcotic pain medications to help control one's pain.  We understand that everyone responds to pain differently and each individual patient will be evaluated on a regular basis for the continued need for narcotic medications. Ideally, narcotic medication use should last no more than 6-8 weeks (coinciding with fracture healing).   As a patient it is your responsibility as well to monitor narcotic medication use and report the amount and frequency you use these medications when you come to your office visit.   We would also advise that if you are using narcotic medications, you should take a dose prior to therapy to maximize you participation.  IF YOU ARE ON NARCOTIC MEDICATIONS IT IS NOT PERMISSIBLE TO OPERATE A MOTOR VEHICLE (MOTORCYCLE/CAR/TRUCK/MOPED) OR HEAVY MACHINERY DO NOT MIX NARCOTICS WITH OTHER CNS (CENTRAL NERVOUS SYSTEM) DEPRESSANTS SUCH AS ALCOHOL   STOP SMOKING OR USING NICOTINE PRODUCTS!!!!  As discussed nicotine severely impairs your body's ability to heal surgical and traumatic wounds but also impairs bone healing.  Wounds and bone heal by forming microscopic blood vessels (angiogenesis) and nicotine is a vasoconstrictor (essentially, shrinks blood vessels).  Therefore, if vasoconstriction occurs to these microscopic blood vessels they essentially disappear and are unable to deliver necessary nutrients to the healing tissue.  This is one modifiable factor that you can do to dramatically increase your chances of healing your injury.    (This means no smoking, no nicotine gum, patches, etc)  DO NOT USE NONSTEROIDAL ANTI-INFLAMMATORY DRUGS (NSAID'S)  Using products such as Advil (ibuprofen), Aleve (naproxen), Motrin (ibuprofen) for additional pain control during fracture healing can delay and/or prevent the healing response.  If you would like to take over the counter (OTC) medication, Tylenol (acetaminophen) is ok.  However, some narcotic  medications that are given for pain control contain acetaminophen as well. Therefore, you should not exceed more than 4000 mg of tylenol in a day if you do not have liver disease.  Also note that there are may OTC medicines, such as cold medicines and allergy medicines that my contain tylenol as well.  If you have any questions about medications and/or interactions please ask your doctor/PA or your pharmacist.      ICE AND ELEVATE INJURED/OPERATIVE EXTREMITY  Using ice and elevating the injured extremity above your heart can help with swelling and pain control.  Icing in a pulsatile fashion, such as 20 minutes on and 20 minutes off, can be followed.    Do not place ice directly on skin. Make sure there is a barrier between to skin and the ice pack.    Using frozen items such as frozen peas works well as the conform nicely to the are that needs to be iced.  USE AN ACE WRAP OR TED HOSE FOR SWELLING CONTROL  In addition to icing and elevation, Ace wraps or TED hose are used to help limit and resolve swelling.  It is recommended to use Ace wraps or TED hose until you are informed to stop.    When using Ace Wraps start the wrapping distally (farthest away from the body) and wrap proximally (closer to the body)   Example: If you had surgery on your leg or thing and you do not have a splint on, start the ace wrap at the toes and work your way up to the thigh        If you had surgery on your upper extremity and do not have a splint on, start the ace wrap at your fingers and work your way up to the upper arm  IF YOU ARE IN A SPLINT OR CAST DO NOT REMOVE IT FOR ANY REASON   If your splint gets wet for any reason please contact the office immediately. You may shower in your splint or cast as long as you keep it dry.  This can be done by wrapping in a cast cover or garbage back (or similar)  Do Not stick any thing down your splint or cast such as pencils, money, or hangers to try and scratch yourself with.  If  you feel itchy take benadryl as prescribed on the bottle for itching  IF YOU ARE IN A CAM BOOT (BLACK BOOT)  You may remove boot periodically. Perform daily dressing changes as noted below.  Wash the liner of the boot regularly and wear a sock when wearing the boot. It is recommended that you sleep in the boot until told otherwise  CALL THE OFFICE WITH ANY QUESTIONS OR CONCERNS: [phone]

## 2018-10-15 ENCOUNTER — Inpatient Hospital Stay (HOSPITAL_COMMUNITY): Payer: Medicare Other | Admitting: Occupational Therapy

## 2018-10-15 ENCOUNTER — Inpatient Hospital Stay (HOSPITAL_COMMUNITY): Payer: Medicare Other | Admitting: Physical Therapy

## 2018-10-15 DIAGNOSIS — Z91199 Patient's noncompliance with other medical treatment and regimen due to unspecified reason: Secondary | ICD-10-CM

## 2018-10-15 DIAGNOSIS — S72001S Fracture of unspecified part of neck of right femur, sequela: Secondary | ICD-10-CM

## 2018-10-15 DIAGNOSIS — E46 Unspecified protein-calorie malnutrition: Secondary | ICD-10-CM

## 2018-10-15 DIAGNOSIS — D696 Thrombocytopenia, unspecified: Secondary | ICD-10-CM

## 2018-10-15 DIAGNOSIS — R739 Hyperglycemia, unspecified: Secondary | ICD-10-CM

## 2018-10-15 DIAGNOSIS — Z9119 Patient's noncompliance with other medical treatment and regimen: Secondary | ICD-10-CM

## 2018-10-15 DIAGNOSIS — K5903 Drug induced constipation: Secondary | ICD-10-CM

## 2018-10-15 DIAGNOSIS — R350 Frequency of micturition: Secondary | ICD-10-CM

## 2018-10-15 DIAGNOSIS — D62 Acute posthemorrhagic anemia: Secondary | ICD-10-CM

## 2018-10-15 DIAGNOSIS — E8809 Other disorders of plasma-protein metabolism, not elsewhere classified: Secondary | ICD-10-CM

## 2018-10-15 DIAGNOSIS — R030 Elevated blood-pressure reading, without diagnosis of hypertension: Secondary | ICD-10-CM

## 2018-10-15 LAB — CBC WITH DIFFERENTIAL/PLATELET
Abs Immature Granulocytes: 0.03 10*3/uL (ref 0.00–0.07)
Basophils Absolute: 0.1 10*3/uL (ref 0.0–0.1)
Basophils Relative: 1 %
Eosinophils Absolute: 0.2 10*3/uL (ref 0.0–0.5)
Eosinophils Relative: 4 %
HEMATOCRIT: 35.5 % — AB (ref 36.0–46.0)
Hemoglobin: 11.1 g/dL — ABNORMAL LOW (ref 12.0–15.0)
Immature Granulocytes: 1 %
Lymphocytes Relative: 27 %
Lymphs Abs: 1.8 10*3/uL (ref 0.7–4.0)
MCH: 27.7 pg (ref 26.0–34.0)
MCHC: 31.3 g/dL (ref 30.0–36.0)
MCV: 88.5 fL (ref 80.0–100.0)
Monocytes Absolute: 0.8 10*3/uL (ref 0.1–1.0)
Monocytes Relative: 12 %
Neutro Abs: 3.7 10*3/uL (ref 1.7–7.7)
Neutrophils Relative %: 55 %
Platelets: 158 10*3/uL (ref 150–400)
RBC: 4.01 MIL/uL (ref 3.87–5.11)
RDW: 13.8 % (ref 11.5–15.5)
WBC: 6.5 10*3/uL (ref 4.0–10.5)
nRBC: 0 % (ref 0.0–0.2)

## 2018-10-15 LAB — COMPREHENSIVE METABOLIC PANEL
ALT: 16 U/L (ref 0–44)
AST: 13 U/L — AB (ref 15–41)
Albumin: 2.9 g/dL — ABNORMAL LOW (ref 3.5–5.0)
Alkaline Phosphatase: 71 U/L (ref 38–126)
Anion gap: 8 (ref 5–15)
BILIRUBIN TOTAL: 0.5 mg/dL (ref 0.3–1.2)
BUN: 12 mg/dL (ref 8–23)
CO2: 28 mmol/L (ref 22–32)
Calcium: 8.9 mg/dL (ref 8.9–10.3)
Chloride: 101 mmol/L (ref 98–111)
Creatinine, Ser: 0.83 mg/dL (ref 0.44–1.00)
GFR calc Af Amer: >60 mL/min (ref >60)
GFR calc non Af Amer: >60 mL/min (ref >60)
GLUCOSE: 116 mg/dL — AB (ref 70–99)
Potassium: 4.1 mmol/L (ref 3.5–5.1)
Sodium: 137 mmol/L (ref 135–145)
Total Protein: 6.2 g/dL — ABNORMAL LOW (ref 6.5–8.1)

## 2018-10-15 LAB — HEMOGLOBIN A1C
HEMOGLOBIN A1C: 5.2 % (ref 4.8–5.6)
Mean Plasma Glucose: 102.54 mg/dL

## 2018-10-15 MED ORDER — CRANBERRY 200 MG PO CAPS
200.0000 mg | ORAL_CAPSULE | Freq: Every day | ORAL | Status: DC
  Administered 2018-10-15: 200 mg via ORAL
  Filled 2018-10-15: qty 1

## 2018-10-15 MED ORDER — PRO-STAT SUGAR FREE PO LIQD
30.0000 mL | Freq: Two times a day (BID) | ORAL | Status: DC
  Administered 2018-10-15 – 2018-10-22 (×15): 30 mL via ORAL
  Filled 2018-10-15 (×15): qty 30

## 2018-10-15 MED ORDER — PHENAZOPYRIDINE HCL 100 MG PO TABS
100.0000 mg | ORAL_TABLET | Freq: Three times a day (TID) | ORAL | Status: DC
  Administered 2018-10-15 – 2018-10-22 (×22): 100 mg via ORAL
  Filled 2018-10-15 (×23): qty 1

## 2018-10-15 MED ORDER — CRANBERRY 200 MG PO CAPS: 200.0000 mg | ORAL_CAPSULE | Freq: Two times a day (BID) | ORAL | Status: DC

## 2018-10-15 MED ORDER — LANSOPRAZOLE 30 MG PO CPDR
30.0000 mg | DELAYED_RELEASE_CAPSULE | Freq: Every day | ORAL | Status: DC
  Administered 2018-10-15 – 2018-10-22 (×8): 30 mg via ORAL
  Filled 2018-10-15 (×8): qty 1

## 2018-10-15 NOTE — Progress Notes (Signed)
Patient verbalize her anxiousness and flustrations concerning possible having a UTI and not being able to get to her home medications  reassurance provided po cranberry juice x 2 provided but patient refuse po liquids and became very tearful and emotional stating" I know my body they are just going to let me die because I cant take any antibiotic if I get a bladder infection,". She informed me that she knows she has a bladder infection and we need to get her her medication. Notified CN to assist with situation and he spoke with the patient who insisted she need her medications. Pharmacy was notified for support, On call PA notified and order recieved

## 2018-10-15 NOTE — Progress Notes (Signed)
Occupational Therapy Session Note  Patient Details  Name: [name] Rho MRN: [id number] Date of Birth: 09/12/1946  Today's Date: 10/15/2018 OT Individual Time: [id number] OT Individual Time Calculation (min): 45 min    Short Term Goals: Week 1:  OT Short Term Goal 1 (Week 1): STG=LTG due to LOS  Skilled Therapeutic Interventions/Progress Updates:  Pt completed sit to stand from the bedside recliner with supervision using the RW for support.  She was able to complete functional mobility to down the hallway with min guard assist for approximately 8350' with min guard assist.  Therapist took wheelchair the rest of the way to the tub/shower room.  Once in the tub room therapist demonstrated transfer with use of the RW and tub bench.  She completed transfer with min assist.  Emphasis on maintaining THR precautions when lifting LEs into and out of the shower.  She was able to recall 3/3 THR precautions.Next had pt transfer back to the wheelchair and then back to the room.  She finished session with transfer to the bed with min guard assist and then to supine with supervision and use of the leg lifter for placing the LLE into the bed.  Bed alarm in place with call button and phone in reach.    Therapy Documentation Precautions:  Precautions Precautions: Posterior Hip Precaution Booklet Issued: Yes (comment) Precaution Comments: precautions reviewed with pt Restrictions Weight Bearing Restrictions: Yes RLE Weight Bearing: Partial weight bearing(Per pt report) RLE Partial Weight Bearing Percentage or Pounds: 50  Pain: Pain Assessment Pain Scale: Faces Pain Score: 6  Pain Type: Surgical pain Pain Location: Leg Pain Orientation: Right Pain Descriptors / Indicators: Dull;Discomfort Pain Onset: With Activity Pain Intervention(s): Repositioned  Therapy/Group: Individual Therapy  Taegan Standage OTR/L 10/15/2018, 4:09 PM

## 2018-10-15 NOTE — Plan of Care (Signed)
  Problem: RH BOWEL ELIMINATION Goal: RH STG MANAGE BOWEL WITH ASSISTANCE Description STG Manage Bowel with Assistance. Mod I  Outcome: Progressing   Problem: RH SKIN INTEGRITY Goal: RH STG ABLE TO PERFORM INCISION/WOUND CARE W/ASSISTANCE Description STG Able To Perform Incision/Wound Care With Assistance. Mod I  Outcome: Progressing   Problem: RH PAIN MANAGEMENT Goal: RH STG PAIN MANAGED AT OR BELOW PT'S PAIN GOAL Description Less than 4  Outcome: Progressing   Problem: RH KNOWLEDGE DEFICIT GENERAL Goal: RH STG INCREASE KNOWLEDGE OF SELF CARE AFTER HOSPITALIZATION Description Patient will be able to verbalize self care, incision care and safety precautions for home.  Mod I  Outcome: Progressing

## 2018-10-15 NOTE — Progress Notes (Signed)
PHARMACIST - PHYSICIAN ORDER COMMUNICATION  CONCERNING: P&T Medication Policy on Herbal Medications  DESCRIPTION:  This patient's order for:  cranberry  has been noted.  This product(s) is classified as an "herbal" or natural product. Due to a lack of definitive safety studies or FDA approval, nonstandard manufacturing practices, plus the potential risk of unknown drug-drug interactions while on inpatient medications, the Pharmacy and Therapeutics Committee does not permit the use of "herbal" or natural products of this type within [facility].   ACTION TAKEN: The pharmacy department is unable to verify this order at this time and your patient has been informed of this safety policy. Please reevaluate patient's clinical condition at discharge and address if the herbal or natural product(s) should be resumed at that time.   

## 2018-10-15 NOTE — Progress Notes (Signed)
Inpatient Rehabilitation  Patient information reviewed and entered into eRehab system by Oluwatobi Ruppe M. Yexalen Deike, M.A., CCC/SLP, PPS Coordinator.  Information including medical coding, functional ability and quality indicators will be reviewed and updated through discharge.    Per nursing patient was given "Data Collection Information Summary" for Patients in Inpatient Rehabilitation Facilities with attached "Privacy Act Statement-Health Care Records" upon admission.   

## 2018-10-15 NOTE — Progress Notes (Signed)
[place] PHYSICAL MEDICINE & REHABILITATION PROGRESS NOTE  Subjective/Complaints: Patient seen sitting up at the edge of her bed this morning.  She states she did not sleep well overnight due to urinary frequency.  She has questions about potential medications.  Discussed with PA, pt agitated overnight due to not receiving herbal medication.  ROS: + Urinary frequency.  Denies CP, shortness of breath, nausea, vomiting, diarrhea.  Objective: Vital Signs: Blood pressure (!) 150/63, pulse 65, temperature 97.6 F (36.4 C), temperature source Oral, resp. rate 16, height 5\' 4"  (1.626 m), weight 76 kg, SpO2 97 %. No results found. Recent Labs    10/15/18 0443  WBC 6.5  HGB 11.1*  HCT 35.5*  PLT 158   Recent Labs    10/15/18 0443  NA 137  K 4.1  CL 101  CO2 28  GLUCOSE 116*  BUN 12  CREATININE 0.83  CALCIUM 8.9    Physical Exam: BP (!) 150/63 (BP Location: Left Arm)   Pulse 65   Temp 97.6 F (36.4 C) (Oral)   Resp 16   Ht 5\' 4"  (1.626 m)   Wt 76 kg   SpO2 97%   BMI 28.76 kg/m  Constitutional: Well-developed.  Well-nourished. Cardiovascular: Normal rate and regular rhythm.  JVD. Respiratory: Effort normal and breath sounds normal.  GI: Bowel sounds are normal.  Nondistended. Musculoskeletal: Right hip with edema and tenderness  Neurological: She is alert and oriented.  Motor: Bilateral upper extremities: 5/5 proximal distal Left lower extremity: Hip flexion 4+/5, knee extension 4+/5, ankle dorsiflexion 5/5 Right lower extremity: Hip flexion 4/5, knee extension 4-4+/5, ankle dorsiflexion 5/5 Sensation intact light touch   Skin: Skin is warm and dry. She is not diaphoretic.  Right hip incision C/D/I with suture in place.  BLE with multiple telangiectasias.    Psychiatric: Agitated  Assessment/Plan: 1. Functional deficits secondary to right hip fracture status post hemiarthroplasty which require 3+ hours per day of interdisciplinary therapy in a comprehensive  inpatient rehab setting.  Physiatrist is providing close team supervision and 24 hour management of active medical problems listed below.  Physiatrist and rehab team continue to assess barriers to discharge/monitor patient progress toward functional and medical goals  Care Tool:  Bathing              Bathing assist       Upper Body Dressing/Undressing Upper body dressing        Upper body assist      Lower Body Dressing/Undressing Lower body dressing            Lower body assist       Toileting Toileting    Toileting assist Assist for toileting: Contact Guard/Touching assist     Transfers Chair/bed transfer  Transfers assist           Locomotion Ambulation   Ambulation assist              Walk 10 feet activity   Assist           Walk 50 feet activity   Assist           Walk 150 feet activity   Assist           Walk 10 feet on uneven surface  activity   Assist           Wheelchair     Assist               Wheelchair 50 feet with 2  turns activity    Assist            Wheelchair 150 feet activity     Assist            Medical Problem List and Plan: 1.  Deficits with mobility, transfers, endurance, self-care secondary to Right hip fracture s/p THA.  Begin CIR 2.  DVT Prophylaxis/Anticoagulation: Pharmaceutical: Lovenox 3. Pain Management: Hydrocodone prn 4. Mood: LCSW to follow for evaluation and support.  5. Neuropsych: This patient is capable of making decisions on her own behalf. 6. Skin/Wound Care: Routine pressure relief measures.  7. Fluids/Electrolytes/Nutrition: Monitor I/O.   BMP within acceptable range on 12/3 8. Metabolic bone disease: On Vitamin D and Calcium--patient has been refusing despite education.  9. ABLA : Monitor H/H with serial checks. Has been refusing MVI--will avoid iron due to ongoing nausea/ constipation issues.   Hemoglobin 11.1 on 12/3  Continue  to monitor 10. 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 Increased senna S to 2 pills at HS.   11.  Thrombocytopenia: Resolved  Plts 158 on 12/23 12.  Frequency/Urgency: UA negative--monitor for now. Recently completed 4-6 weeks oral vancomycin for C diff.    Urine culture pending  1 PVR relatively unremarkable, will await further scans prior to starting medication.  Pyridium started on 12/3 13. Low calorie malnutrition: Prealbumin levels low at 11.2.  Offer snacks between meals as refusing supplements.   14. Hyperglycemia:  Stress induced v/s steroid induced--receive Kenalog inj for URI recently.   Hgb A1c 5.2 on 12/3 15.  Elevated blood pressure  Monitor with increased mobility 16.  Hypoalbuminemia  Supplement initiated on 12/3  >35 spent with patient in total in counseling between PAs and myself regarding urinary symptoms, constipation, compliance, etc  LOS: 1 days A FACE TO FACE EVALUATION WAS PERFORMED  Ankit Karis Jubanil Patel 10/15/2018, 8:29 AM

## 2018-10-15 NOTE — Evaluation (Signed)
Physical Therapy Assessment and Plan  Patient Details  Name: [name] MRN: [id number] Date of Birth: Apr 09, 1946  PT Diagnosis: Abnormality of gait, Difficulty walking, Muscle spasms, Muscle weakness and Pain in R hip Rehab Potential: Good ELOS: 7-10 days   Today's Date: 10/15/2018 PT Individual Time: 0800-0900 PT Individual Time Calculation (min): 60 min    Problem List:  Patient Active Problem List   Diagnosis Date Noted  . Elevated blood pressure reading   . Hypoalbuminemia due to protein-calorie malnutrition ([place])   . Noncompliance   . Hip fracture requiring operative repair ([place]) 10/14/2018  . Fracture   . Hyperglycemia   . Urinary frequency   . Thrombocytopenia ([place])   . Drug induced constipation   . Acute blood loss anemia   . Metabolic bone disease   . Post-operative pain   . Generalized osteoarthritis   . Steroid-induced hyperglycemia   . Postoperative pain   . Leukocytosis   . Hip fracture requiring operative repair, right, closed, initial encounter ([place]) 10/08/2018  . Medication intolerance 10/08/2018  . Essential hypertension   . History of Clostridium difficile infection     Past Medical History:  Past Medical History:  Diagnosis Date  . Arthritis    oa  . Complication of anesthesia   . Frequent UTI   . GERD (gastroesophageal reflux disease)   . History of Clostridium difficile infection   . PONV (postoperative nausea and vomiting)    Past Surgical History:  Past Surgical History:  Procedure Laterality Date  . APPENDECTOMY    . CERVICAL FUSION     3 4 & 5   . HIP ARTHROPLASTY Right 10/08/2018   Procedure: ARTHROPLASTY BIPOLAR HIP (HEMIARTHROPLASTY);  Surgeon: Altamese [place], MD;  Location: [place];  Service: Orthopedics;  Laterality: Right;  . OVARIAN CYST REMOVAL    . SHOULDER SURGERY      Assessment & Plan Clinical Impression: [name] a 72 y.o.femalewith history of OA, frequent UTIs, C. difficile colitis--currently on ?  IVvancomycin taper who tripped and fell night prior to admission onto right hip with onset of pain extending to her knee.History taken from chart review, patient, and daughter.She was evaluated at [facility] ED and transferred to [facility] for treatment of her right femoral neck fracture.. She underwent right unipolar hip arthroplasty by Dr. Marcelino Scot on 10/08/2018. Postop to be WBAT and on Lovenox for DVT prophylaxis.Labs today shows evidence of leukocytosis. Hospital course complicated by postoperative pain.PT evaluations done revealing functional deficits. CIR recommended for follow-up therapy. Pt is to be admitted to CIR on 10/14/18. Patient transferred to CIR on 10/14/2018 .   Patient currently requires min with mobility secondary to muscle weakness, decreased cardiorespiratoy endurance and decreased standing balance, decreased postural control and decreased balance strategies.  Prior to hospitalization, patient was independent  with mobility and lived with Spouse in a House home.  Home access is 2Stairs to enter.  Patient will benefit from skilled PT intervention to maximize safe functional mobility, minimize fall risk and decrease caregiver burden for planned discharge home with 24 hour supervision.  Anticipate patient will benefit from follow up [place] at discharge.  PT - End of Session Activity Tolerance: Tolerates 30+ min activity with multiple rests Endurance Deficit: Yes Endurance Deficit Description: fatigues quickly with mobility activities, requires frequent seated rest breaks PT Assessment Rehab Potential (ACUTE/IP ONLY): Good PT Barriers to Discharge: Weight bearing restrictions;Lack of/limited family support;Decreased caregiver support PT Patient demonstrates impairments in the following area(s): Balance;Endurance;Motor;Pain;Safety PT  Transfers Functional Problem(s): Bed Mobility;Bed to Chair;Car;Furniture PT Locomotion Functional Problem(s): Ambulation;Stairs;Wheelchair  Mobility PT Plan PT Intensity: Minimum of 1-2 x/day ,45 to 90 minutes PT Frequency: 5 out of 7 days PT Duration Estimated Length of Stay: 7-10 days PT Treatment/Interventions: Ambulation/gait training;Discharge planning;Functional mobility training;Psychosocial support;Therapeutic Activities;Wheelchair propulsion/positioning;Therapeutic Exercise;Neuromuscular re-education;Disease management/prevention;Balance/vestibular training;DME/adaptive equipment instruction;Pain management;Splinting/orthotics;UE/LE Strength taining/ROM;Stair training;UE/LE Coordination activities;Patient/family education;Community reintegration PT Transfers Anticipated Outcome(s): modI PT Locomotion Anticipated Outcome(s): modI with LRAD PT Recommendation Recommendations for Other Services: Therapeutic Recreation consult Therapeutic Recreation Interventions: Outing/community reintergration Follow Up Recommendations: Home health PT Patient destination: Home Equipment Recommended: Rolling walker with 5" wheels  Skilled Therapeutic Intervention Pt received seated on EOB; c/o pain as below and RN present to administer medication. Assessed all mobility as below with min guard overall using RW; limited by pain and hip precautions LLE. Educated pt regarding rehab process, goals, estimated LOS to be determined once all evaluations completed. Remained in bed at end of session, all needs in reach.   PT Evaluation Precautions/Restrictions Restrictions Weight Bearing Restrictions: Yes RLE Weight Bearing: Weight bearing as tolerated General Chart Reviewed: Yes Response to Previous Treatment: Not applicable Family/Caregiver Present: No  Pain Pain Assessment Pain Scale: 0-10 Pain Score: 5  Pain Type: Acute pain Pain Location: Leg Pain Orientation: Right Pain Descriptors / Indicators: Aching Pain Onset: Gradual Pain Intervention(s): Medication (See eMAR) Home Living/Prior Functioning Home Living Available Help at  Discharge: Family;Available PRN/intermittently Type of Home: House Home Access: Stairs to enter [employer] of Steps: 2 Entrance Stairs-Rails: None Home Layout: One level Additional Comments: Pt's husband with dementia; family adding L rail to steps at entry  Lives With: Spouse Prior Function Level of Independence: Independent with basic ADLs;Independent with gait;Independent with homemaking with ambulation;Independent with transfers  Able to Take Stairs?: Yes Driving: Yes Vocation: Retired 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; reports they mostly stay home d/t husbands confusion in new places Vision/Perception  Perception Perception: Within Functional Limits Praxis Praxis: Intact  Cognition Overall Cognitive Status: Within Functional Limits for tasks assessed Orientation Level: Oriented X4 Memory: Appears intact Awareness: Appears intact Problem Solving: Appears intact Safety/Judgment: Appears intact Sensation Sensation Light Touch: Appears Intact Proprioception: Appears Intact Coordination Gross Motor Movements are Fluid and Coordinated: Yes Fine Motor Movements are Fluid and Coordinated: Yes Heel Shin Test: limited d/t R hip pain/strength limitations Motor  Motor Motor: Within Functional Limits Motor - Skilled Clinical Observations: limited d/t R hip pain  Mobility Bed Mobility Bed Mobility: Sit to Supine;Supine to Sit Supine to Sit: Minimal Assistance - Patient > 75% Sit to Supine: Minimal Assistance - Patient > 75%(leg lifter) Transfers Transfers: Sit to Stand;Stand to Sit;Stand Pivot Transfers Sit to Stand: Contact Guard/Touching assist Stand to Sit: Contact Guard/Touching assist Stand Pivot Transfers: Contact Guard/Touching assist Transfer (Assistive device): Rolling walker Locomotion  Gait Ambulation: Yes Gait Assistance: Contact Guard/Touching assist Gait Distance (Feet): 75 Feet Assistive device: Rolling walker Gait Gait: Yes Gait  Pattern: Impaired Gait Pattern: Step-to pattern;Poor foot clearance - right;Decreased stance time - right Gait velocity: significantly decreased Stairs / Additional Locomotion Stairs: Yes Stairs Assistance: Contact Guard/Touching assist Stair Management Technique: Two rails;Step to pattern;Forwards Number of Stairs: 8 Height of Stairs: 3 Ramp: Contact Guard/touching assist Wheelchair Mobility Wheelchair Mobility: No  Trunk/Postural Assessment  Cervical Assessment Cervical Assessment: Within Functional Limits Thoracic Assessment Thoracic Assessment: Within Functional Limits Lumbar Assessment Lumbar Assessment: Within Functional Limits Postural Control Postural Control: Within Functional Limits  Balance Balance Balance Assessed: Yes Standardized Balance Assessment Standardized  Balance Assessment: Timed Up and Go Test Timed Up and Go Test TUG: Normal TUG Normal TUG (seconds): 105(RW) Static Sitting Balance Static Sitting - Balance Support: No upper extremity supported Static Sitting - Level of Assistance: 6: Modified independent (Device/Increase time) Dynamic Sitting Balance Dynamic Sitting - Balance Support: No upper extremity supported;Feet supported Dynamic Sitting - Level of Assistance: 5: Stand by assistance Static Standing Balance Static Standing - Balance Support: During functional activity;Bilateral upper extremity supported Static Standing - Level of Assistance: 5: Stand by assistance Dynamic Standing Balance Dynamic Standing - Balance Support: Bilateral upper extremity supported;During functional activity Dynamic Standing - Level of Assistance: 5: Stand by assistance Extremity Assessment  RUE Assessment RUE Assessment: Within Functional Limits LUE Assessment LUE Assessment: Within Functional Limits RLE Assessment RLE Assessment: Exceptions to [facility] General Strength Comments: hip flexion 3+/5, knee extension 4-/5, knee flexion 4/5, ankle dorsiflexion/plantarflexion  4+/5 LLE Assessment LLE Assessment: Within Functional Limits    Refer to Care Plan for Long Term Goals  Recommendations for other services: Therapeutic Recreation  Outing/community reintegration  Discharge Criteria: Patient will be discharged from PT if patient refuses treatment 3 consecutive times without medical reason, if treatment goals not met, if there is a change in medical status, if patient makes no progress towards goals or if patient is discharged from hospital.  The above assessment, treatment plan, treatment alternatives and goals were discussed and mutually agreed upon: by patient  Corliss Skains 10/15/2018, 10:06 AM

## 2018-10-15 NOTE — Evaluation (Signed)
Occupational Therapy Assessment and Plan  Patient Details  Name: [name] MRN: [id number] Date of Birth: April 15, 1946  OT Diagnosis: acute pain, muscle weakness (generalized) and pain in joint Rehab Potential: Rehab Potential (ACUTE ONLY): Good ELOS: 7-10 days   Today's Date: 10/15/2018 OT Individual Time: 1100-1200 OT Individual Time Calculation (min): 60 min     Problem List:  Patient Active Problem List   Diagnosis Date Noted  . Elevated blood pressure reading   . Hypoalbuminemia due to protein-calorie malnutrition ([place])   . Noncompliance   . Hip fracture requiring operative repair ([place]) 10/14/2018  . Fracture   . Hyperglycemia   . Urinary frequency   . Thrombocytopenia ([place])   . Drug induced constipation   . Acute blood loss anemia   . Metabolic bone disease   . Post-operative pain   . Generalized osteoarthritis   . Steroid-induced hyperglycemia   . Postoperative pain   . Leukocytosis   . Hip fracture requiring operative repair, right, closed, initial encounter ([place]) 10/08/2018  . Medication intolerance 10/08/2018  . Essential hypertension   . History of Clostridium difficile infection     Past Medical History:  Past Medical History:  Diagnosis Date  . Arthritis    oa  . Complication of anesthesia   . Frequent UTI   . GERD (gastroesophageal reflux disease)   . History of Clostridium difficile infection   . PONV (postoperative nausea and vomiting)    Past Surgical History:  Past Surgical History:  Procedure Laterality Date  . APPENDECTOMY    . CERVICAL FUSION     3 4 & 5   . HIP ARTHROPLASTY Right 10/08/2018   Procedure: ARTHROPLASTY BIPOLAR HIP (HEMIARTHROPLASTY);  Surgeon: Altamese Fortine, MD;  Location: [place];  Service: Orthopedics;  Laterality: Right;  . OVARIAN CYST REMOVAL    . SHOULDER SURGERY      Assessment & Plan Clinical Impression: [name] is a 72 year old female with history frequent UTIs, recent bout with C. difficile colitis who  tripped and fell night onto her right hip prior to admission on 10/08/2018.  History taken from chart review and patient. She was evaluated at [facility] ED and transferred to [facility] for treatment.  She underwent right unipolar hip arthroplasty by Dr. Marcelino Scot the same day.  Postop to be weightbearing as tolerated and recommendations for 30 days of Lovenox for DVT prophylaxis.  Lab work showed evidence of metabolic bone with vitamin D levels at 19.9 .  Ortho recommends vitamin D and calcium supplements with DEXA for work-up post discharge.  She has had issues with congestion, nausea as well as ABLA.  Patient transferred to CIR on 10/14/2018 .    Patient currently requires mod with basic self-care skills secondary to muscle weakness, decreased cardiorespiratoy endurance and decreased standing balance, decreased postural control, decreased balance strategies and difficulty maintaining precautions.  Prior to hospitalization, patient could complete ADLs/IADls with independent .  Patient will benefit from skilled intervention to decrease level of assist with basic self-care skills, increase independence with basic self-care skills and increase level of independence with iADL prior to discharge home independently.  Anticipate patient will require intermittent supervision and follow up home health.  OT - End of Session Activity Tolerance: Tolerates 10 - 20 min activity with multiple rests Endurance Deficit: Yes Endurance Deficit Description: fatigues quickly with mobility activities, requires frequent seated rest breaks OT Assessment Rehab Potential (ACUTE ONLY): Good OT Patient demonstrates impairments in the following area(s):  Pain;Balance;Safety;Endurance OT Basic ADL's Functional Problem(s): Grooming;Bathing;Dressing;Toileting OT Advanced ADL's Functional Problem(s): Simple Meal Preparation;Laundry;Light Housekeeping OT Transfers Functional Problem(s): Toilet;Tub/Shower OT Additional  Impairment(s): Fuctional Use of Upper Extremity OT Plan OT Intensity: Minimum of 1-2 x/day, 45 to 90 minutes OT Frequency: 5 out of 7 days OT Duration/Estimated Length of Stay: 7-10 days OT Treatment/Interventions: Balance/vestibular training;Discharge planning;Pain management;Self Care/advanced ADL retraining;Therapeutic Activities;UE/LE Coordination activities;Therapeutic Exercise;Patient/family education;Functional mobility training;Community reintegration;DME/adaptive equipment instruction;Psychosocial support;UE/LE Strength taining/ROM OT Self Feeding Anticipated Outcome(s): Indep. OT Basic Self-Care Anticipated Outcome(s): Mod I OT Toileting Anticipated Outcome(s): Mod I OT Bathroom Transfers Anticipated Outcome(s): Mod I OT Recommendation Recommendations for Other Services: Neuropsych consult;Therapeutic Recreation consult Therapeutic Recreation Interventions: Clinical cytogeneticist;Outing/community reintergration Patient destination: Home Follow Up Recommendations: Home health OT Equipment Recommended: 3 in 1 bedside comode;Tub/shower bench   Skilled Therapeutic Intervention Session One: Pt seen for OT eval and ADL bathing/dressing session. Pt voicing 4/10 pain in hip, declining need for intervention and agreeable to tx session. Throughout session, she completed functional transfers and ambulation with min A using RW. Required cuing throughout session for adherence to hip pre-cautions during functional tasks. She was able to independently recall posterior hip pre-cautions with education provided throughout for functional implications. She bathed seated on tub bench, assist for LB due to pre-cautions. She dressed seated on toilet, max A for LB bathing/dressing and set-up for UB dressing. Following seated rest break, she ambulated to sink and completed grooming tasks standing at sink with supervision. Pt left seated in w/c at end of session, all needs in reach.  Education  provided throughout session regarding role of OT, POC, OT goals, WBing restrictions, and d/c planning.   Session Two: Pt seen for OT session focusing on functional mobility, ADL re-training and education with AE. Pt on telephone upon arrival, reason for missed 5 min. Of tx time. Pt then agreeable to tx session and requesting to use restroom. Use of leg lifter with VCs for technique/sequencing to come sitting EOB. She ambulated throughout session and completed functional transfers with supervision and use of RW. Supervision for toileting tasks. She returned to recliner, education and demonstration provided regarding use of reacher in order to doff socks and use of sock aid to don. Pt able to return demonstrate with min cuing. Will benefit from cont practice. Pt left seated in recliner at end of session, all needs in reach. Throughout session, discussed pt's OT/PT goals, personal goals, and d/c planning.   OT Evaluation Precautions/Restrictions  Precautions Precautions: Posterior Hip Precaution Booklet Issued: Yes (comment) Precaution Comments: precautions reviewed with pt Restrictions Weight Bearing Restrictions: Yes RLE Weight Bearing: Weight bearing as tolerated Home Living/Prior Functioning Home Living Family/patient expects to be discharged to:: Inpatient rehab Living Arrangements: Spouse/significant other Available Help at Discharge: Family, Available PRN/intermittently Type of Home: House Home Access: Stairs to enter [employer] of Steps: 2 Entrance Stairs-Rails: None Home Layout: One level Bathroom Shower/Tub: Chiropodist: Standard Additional Comments: Pt's husband with dementia; family adding L rail to steps at entry  Lives With: Spouse IADL History Homemaking Responsibilities: Yes Current License: Yes Mode of Transportation: Car Occupation: Retired Type of Occupation: Retired Education officer, 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; reports they mostly stay home d/t husbands confusion in new places Vision Baseline Vision/History: No  visual deficits Patient Visual Report: No change from baseline Vision Assessment?: No apparent visual deficits Saccades: Within functional limits Convergence: Within functional limits Perception  Perception: Within Functional Limits Praxis Praxis: Intact Cognition Overall Cognitive Status: Within Functional Limits for tasks assessed Arousal/Alertness: Awake/alert Orientation Level: Person;Place;Situation Person: Oriented Place: Oriented Situation: Oriented Year: 2019 Month: December Day of Week: Correct Memory: Appears intact Immediate Memory Recall: Sock;Blue;Bed Memory Recall: Sock;Blue;Bed Memory Recall Sock: Without Cue Memory Recall Blue: Without Cue Memory Recall Bed: Without Cue Awareness: Appears intact Problem Solving: Appears intact Safety/Judgment: Appears intact Sensation Sensation Light Touch: Appears Intact Proprioception: Appears Intact Coordination Gross Motor Movements are Fluid and Coordinated: No Fine Motor Movements are Fluid and Coordinated: Yes Coordination and Movement Description: Impaired by pain and WBing pre-cautions Heel Shin Test: limited d/t R hip pain/strength limitations Motor  Motor Motor: Within Functional Limits Motor - Skilled Clinical Observations: limited d/t R hip pain Mobility  Bed Mobility Bed Mobility: Sit to Supine;Supine to Sit Supine to Sit: Minimal Assistance - Patient > 75% Sit to Supine: Minimal Assistance - Patient > 75%(leg lifter) Transfers Sit to Stand: Contact Guard/Touching assist Stand to Sit: Contact Guard/Touching assist  Trunk/Postural Assessment   Cervical Assessment Cervical Assessment: Within Functional Limits Thoracic Assessment Thoracic Assessment: Within Functional Limits Lumbar Assessment Lumbar Assessment: Within Functional Limits Postural Control Postural Control: Within Functional Limits  Balance Balance Balance Assessed: Yes Standardized Balance Assessment Standardized Balance Assessment: Timed Up and Go Test Timed Up and Go Test TUG: Normal TUG Normal TUG (seconds): 105(RW) Static Sitting Balance Static Sitting - Balance Support: No upper extremity supported Static Sitting - Level of Assistance: 6: Modified independent (Device/Increase time) Dynamic Sitting Balance Dynamic Sitting - Balance Support: No upper extremity supported;Feet supported Dynamic Sitting - Level of Assistance: 6: Modified independent (Device/Increase time) Static Standing Balance Static Standing - Balance Support: During functional activity;Right upper extremity supported;Left upper extremity supported Static Standing - Level of Assistance: 5: Stand by assistance;4: Min assist Static Standing - Comment/# of Minutes: Standing at sink to complete grooming tasks Dynamic Standing Balance Dynamic Standing - Balance Support: During functional activity;Right upper extremity supported;Left upper extremity supported Dynamic Standing - Level of Assistance: 5: Stand by assistance;4: Min assist Dynamic Standing - Comments: Standing to complete LB bathing/dressing and toileting tasks Extremity/Trunk Assessment RUE Assessment RUE Assessment: Within Functional Limits LUE Assessment LUE Assessment: Within Functional Limits     Refer to Care Plan for Long Term Goals  Recommendations for other services: Neuropsych and Surveyor, mining group, Stress management and Outing/community reintegration   Discharge Criteria: Patient will be discharged from OT if patient refuses treatment 3 consecutive times without medical reason, if treatment  goals not met, if there is a change in medical status, if patient makes no progress towards goals or if patient is discharged from hospital.  The above assessment, treatment plan, treatment alternatives and goals were discussed and mutually agreed upon: by patient  Montray Kliebert L 10/15/2018, 12:47 PM

## 2018-10-16 ENCOUNTER — Inpatient Hospital Stay (HOSPITAL_COMMUNITY): Payer: Medicare Other | Admitting: Physical Therapy

## 2018-10-16 ENCOUNTER — Inpatient Hospital Stay (HOSPITAL_COMMUNITY): Payer: Medicare Other | Admitting: Occupational Therapy

## 2018-10-16 ENCOUNTER — Inpatient Hospital Stay (HOSPITAL_COMMUNITY): Payer: Medicare Other

## 2018-10-16 DIAGNOSIS — G8918 Other acute postprocedural pain: Secondary | ICD-10-CM

## 2018-10-16 LAB — URINE CULTURE: Culture: 40000 — AB

## 2018-10-16 MED ORDER — DARIFENACIN HYDROBROMIDE ER 7.5 MG PO TB24
7.5000 mg | ORAL_TABLET | Freq: Every day | ORAL | Status: DC
  Administered 2018-10-16 – 2018-10-17 (×2): 7.5 mg via ORAL
  Filled 2018-10-16 (×2): qty 1

## 2018-10-16 MED ORDER — CALCIUM CARBONATE ANTACID 500 MG PO CHEW: 400.0000 mg | CHEWABLE_TABLET | Freq: Two times a day (BID) | ORAL | Status: AC

## 2018-10-16 MED ORDER — ACETAMINOPHEN 325 MG PO TABS: 325.0000 mg | ORAL_TABLET | ORAL | Status: AC | PRN

## 2018-10-16 MED ORDER — DARIFENACIN HYDROBROMIDE ER 7.5 MG PO TB24: 7.5000 mg | ORAL_TABLET | Freq: Every day | ORAL | Status: DC

## 2018-10-16 NOTE — Progress Notes (Signed)
[facility] Individual Statement of Services  Patient Name:  [name]  Date:  10/16/2018  Welcome to the [facility].  Our goal is to provide you with an individualized program based on your diagnosis and situation, designed to meet your specific needs.  With this comprehensive rehabilitation program, you will be expected to participate in at least 3 hours of rehabilitation therapies Monday-Friday, with modified therapy programming on the weekends.  Your rehabilitation program will include the following services:  Physical Therapy (PT), Occupational Therapy (OT), 24 hour per day rehabilitation nursing, Case Management (Social Worker), Rehabilitation Medicine, Nutrition Services and Pharmacy Services  Weekly team conferences will be held on Wednesdays to discuss your progress.  Your Social Worker will talk with you frequently to get your input and to update you on team discussions.  Team conferences with you and your family in attendance may also be held.  Expected length of stay:  7 to 10 days  Overall anticipated outcome:  Independent with assistive device; supervision needed for stairs and wheelchair propulsion  Depending on your progress and recovery, your program may change. Your Social Worker will coordinate services and will keep you informed of any changes. Your Social Worker's name and contact numbers are listed  below.  The following services may also be recommended but are not provided by the [facility]:   Driving Evaluations  Home Health Rehabiltiation Services  Outpatient Rehabilitation Services  Vocational Rehabilitation   Arrangements will be made to provide these services after discharge if needed.  Arrangements include referral to agencies that provide these services.  Your insurance has been verified to be:  Medicare and [employer] Your primary doctor is:  Dr. Feliciana RossettiGreg Grisso  Pertinent  information will be shared with your doctor and your insurance company.  Social Worker:  Staci AcostaJenny Kyona Chauncey, LCSW  [phone] or [phone]) [phone]  Information discussed with and copy given to patient by: Elvera LennoxPrevatt, Jimma Ortman Capps, 10/16/2018, 9:09 AM

## 2018-10-16 NOTE — Progress Notes (Signed)
Physical Therapy Session Note  Patient Details  Name: [name] MRN: [id number] Date of Birth: 09/25/1946  Today's Date: 10/16/2018 PT Individual Time: 1445-1535 PT Individual Time Calculation (min): 50 min   Short Term Goals: Week 1:  PT Short Term Goal 1 (Week 1): =LTG due to estimated LOS  Skilled Therapeutic Interventions/Progress Updates:    Pt received seated EOB, agreeable to PT. Pt reports 4/10 pain in R hip and knee region. Ice pack to R hip at end of therapy session for pain relief. Sit to stand with Supervision to RW. Ambulation 2 x 60 ft with RW and Supervision, decreased gait speed and somewhat antalgic gait pattern due to R hip pain with WBing. Nustep level 2 x 5 min with B UE/LE for increasing tolerance to RLE ROM controlling with BUE. Assisted pt back to bed at end of therapy session. Sit to supine with CGA for RLE management. Pt left semi-reclined in bed with needs in reach and bed alarm in place.  Therapy Documentation Precautions:  Precautions Precautions: Posterior Hip Precaution Booklet Issued: Yes (comment) Precaution Comments: precautions reviewed with pt Restrictions Weight Bearing Restrictions: Yes RLE Weight Bearing: Weight bearing as tolerated RLE Partial Weight Bearing Percentage or Pounds: 50    Therapy/Group: Individual Therapy  Peter Congoaylor Hudson Majkowski, PT, DPT  10/16/2018, 3:56 PM

## 2018-10-16 NOTE — Progress Notes (Signed)
[facility] PHYSICAL MEDICINE & REHABILITATION PROGRESS NOTE  Subjective/Complaints: Patient seen working with therapy this morning.  She states she did not sleep well overnight due to urinary frequency.  She does state that she notes some improvement with Pyridium.  She does note a good first day of therapies yesterday.  ROS: + Urinary frequency.  Denies CP, shortness of breath, nausea, vomiting, diarrhea.  Objective: Vital Signs: Blood pressure (!) 155/59, pulse 68, temperature (!) 97.5 F (36.4 C), temperature source Axillary, resp. rate 16, height 5\' 4"  (1.626 m), weight 76 kg, SpO2 98 %. No results found. Recent Labs    10/15/18 0443  WBC 6.5  HGB 11.1*  HCT 35.5*  PLT 158   Recent Labs    10/15/18 0443  NA 137  K 4.1  CL 101  CO2 28  GLUCOSE 116*  BUN 12  CREATININE 0.83  CALCIUM 8.9    Physical Exam: BP (!) 155/59 (BP Location: Left Arm)   Pulse 68   Temp (!) 97.5 F (36.4 C) (Axillary)   Resp 16   Ht 5\' 4"  (1.626 m)   Wt 76 kg   SpO2 98%   BMI 28.76 kg/m  Constitutional: Well-developed.  Well-nourished. Cardiovascular: RRR.  No JVD. Respiratory: Effort normal and breath sounds normal.  GI: Bowel sounds are normal.  Nondistended. Musculoskeletal: Right hip with edema and tenderness  Neurological: She is alert and oriented.  Motor: Bilateral upper extremities: 5/5 proximal distal Left lower extremity: Hip flexion 4+/5, knee extension 4+/5, ankle dorsiflexion 5/5, stable Right lower extremity: Hip flexion 4/5, knee extension 4-4+/5, ankle dorsiflexion 5/5, stable Skin: Skin is warm and dry. She is not diaphoretic.  Right hip incision C/D/I with suture in place.  BLE with multiple telangiectasias.    Psychiatric: Flat.  Assessment/Plan: 1. Functional deficits secondary to right hip fracture status post hemiarthroplasty which require 3+ hours per day of interdisciplinary therapy in a comprehensive inpatient rehab setting.  Physiatrist is providing close  team supervision and 24 hour management of active medical problems listed below.  Physiatrist and rehab team continue to assess barriers to discharge/monitor patient progress toward functional and medical goals  Care Tool:  Bathing    Body parts bathed by patient: Right arm, Left arm, Chest, Abdomen, Front perineal area, Buttocks, Right upper leg, Left upper leg, Face   Body parts bathed by helper: Right lower leg, Left lower leg     Bathing assist Assist Level: Minimal Assistance - Patient > 75%     Upper Body Dressing/Undressing Upper body dressing   What is the patient wearing?: Pull over shirt    Upper body assist Assist Level: Set up assist    Lower Body Dressing/Undressing Lower body dressing      What is the patient wearing?: Underwear/pull up, Pants     Lower body assist Assist for lower body dressing: Maximal Assistance - Patient 25 - 49%     Toileting Toileting    Toileting assist Assist for toileting: Contact Guard/Touching assist     Transfers Chair/bed transfer  Transfers assist     Chair/bed transfer assist level: Supervision/Verbal cueing     Locomotion Ambulation   Ambulation assist      Assist level: Supervision/Verbal cueing Assistive device: Walker-rolling Max distance: 75   Walk 10 feet activity   Assist     Assist level: Supervision/Verbal cueing Assistive device: Walker-rolling   Walk 50 feet activity   Assist    Assist level: Supervision/Verbal cueing  Walk 150 feet activity   Assist Walk 150 feet activity did not occur: Safety/medical concerns         Walk 10 feet on uneven surface  activity   Assist     Assist level: Minimal Assistance - Patient > 75%     Wheelchair     Assist Will patient use wheelchair at discharge?: No             Wheelchair 50 feet with 2 turns activity    Assist            Wheelchair 150 feet activity     Assist            Medical  Problem List and Plan: 1.  Deficits with mobility, transfers, endurance, self-care secondary to Right hip fracture s/p THA.  Continue CIR 2.  DVT Prophylaxis/Anticoagulation: Pharmaceutical: Lovenox 3. Pain Management: Hydrocodone prn 4. Mood: LCSW to follow for evaluation and support.  5. Neuropsych: This patient is capable of making decisions on her own behalf. 6. Skin/Wound Care: Routine pressure relief measures.  7. Fluids/Electrolytes/Nutrition: Monitor I/O.   BMP within acceptable range on 12/3 8. Metabolic bone disease: On Vitamin D and Calcium--patient has been refusing despite education.  9. ABLA : Monitor H/H with serial checks. Has been refusing MVI--will avoid iron due to ongoing nausea/ constipation issues.   Hemoglobin 11.1 on 12/3  Continue to monitor 10. 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 Increased senna S to 2 pills at HS.   11.  Thrombocytopenia: Resolved  Plts 158 on 12/23 12.  Frequency/Urgency: UA negative--monitor for now. Recently completed 4-6 weeks oral vancomycin for C diff.    Urine culture with 40,000 and E. coli, possible contaminant.  Given recent C. difficile will attempt to avoid antibiotics at present  PVRs relatively unremarkable  Pyridium started on 12/3  Enablex started on 12/4 13. Low calorie malnutrition: Prealbumin levels low at 11.2.  Offer snacks between meals as refusing supplements.   14. Hyperglycemia:  Stress induced v/s steroid induced--receive Kenalog inj for URI recently.   Hgb A1c 5.2 on 12/3 15.  Elevated blood pressure  Monitor with increased mobility  Remains elevated today, will consider medications tomorrow if persistently elevated 16.  Hypoalbuminemia  Supplement initiated on 12/3  LOS: 2 days A FACE TO FACE EVALUATION WAS PERFORMED  Ankit Karis JubaAnil Patel 10/16/2018, 10:10 AM

## 2018-10-16 NOTE — Progress Notes (Signed)
Physical Therapy Session Note  Patient Details  Name: [name] MRN: [id number] Date of Birth: 04/23/1946  Today's Date: 10/16/2018 PT Individual Time: 5621-30861405-1435 PT Individual Time Calculation (min): 30 min   Short Term Goals: Week 1:  PT Short Term Goal 1 (Week 1): =LTG due to estimated LOS  Skilled Therapeutic Interventions/Progress Updates:   Pt resting in bed.  She was able to state 3/3 posterior hip precautions.  PT demonstrated these in standing, with RLE moving on trunk, as well as trunk moving on RLE.  Rolling <>supine with use of rail, supervision.  Strengthening in supine: 12 x 1 L straight leg raises, bil bridging; 25 x 1 alternating ankle pumps; in L side lying with doubled pillow between knees, 12 x 1 active assistive hip flexion with flexed knee, within precaution.  Instructed pt in gentle AROM R hip within precautions to avoid stiffness during extended periods of time in bed, with good carry-over.   PT instructed pt in scooting to [facility] , with bed flat, without use of rails, pushing with bil LEs     Pt left resting in bed with needs at hand and bed alarm set. Therapy Documentation Precautions:  Precautions Precautions: Posterior Hip Precaution Booklet Issued: Yes (comment) Precaution Comments: precautions reviewed with pt Restrictions Weight Bearing Restrictions: Yes RLE Weight Bearing: Weight bearing as tolerated RLE Partial Weight Bearing Percentage or Pounds: 50     Therapy/Group: Individual Therapy  Denicia Pagliarulo 10/16/2018, 2:54 PM

## 2018-10-16 NOTE — Progress Notes (Signed)
Social Work Assessment and Plan  Patient Details  Name: DAJAI WAHLERT MRN: [id number] Date of Birth: Mar 15, 1946  Today's Date: 10/15/2018  Problem List:  Patient Active Problem List   Diagnosis Date Noted  . Elevated blood pressure reading   . Hypoalbuminemia due to protein-calorie malnutrition ([place])   . Noncompliance   . Hip fracture requiring operative repair ([place]) 10/14/2018  . Fracture   . Hyperglycemia   . Urinary frequency   . Thrombocytopenia ([place])   . Drug induced constipation   . Acute blood loss anemia   . Metabolic bone disease   . Post-operative pain   . Generalized osteoarthritis   . Steroid-induced hyperglycemia   . Postoperative pain   . Leukocytosis   . Hip fracture requiring operative repair, right, closed, initial encounter ([place]) 10/08/2018  . Medication intolerance 10/08/2018  . Essential hypertension   . History of Clostridium difficile infection    Past Medical History:  Past Medical History:  Diagnosis Date  . Arthritis    oa  . Complication of anesthesia   . Frequent UTI   . GERD (gastroesophageal reflux disease)   . History of Clostridium difficile infection   . PONV (postoperative nausea and vomiting)    Past Surgical History:  Past Surgical History:  Procedure Laterality Date  . APPENDECTOMY    . CERVICAL FUSION     3 4 & 5   . HIP ARTHROPLASTY Right 10/08/2018   Procedure: ARTHROPLASTY BIPOLAR HIP (HEMIARTHROPLASTY);  Surgeon: Altamese Mendon, MD;  Location: [facility];  Service: Orthopedics;  Laterality: Right;  . OVARIAN CYST REMOVAL    . SHOULDER SURGERY     Social History:  reports that she has never smoked. She has never used smokeless tobacco. She reports that she does not drink alcohol or use drugs.  Family / Support Systems Marital Status: Married How Long?: 58 years Patient Roles: Spouse, Parent, Other (Comment)(grandmother) Spouse/Significant Other: Sindee Stucker - husband - not primary contact Children: 1 son and 1 dtr Other  Supports: Rolly Salter - sister-in-law - [phone] Anticipated Caregiver: Gillin up to 6 hours intermittently; plus son to hire caregivers PRN Ability/Limitations of Caregiver: Min A Caregiver Availability: Intermittent Family Dynamics: close, supportive family  Social History Preferred language: English Religion: Baptist Education: college Read: Yes Write: Yes 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   Abuse/Neglect Abuse/Neglect Assessment Can Be Completed: Yes Physical Abuse: Denies Verbal Abuse: Denies Sexual Abuse: Denies Exploitation of patient/patient's resources: Denies Self-Neglect: Denies  Emotional Status Pt's affect, behavior and adjustment status: Pt reports feeling overwhelmed at times, but overall is coping okay. Recent Psychosocial Issues: Pt is the caregiver for her husband who has vascular dementia.  Since she's been hospitalized, husband was admitted to [facility].  He has been there in the past for respite care.  He also has hospice services. Psychiatric History: none reported Substance Abuse History: none reported  Patient / Family Perceptions, Expectations & Goals Pt/Family understanding of illness & functional limitations: Pt has a good understanding of her condition and limitations. Premorbid pt/family roles/activities: Pt has been caring for her husband over the last 8 years.  She's not had a lot of time for hobbies.  They would go out for dinner sometimes, but recently would go for take out and eat at home instead. Anticipated changes in roles/activities/participation:  Pt would like to resume her activities as she is able.  She may allow her husband to stay at [facility] for a while, as things were getting difficult  at home already. Pt/family expectations/goals: Pt wants to regain her independence and return home.  Community Resources [employer]: None Premorbid Home Care/DME Agencies: None Transportation available at discharge: family  Resource referrals recommended: Neuropsychology  Discharge Planning Living Arrangements: Spouse/significant other Support Systems: Spouse/significant other, Children, Other relatives, Water engineer, Social worker community Type of Residence: Private residence Insurance Resources: [employer], Multimedia programmer (specify)([employer]) Museum/gallery curator Resources: [employer] Financial Screen Referred: No Money Management: Patient Does the patient have any problems obtaining your medications?: No Home Management: Pt was taking care of this PTA.  Pt will have family support or paid caregiver assistance with this, as needed. Patient/Family Preliminary Plans: Pt plans to return to her home with her family and paid caregivers to assist, although most of pt's goals are mod I. Social Work Anticipated Follow Up Needs: HH/OP Expected length of stay: 7 to 10 days  Clinical Impression CSW met with pt to introduce self and role of CSW, as well as to complete assessment.  Pt is pleased to be on CIR to rehabilitate, but had a bad night sleep her first night here.  She's hoping her medications are straightened out now and will have better nights going forward.  Pt has some support from her family, but they will hire caregivers for pt at home, as needed.  Pt's goals are mainly mod I, so pt may not need much assist at home.  Pt is overwhelmed with caregiving duties for her husband and is even coordinating things for him from CIR.  He is in assisted living right now for respite care and she may let him stay there for a while as she recovers.  No current concerns/needs/questions at this time, however pt/family will be looking forward to hearing d/c date after conference.  CSW to share that  information as soon as it is available so that pt/family can plan.  CSW will continue to follow and assist as needed.  Rahmir Beever, Silvestre Mesi 10/16/2018, 10:39 AM

## 2018-10-16 NOTE — Plan of Care (Signed)
  Problem: RH BOWEL ELIMINATION Goal: RH STG MANAGE BOWEL WITH ASSISTANCE Description STG Manage Bowel with Assistance. Mod I  Outcome: Progressing   Problem: RH SKIN INTEGRITY Goal: RH STG ABLE TO PERFORM INCISION/WOUND CARE W/ASSISTANCE Description STG Able To Perform Incision/Wound Care With Assistance. Mod I  Outcome: Progressing   Problem: RH PAIN MANAGEMENT Goal: RH STG PAIN MANAGED AT OR BELOW PT'S PAIN GOAL Description Less than 4  Outcome: Progressing{ Pain 3/10 Problem: RH KNOWLEDGE DEFICIT GENERAL Goal: RH STG INCREASE KNOWLEDGE OF SELF CARE AFTER HOSPITALIZATION Description Patient will be able to verbalize self care, incision care and safety precautions for home.  Mod I  Outcome: Progressing  Educated pt on medications

## 2018-10-16 NOTE — Progress Notes (Signed)
Physical Therapy Session Note  Patient Details  Name: [name] MRN: [id number] Date of Birth: 04/30/1946  Today's Date: 10/16/2018 PT Individual Time: 0800-0900 PT Individual Time Calculation (min): 60 min   Short Term Goals: Week 1:  PT Short Term Goal 1 (Week 1): =LTG due to estimated LOS  Skilled Therapeutic Interventions/Progress Updates: Pt received supine in bed, c/o pain as below and pre-medicated, agreeable to treatment. Supine>sit with S, HOB slightly elevated, leg lifter and bedrails. Gait x75' with RW and close S, slow speed and step-to pattern. Stand pivot w/c <>flat bed in ADL apartment with S. Min guard sit <>supine on flat bed with leg lifter; pt used bedside table to assist with pulling to sitting, min cues for maintaining neutral/externally rotated hip during supine>sit. D[facility] bedrails, [facility] and discussed possible options at DME store; pt to discuss with family to find something that will work with home setup. Stairs ascent/descent 5 steps 3" height with two handrails progressed to BUE on one rail. One trial at 6" steps x4 steps with B handrails on ascent, sideways descent with one rail and min guard/minA overall. Sit <>supine on mat table with leg lifter and min guard.   Pt c/o knee pain during gait/stair training. Therapist assessed ligamentous stability; WNL throughout. Point tenderness medial knee at adductor insertion. Educated pt regarding likelihood of muscle guarding as a reaction to trauma/surgery, benefits of PROM and AAROM to help muscles relax/strengthen. Supine AAROM hip abduction/adduction with maxislide 2x10 reps; educated pt on performance of isometrics independently in room for continued strengthening, progressing to AROM as able. 2 sets 10 reps quad sets, short arc quads. Returned to room totalA via w/c for energy conservation. Returned to bed S stand pivot, modI sit >supine with bedrails, HOB elevated. Provided hotpack to R knee;  educated pt on heat tolerance and to remove if too hot. Remained in bed, alarm intact and all needs in reach.      Therapy Documentation Precautions:  Precautions Precautions: Posterior Hip Precaution Booklet Issued: Yes (comment) Precaution Comments: precautions reviewed with pt Restrictions Weight Bearing Restrictions: Yes RLE Weight Bearing: Weight bearing as tolerated RLE Partial Weight Bearing Percentage or Pounds: 50 Pain: Pain Assessment Pain Scale: 0-10 Pain Score: 6  Pain Type: Surgical pain Pain Location: hip Pain Orientation: Right    Therapy/Group: Individual Therapy  Harlon Dittylizabeth J Taralee Marcus 10/16/2018, 9:14 AM

## 2018-10-16 NOTE — Progress Notes (Signed)
Occupational Therapy Session Note  Patient Details  Name: [name] MRN: [id number] Date of Birth: 08/02/1946  Today's Date: 10/16/2018 OT Individual Time: 1000-1100 OT Individual Time Calculation (min): 60 min    Short Term Goals: Week 1:  OT Short Term Goal 1 (Week 1): STG=LTG due to LOS  Skilled Therapeutic Interventions/Progress Updates:    Pt seen for OT ADL bathing/dressing session. Pt in supine upon arrival, agreeable to tx session. Reported unrated discomfort in R knee, heat applied upon arrival and pt reporting having been pre-medicated and agreeable to tx session. Throughout session, she completed functional ambulation and transfers with close supervision using RW. She bathed seated on tub bench with assist to wash LE 2/2 hip pre-cautions and limited ROM. Steadying assist for standing portion to wash buttock. Education and demonstration provided regarding use of reacher during LB dressing task, pt able to return demonstrate and thread B LEs into underwear/pants and stood to pull pants up with supervision. She was able to recall education for use of sock aid and donned B socks with supervision. Education provided regarding sock aid, elastic shoelaces, shoe funnel and long handled shoe horn. Will cont to practice in future sessions. Pt returned to w/c, left set-up at sink completed grooming tasks, all needs in reach.   Therapy Documentation Precautions:  Precautions Precautions: Posterior Hip Precaution Booklet Issued: Yes (comment) Precaution Comments: precautions reviewed with pt Restrictions Weight Bearing Restrictions: Yes RLE Weight Bearing: Weight bearing as tolerated Pain: Pain Assessment Pain Scale: 0-10 Pain Score: 6  Pain Type: Surgical pain Pain Location: Knee Pain Orientation: Right   Therapy/Group: Individual Therapy  Ashanti Littles L 10/16/2018, 7:11 AM

## 2018-10-16 NOTE — Plan of Care (Signed)
  Problem: RH BOWEL ELIMINATION Goal: RH STG MANAGE BOWEL WITH ASSISTANCE Description STG Manage Bowel with Assistance. Mod I  Outcome: Progressing   Problem: RH SKIN INTEGRITY Goal: RH STG ABLE TO PERFORM INCISION/WOUND CARE W/ASSISTANCE Description STG Able To Perform Incision/Wound Care With Assistance. Mod I  Outcome: Progressing   Problem: RH PAIN MANAGEMENT Goal: RH STG PAIN MANAGED AT OR BELOW PT'S PAIN GOAL Description Less than 4  Outcome: Progressing   Problem: RH KNOWLEDGE DEFICIT GENERAL Goal: RH STG INCREASE KNOWLEDGE OF SELF CARE AFTER HOSPITALIZATION Description Patient will be able to verbalize self care, incision care and safety precautions for home.  Mod I  Outcome: Progressing   

## 2018-10-17 ENCOUNTER — Inpatient Hospital Stay (HOSPITAL_COMMUNITY): Payer: Medicare Other | Admitting: Physical Therapy

## 2018-10-17 ENCOUNTER — Inpatient Hospital Stay (HOSPITAL_COMMUNITY): Payer: Medicare Other | Admitting: Occupational Therapy

## 2018-10-17 MED ORDER — LISINOPRIL 5 MG PO TABS
2.5000 mg | ORAL_TABLET | Freq: Every day | ORAL | Status: DC
  Filled 2018-10-17 (×4): qty 1

## 2018-10-17 MED ORDER — FLAVOXATE HCL 100 MG PO TABS
100.0000 mg | ORAL_TABLET | Freq: Three times a day (TID) | ORAL | Status: DC | PRN
  Filled 2018-10-17: qty 1

## 2018-10-17 NOTE — Plan of Care (Signed)
  Problem: RH KNOWLEDGE DEFICIT GENERAL Goal: RH STG INCREASE KNOWLEDGE OF SELF CARE AFTER HOSPITALIZATION Description Patient will be able to verbalize self care, incision care and safety precautions for home.  Mod I  Outcome: Progressing Continual education on medication

## 2018-10-17 NOTE — Progress Notes (Signed)
Patient refused her lisinopril claims she had headaches before but cannot recall the blood pressure med ; she said she will ask her daughter.

## 2018-10-17 NOTE — Progress Notes (Signed)
Occupational Therapy Session Note  Patient Details  Name: [name] MRN: [id number] Date of Birth: 12/02/1945  Today's Date: 10/17/2018 OT Individual Time: [id number] OT Individual Time Calculation (min): 60 min    Short Term Goals: Week 1:  OT Short Term Goal 1 (Week 1): STG=LTG due to LOS  Skilled Therapeutic Interventions/Progress Updates:    Pt seen for OT session focusing on education with AE and addressing of IADL goals. Pt in supine upon arrival, agreeable to tx session and denying pain. She requested to waith for bathing/dressing until PM session.  She sat EOB and education/demonstration provided regarding use of elastic shoe laces and LH shoe horn. She was able to successfully return demonstrate functional use of each item and able to don B shoes with supervision/VCs. She then ambulated throughout room using RW with supervision to gather dirty clothing items. Completed laundry task at unit laundry facility. [facility] for adherence to hip pre-cautions during functional task as well as education/problem solving for modified methods and easier accessiblity for IADL tasks. Pt able to ambulate entirty of distance back to room at end of session with RW and supervision, VCs for rhythmic step pattern and to increase rate of speed and UE support on RW. Pt left seated in recliner at end of session, all needs in reach .  Therapy Documentation Precautions:  Precautions Precautions: Posterior Hip Precaution Booklet Issued: Yes (comment) Precaution Comments: precautions reviewed with pt Restrictions Weight Bearing Restrictions: Yes RLE Weight Bearing: Weight bearing as tolerated   Therapy/Group: Individual Therapy  Chereese Cilento L 10/17/2018, 7:08 AM

## 2018-10-17 NOTE — Progress Notes (Addendum)
Physical Therapy Session Note  Patient Details  Name: [name] MRN: [id number] Date of Birth: 03/11/1946  Today's Date: 10/17/2018 PT Individual Time: 1130-1200 PT Individual Time Calculation (min): 30 min   Short Term Goals: Week 1:  PT Short Term Goal 1 (Week 1): =LTG due to estimated LOS  Skilled Therapeutic Interventions/Progress Updates:    Patient received in bed, very pleasant and willing to participate with therapy. Able to complete supine to sit with S and leg lifter, and required S for functional transfers and gait approximately 76f with RW. Verbally reviewed hip precautions, patient able to state 3/3 correctly. Able to ambulate approximately 104ftoday with RW but continues to have 6/10 pain in her R LE with gait. Otherwise worked on balance strategies on [employer] within safe  Hip precautions on foam pad with min guard for safety and no UE support. [facility] with chair alarm active, RN present, all needs otherwise met.   Therapy Documentation Precautions:  Precautions Precautions: Posterior Hip Precaution Booklet Issued: Yes (comment) Precaution Comments: precautions reviewed with pt Restrictions Weight Bearing Restrictions: Yes RLE Weight Bearing: Weight bearing as tolerated RLE Partial Weight Bearing Percentage or Pounds: 50 General:   Vital Signs:  Pain: Pain Assessment Pain Scale: 0-10 Pain Score: 6  Pain Type: Surgical pain Pain Location: Hip Pain Orientation: Right Pain Descriptors / Indicators: Aching;Spasm;Sharp Pain Frequency: Other (Comment) Pain Onset: On-going Patients Stated Pain Goal: 0 Pain Intervention(s): Repositioned;Ambulation/increased activity Multiple Pain Sites: No    Therapy/Group: Individual Therapy  KrDeniece ReeT, DPT, CBIS  Supplemental Physical Therapist [facility]  Pager [phone] [phone] 10/17/2018, 12:38 PM

## 2018-10-17 NOTE — Progress Notes (Signed)
Occupational Therapy Session Note  Patient Details  Name: [name] MRN: [id number] Date of Birth: 03/08/1946  Today's Date: 10/17/2018 OT Individual Time: 1400-1500 OT Individual Time Calculation (min): 60 min    Short Term Goals: Week 1:  OT Short Term Goal 1 (Week 1): STG=LTG due to LOS  Skilled Therapeutic Interventions/Progress Updates:    Patient in bed upon arrival and ready for therapy session. Functional transfers - to/from toilet, bed, w/c CS with RW, reviewed appropriate height for seats in home environment.    bed mobility min A for R LE Functional ambulation and basic HM - ambulation on unit and in patient room with CS, reviewed and practiced reach and transport of items in the kitchen area with CS and good verbal and demonstration of techniques learned.  Reviewed safe strategies for cooking (oven & microwave use) and dish washing.  Patient able to verbalize a safe plan post discharge for family assistance.   ADL - toileting/CM CS,  Patient demonstrates good awareness of THPs, thoughtful questions for transition to home environment and safety with functional tasks.  She returned to bed with alarm set and states that she is happy with her progress today.  Therapy Documentation Precautions:  Precautions Precautions: Posterior Hip Precaution Booklet Issued: Yes (comment) Precaution Comments: precautions reviewed with pt Restrictions Weight Bearing Restrictions: Yes RLE Weight Bearing: Weight bearing as tolerated RLE Partial Weight Bearing Percentage or Pounds: 50 General:   Vital Signs: Therapy Vitals Temp: 98.3 F (36.8 C) Temp Source: Oral Pulse Rate: 94 Resp: 16 BP: 139/60 Patient Position (if appropriate): Lying Oxygen Therapy SpO2: 98 % O2 Device: Room Air Pain: Pain Assessment Pain Scale: 0-10 Pain Score: 2  Pain Type: Surgical pain Pain Location: Hip Pain Orientation: Right Pain Descriptors / Indicators: Aching;Spasm;Sharp Pain Frequency: Other  (Comment) Pain Onset: On-going Patients Stated Pain Goal: 0 Pain Intervention(s): Repositioned;Environmental changes;Ambulation/increased activity Multiple Pain Sites: No   Therapy/Group: Individual Therapy  Barrie LymeStacey A Keilah Lemire 10/17/2018, 3:47 PM

## 2018-10-17 NOTE — Progress Notes (Signed)
[place] PHYSICAL MEDICINE & REHABILITATION PROGRESS NOTE  Subjective/Complaints: Patient seen sitting up at the edge of her bed this morning.  She states she slept better overnight but still had some frequency.  She states she is sore from therapies yesterday.  She notes improvement in urinary frequency.  ROS: + Urinary frequency.  Denies CP, shortness of breath, nausea, vomiting, diarrhea.  Objective: Vital Signs: Blood pressure (!) 165/74, pulse 84, temperature 97.6 F (36.4 C), temperature source Oral, resp. rate 18, height 5\' 4"  (1.626 m), weight 76 kg, SpO2 98 %. No results found. Recent Labs    10/15/18 0443  WBC 6.5  HGB 11.1*  HCT 35.5*  PLT 158   Recent Labs    10/15/18 0443  NA 137  K 4.1  CL 101  CO2 28  GLUCOSE 116*  BUN 12  CREATININE 0.83  CALCIUM 8.9    Physical Exam: BP (!) 165/74 (BP Location: Right Arm)   Pulse 84   Temp 97.6 F (36.4 C) (Oral)   Resp 18   Ht 5\' 4"  (1.626 m)   Wt 76 kg   SpO2 98%   BMI 28.76 kg/m  Constitutional: Well-developed.  Well-nourished. HENT: Normocephalic.  Atraumatic. Eyes: EOMI.  No discharge. Cardiovascular: RRR.  No JVD. Respiratory: Effort normal and breath sounds normal.  GI: Bowel sounds are normal.  Nondistended. Musculoskeletal: Right hip with edema and tenderness  Neurological: She is alert and oriented.  Motor: Bilateral upper extremities: 5/5 proximal distal Left lower extremity: Hip flexion 4+/5, knee extension 4+/5, ankle dorsiflexion 5/5, stable Right lower extremity: Hip flexion 4-/5, knee extension 4-4+/5, ankle dorsiflexion 5/5, stable Skin: Skin is warm and dry. She is not diaphoretic.  Right hip incision with sutures C/D/I.  BLE with multiple telangiectasias.    Psychiatric: Flat.  Assessment/Plan: 1. Functional deficits secondary to right hip fracture status post hemiarthroplasty which require 3+ hours per day of interdisciplinary therapy in a comprehensive inpatient rehab  setting.  Physiatrist is providing close team supervision and 24 hour management of active medical problems listed below.  Physiatrist and rehab team continue to assess barriers to discharge/monitor patient progress toward functional and medical goals  Care Tool:  Bathing    Body parts bathed by patient: Right arm, Left arm, Chest, Abdomen, Front perineal area, Buttocks, Right upper leg, Left upper leg, Face   Body parts bathed by helper: Right lower leg, Left lower leg     Bathing assist Assist Level: Minimal Assistance - Patient > 75%     Upper Body Dressing/Undressing Upper body dressing   What is the patient wearing?: Pull over shirt    Upper body assist Assist Level: Set up assist    Lower Body Dressing/Undressing Lower body dressing      What is the patient wearing?: Underwear/pull up, Pants     Lower body assist Assist for lower body dressing: Supervision/Verbal cueing     Toileting Toileting    Toileting assist Assist for toileting: Contact Guard/Touching assist     Transfers Chair/bed transfer  Transfers assist     Chair/bed transfer assist level: Supervision/Verbal cueing     Locomotion Ambulation   Ambulation assist      Assist level: Supervision/Verbal cueing Assistive device: Walker-rolling Max distance: 60'   Walk 10 feet activity   Assist     Assist level: Supervision/Verbal cueing Assistive device: Walker-rolling   Walk 50 feet activity   Assist    Assist level: Supervision/Verbal cueing Assistive device: Walker-rolling  Walk 150 feet activity   Assist Walk 150 feet activity did not occur: Safety/medical concerns         Walk 10 feet on uneven surface  activity   Assist     Assist level: Minimal Assistance - Patient > 75%     Wheelchair     Assist Will patient use wheelchair at discharge?: No             Wheelchair 50 feet with 2 turns activity    Assist            Wheelchair  150 feet activity     Assist            Medical Problem List and Plan: 1.  Deficits with mobility, transfers, endurance, self-care secondary to Right hip fracture s/p THA on 10/08/2018.  Continue CIR  Plan to DC sutures tomorrow 2.  DVT Prophylaxis/Anticoagulation: Pharmaceutical: Lovenox 3. Pain Management: Hydrocodone prn 4. Mood: LCSW to follow for evaluation and support.  5. Neuropsych: This patient is capable of making decisions on her own behalf. 6. Skin/Wound Care: Routine pressure relief measures.  7. Fluids/Electrolytes/Nutrition: Monitor I/O.   BMP within acceptable range on 12/3 8. Metabolic bone disease: On Vitamin D and Calcium--patient has been refusing despite education.  9. ABLA : Monitor H/H with serial checks. Has been refusing MVI--will avoid iron due to ongoing nausea/ constipation issues.   Hemoglobin 11.1 on 12/3  Continue to monitor 10. 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 Increased senna S to 2 pills at HS.   11.  Thrombocytopenia: Resolved  Plts 158 on 12/23 12.  Frequency/Urgency: UA negative--monitor for now. Recently completed 4-6 weeks oral vancomycin for C diff.    Urine culture with 40,000 and E. coli, likely contaminant given no other symptoms of infection.  Given recent C. difficile will attempt to avoid antibiotics at present  PVRs relatively unremarkable  Pyridium started on 12/3  Enablex started on 12/4 13. Low calorie malnutrition: Prealbumin levels low at 11.2.  Offer snacks between meals as refusing supplements.   14. Hyperglycemia:  Stress induced v/s steroid induced--receive Kenalog inj for URI recently.   Hgb A1c 5.2 on 12/3 15.  Elevated blood pressure  Monitor with increased mobility  Lisinopril 2.5 started on 12/5 16.  Hypoalbuminemia  Supplement initiated on 12/3  LOS: 3 days A FACE TO FACE EVALUATION WAS PERFORMED  Jaimeson Gopal Karis Jubanil Cerria Randhawa 10/17/2018, 9:13 AM

## 2018-10-17 NOTE — Progress Notes (Signed)
Physical Therapy Session Note  Patient Details  Name: [name] MRN: [id number] Date of Birth: 03/08/1946  Today's Date: 10/17/2018 PT Individual Time: 1000-1100 PT Individual Time Calculation (min): 60 min   Short Term Goals: Week 1:  PT Short Term Goal 1 (Week 1): =LTG due to estimated LOS  Skilled Therapeutic Interventions/Progress Updates: Pt received seated in recliner, pain as below, pre-medicated and agreeable to treatment. Pt on the phone with her daugther; discussed bedrail options with pt/daughter. Ambulatory transfer with RW and S recliner>w/c. W/c propulsion x200' with BUE and S for strengthening and aerobic endurance. Pt moved clothing from washer>dryer with reacher and S, increased time, min cues for operating dryer. Gait 2x100' with RW and S, slow speed. Ascent/descent 4 steps 6" height with bilateral handrails on ascent, one handrail sidestepping on descent, min guard overall. Discussed equipment needs; communicated to CSW that pt will need transport chair at d/c, plans to get RW from family/friends. Standing heel raises, R hip extension, R hip abduction, hamstring curls each x10 reps with BUE support on RW.  Ambulatory transfer in/out of bathroom with RW and S. Clothing and hygiene performed modI. Sit >supine with S. Applied ice to R hip, heat to R knee. Remained in bed, alarm intact and all needs in reach.      Therapy Documentation Precautions:  Precautions Precautions: Posterior Hip Precaution Booklet Issued: Yes (comment) Precaution Comments: precautions reviewed with pt Restrictions Weight Bearing Restrictions: Yes RLE Weight Bearing: Weight bearing as tolerated RLE Partial Weight Bearing Percentage or Pounds: 50 Pain: Pain Assessment Pain Score: 6  Pain Type: Surgical pain Pain Location: Hip Pain Orientation: Right Pain Descriptors / Indicators: Aching;Sharp Pain Onset: With Activity Patients Stated Pain Goal: 0 Pain Intervention(s): Medication (See  eMAR) Multiple Pain Sites: No    Therapy/Group: Individual Therapy  [name] 10/17/2018, 11:02 AM

## 2018-10-18 ENCOUNTER — Inpatient Hospital Stay (HOSPITAL_COMMUNITY): Payer: Medicare Other

## 2018-10-18 ENCOUNTER — Inpatient Hospital Stay (HOSPITAL_COMMUNITY): Payer: Medicare Other | Admitting: Physical Therapy

## 2018-10-18 ENCOUNTER — Inpatient Hospital Stay (HOSPITAL_COMMUNITY): Payer: Medicare Other | Admitting: Occupational Therapy

## 2018-10-18 MED ORDER — DARIFENACIN HYDROBROMIDE ER 7.5 MG PO TB24
7.5000 mg | ORAL_TABLET | Freq: Every day | ORAL | Status: DC
  Administered 2018-10-18: 7.5 mg via ORAL
  Filled 2018-10-18: qty 1

## 2018-10-18 MED ORDER — DICLOFENAC SODIUM 1 % TD GEL
2.0000 g | Freq: Four times a day (QID) | TRANSDERMAL | Status: DC
  Administered 2018-10-18 – 2018-10-22 (×18): 2 g via TOPICAL
  Filled 2018-10-18 (×2): qty 100

## 2018-10-18 NOTE — Progress Notes (Signed)
[name] PHYSICAL MEDICINE & REHABILITATION PROGRESS NOTE  Subjective/Complaints: Patient seen working with therapy this morning.  She states she slept well after 3 AM at which point, she states medications for urinary frequency started working.  ROS: + Urinary frequency.  Denies CP, shortness of breath, nausea, vomiting, diarrhea, dysuria, foul odor from urine.  Objective: Vital Signs: Blood pressure (!) 146/48, pulse 78, temperature 98.7 F (37.1 C), temperature source Oral, resp. rate 18, height 5\' 4"  (1.626 m), weight 76 kg, SpO2 100 %. No results found. No results for input(s): WBC, HGB, HCT, PLT in the last 72 hours. No results for input(s): NA, K, CL, CO2, GLUCOSE, BUN, CREATININE, CALCIUM in the last 72 hours.  Physical Exam: BP (!) 146/48 (BP Location: Left Arm)   Pulse 78   Temp 98.7 F (37.1 C) (Oral)   Resp 18   Ht 5\' 4"  (1.626 m)   Wt 76 kg   SpO2 100%   BMI 28.76 kg/m  Constitutional: Well-developed.  Well-nourished. HENT: Normocephalic.  Atraumatic. Eyes: EOMI.  No discharge. Cardiovascular: RRR.  No JVD. Respiratory: Effort normal and breath sounds normal.  GI: Bowel sounds are normal.  Nondistended. Musculoskeletal: Right hip with edema and tenderness, improving  Neurological: She is alert and oriented.  Motor: Bilateral upper extremities: 5/5 proximal distal Left lower extremity: Hip flexion 4+/5, knee extension 4+/5, ankle dorsiflexion 5/5, unchanged Right lower extremity: Hip flexion 4-/5, knee extension 4-4+/5, ankle dorsiflexion 5/5, unchanged Skin: Skin is warm and dry. She is not diaphoretic.  Right hip incision with sutures C/D/I.  BLE with multiple telangiectasias.    Psychiatric: Flat.  Assessment/Plan: 1. Functional deficits secondary to right hip fracture status post hemiarthroplasty which require 3+ hours per day of interdisciplinary therapy in a comprehensive inpatient rehab setting.  Physiatrist is providing close team supervision and 24  hour management of active medical problems listed below.  Physiatrist and rehab team continue to assess barriers to discharge/monitor patient progress toward functional and medical goals  Care Tool:  Bathing    Body parts bathed by patient: Right arm, Left arm, Chest, Abdomen, Front perineal area, Buttocks, Right upper leg, Left upper leg, Face, Right lower leg, Left lower leg   Body parts bathed by helper: Right lower leg, Left lower leg     Bathing assist Assist Level: Supervision/Verbal cueing     Upper Body Dressing/Undressing Upper body dressing   What is the patient wearing?: Pull over shirt    Upper body assist Assist Level: Independent    Lower Body Dressing/Undressing Lower body dressing      What is the patient wearing?: Underwear/pull up, Pants     Lower body assist Assist for lower body dressing: Supervision/Verbal cueing     Toileting Toileting    Toileting assist Assist for toileting: Supervision/Verbal cueing     Transfers Chair/bed transfer  Transfers assist     Chair/bed transfer assist level: Supervision/Verbal cueing     Locomotion Ambulation   Ambulation assist      Assist level: Supervision/Verbal cueing Assistive device: Walker-rolling Max distance: 100   Walk 10 feet activity   Assist     Assist level: Supervision/Verbal cueing Assistive device: Walker-rolling   Walk 50 feet activity   Assist    Assist level: Supervision/Verbal cueing Assistive device: Walker-rolling    Walk 150 feet activity   Assist Walk 150 feet activity did not occur: Safety/medical concerns         Walk 10 feet on uneven surface  activity   Assist     Assist level: Minimal Assistance - Patient > 75%     Wheelchair     Assist Will patient use wheelchair at discharge?: No             Wheelchair 50 feet with 2 turns activity    Assist            Wheelchair 150 feet activity     Assist             Medical Problem List and Plan: 1.  Deficits with mobility, transfers, endurance, self-care secondary to Right hip fracture s/p THA on 10/08/2018.  Continue CIR  DC sutures 2.  DVT Prophylaxis/Anticoagulation: Pharmaceutical: Lovenox 3. Pain Management: Hydrocodone prn 4. Mood: LCSW to follow for evaluation and support.  5. Neuropsych: This patient is capable of making decisions on her own behalf. 6. Skin/Wound Care: Routine pressure relief measures.  7. Fluids/Electrolytes/Nutrition: Monitor I/O.   BMP within acceptable range on 12/3 8. Metabolic bone disease: On Vitamin D and Calcium--patient has been refusing despite education.  9. ABLA : Monitor H/H with serial checks. Has been refusing MVI--will avoid iron due to ongoing nausea/ constipation issues.   Hemoglobin 11.1 on 12/3  Labs ordered for Monday  Continue to monitor 10. 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 Increased senna S to 2 pills at HS.   11.  Thrombocytopenia: Resolved  Plts 158 on 12/23  Labs ordered for Monday 12.  Frequency/Urgency: UA negative--monitor for now. Recently completed 4-6 weeks oral vancomycin for C diff.    Urine culture with 40,000 and E. coli, likely contaminant given no other symptoms of infection.  Given recent C. difficile will attempt to avoid antibiotics at present  PVRs relatively unremarkable  Pyridium started on 12/3  Enablex started on 12/4, timing changed on 12/6 13. Low calorie malnutrition: Prealbumin levels low at 11.2.  Offer snacks between meals as refusing supplements.   14. Hyperglycemia:  Stress induced v/s steroid induced--receive Kenalog inj for URI recently.   Hgb A1c 5.2 on 12/3 15.  Elevated blood pressure  Monitor with increased mobility  Lisinopril 2.5 started on 12/5  Improving on 12/6 16.  Hypoalbuminemia  Supplement initiated on 12/3  LOS: 4 days A FACE TO FACE EVALUATION WAS PERFORMED  [name] [name] 10/18/2018, 9:24 AM

## 2018-10-18 NOTE — Plan of Care (Signed)
  Problem: RH SKIN INTEGRITY Goal: RH STG ABLE TO PERFORM INCISION/WOUND CARE W/ASSISTANCE Description STG Able To Perform Incision/Wound Care With Assistance. Mod I  Outcome: Progressing   Problem: RH PAIN MANAGEMENT Goal: RH STG PAIN MANAGED AT OR BELOW PT'S PAIN GOAL Description Less than 4  Outcome: Progressing   Problem: RH KNOWLEDGE DEFICIT GENERAL Goal: RH STG INCREASE KNOWLEDGE OF SELF CARE AFTER HOSPITALIZATION Description Patient will be able to verbalize self care, incision care and safety precautions for home.  Mod I  Outcome: Progressing

## 2018-10-18 NOTE — Progress Notes (Signed)
Occupational Therapy Session Note  Patient Details  Name: [name] MRN: [id number] Date of Birth: Sep 06, 1946  Today's Date: 10/18/2018 OT Individual Time: 1400-1430 OT Individual Time Calculation (min): 30 min    Short Term Goals: Week 1:  OT Short Term Goal 1 (Week 1): STG=LTG due to LOS  Skilled Therapeutic Interventions/Progress Updates:    1:1. Pt with no c/o pain during session. Pt completes all transfers at supervision level with min VC for reach back to chair. Pt completes switching laundry washer>dryer in standing with use of reacher and set up. Pt completes changing bed sheet task with VC for adherence to precuations and strategies to manage RW/use reacher throughout task. Pt requires 1 seated rest break d/t decreased activity tolerance. Exited session with pt seated in bed, exit alarm on, call light in reach and all needs met  Therapy Documentation Precautions:  Precautions Precautions: Posterior Hip Precaution Booklet Issued: Yes (comment) Precaution Comments: precautions reviewed with pt Restrictions Weight Bearing Restrictions: Yes RLE Weight Bearing: Weight bearing as tolerated RLE Partial Weight Bearing Percentage or Pounds: 50 General:   Vital Signs: Therapy Vitals Temp: 98 F (36.7 C) Temp Source: Oral Pulse Rate: 88 Resp: 17 BP: (!) 148/74 Patient Position (if appropriate): Lying Oxygen Therapy SpO2: 97 % O2 Device: Room Air Pain: Pain Assessment Pain Score: 0-No pain Faces Pain Scale: No hurt PAINAD (Pain Assessment in Advanced Dementia) Breathing: normal Facial Expression: smiling or inexpressive Body Language: relaxed Consolability: no need to console ADL:   Vision   Perception    Praxis   Exercises:   Other Treatments:     Therapy/Group: Individual Therapy  Tonny Branch 10/18/2018, 4:05 PM

## 2018-10-18 NOTE — Progress Notes (Signed)
Physical Therapy Session Note  Patient Details  Name: [name] MRN: [id number] Date of Birth: 1946-10-19  Today's Date: 10/18/2018 PT Individual Time: 1645-1730 PT Individual Time Calculation (min): 45 min   Short Term Goals: Week 1:  PT Short Term Goal 1 (Week 1): =LTG due to estimated LOS  Skilled Therapeutic Interventions/Progress Updates:   Pt received sitting EOB and agreeable to PT. Stand pivot transfer to [facility] with RW and supervision assist. WC mobility x 254f with supervision assist min cues for turning technique through doorway.   Retrieved clothes from dryer with reacher to maintain hip precautions. Supervision assist from PT for safety with min cues for AD and [facility]   Gait training with RW x 131fand supervison assist from PT for safety. Min cues for use of step to gait pattern and increased use UE on RW to reduce pain in the RLE.   Standing tolerance while engaged in Wii sports bowling x 108m Intermittent UE support on the RW as well as intermittent cues for improved posture instanding, pt reports no increase in pain following standing tolerance.   Patient returned to room and [facility] and all needs met.         Therapy Documentation Precautions:  Precautions Precautions: Posterior Hip Precaution Booklet Issued: Yes (comment) Precaution Comments: precautions reviewed with pt Restrictions Weight Bearing Restrictions: Yes RLE Weight Bearing: Weight bearing as tolerated   Pain:   3/10 R hip with movement.    Therapy/Group: Individual Therapy  AusLorie Phenix/04/2018, 5:37 PM

## 2018-10-18 NOTE — Progress Notes (Signed)
Occupational Therapy Session Note  Patient Details  Name: [name] MRN: [id number] Date of Birth: 02/08/1946  Today's Date: 10/18/2018 OT Individual Time: [id number] OT Individual Time Calculation (min): 75 min    Short Term Goals: Week 1:  OT Short Term Goal 1 (Week 1): STG=LTG due to LOS  Skilled Therapeutic Interventions/Progress Updates:    Pt seen for OT ADL bathing/dressing session. Pt sitting EOB upon arrival with daughter present. PT denying pain and agreeable to tx session.  Throughout session, pt completed functional ambulation within room with supervision using RW. She gathered clothing items in prep for shower, using reacher to retrieve items from low surfaces and requiring min cuing to adhere to WBing pre-cautions during functional tasks and fall rsik. SHe ambulated into bathroom, toileting task completed x2 during session with supervision. SHe bathed seated on tub transfer bench, LH sponge to wash LEs and standing to complete pericare/buttock hygiene using grab bar  For support. SUperverision overall for bathing/dressing tasks using AE.  Education provided throughout regading modified methods for self-care tasks including shaving legs, etc.  SHe ambulated to sink and completed grooming tasks standing at sink at distant supervision-mod I level.  She ambulated to ADL apartment using RW with supervision. Completed sit<>Stand from low soft surface couch with supervision and VCs for technique. Completed simulated shower stall transfer as well as tub/shower transfer utilizing tub transfer bench as she has both options at home. Pt able to complete [facility] with supervision following demonstration for technique. When utilizing tub transfer bench, required assist to manage R LE over tub wall while maintaining hip pre-cautions.  Pt returned to room total A in w/c for time and energy conservation. Pt left seated in w/c with all needs in reach and daughter  present.   Education provided to pt and pt's daughter regarding CLOF, DME, fall risk and environmental modifications to reduce falls, continuum of care, and d/c planning.   Therapy Documentation Precautions:  Precautions Precautions: Posterior Hip Precaution Booklet Issued: Yes (comment) Precaution Comments: precautions reviewed with pt Restrictions Weight Bearing Restrictions: Yes RLE Weight Bearing: Weight bearing as tolerated  Pain:   no/denies pain   Therapy/Group: Individual Therapy  Xavius Spadafore L 10/18/2018, 6:51 AM

## 2018-10-18 NOTE — Progress Notes (Signed)
Occupational Therapy Session Note  Patient Details  Name: [name] MRN: [id number] Date of Birth: 10/04/1946  Today's Date: 10/18/2018 OT Individual Time: 1030-1100 OT Individual Time Calculation (min): 30 min    Short Term Goals: Week 1:  OT Short Term Goal 1 (Week 1): STG=LTG due to LOS  Skilled Therapeutic Interventions/Progress Updates:    1:1. Pt and daughter present. 5/10 pain in hip and RN delivers medication. Pt/daughter educated on toilet transfer, supervision, providing steadying at hips and RW management. Daughter supervises bed>toilet ambulation with RW with appropriate VC and checked off on safety plan to complete with pt. Pt stands at sink to gather clothing using reacher to place into bag. Pt transfers clothing into washing machine and turns on appropriately. Pt ambulates back to room with RW and supervision from daughter and OT with w/c follow. Exited session with pt seated EOB and call light in reach  Therapy Documentation Precautions:  Precautions Precautions: Posterior Hip Precaution Booklet Issued: Yes (comment) Precaution Comments: precautions reviewed with pt Restrictions Weight Bearing Restrictions: Yes RLE Weight Bearing: Weight bearing as tolerated RLE Partial Weight Bearing Percentage or Pounds: 50 General:     Therapy/Group: Individual Therapy  Shon HaleStephanie M Errik Mitchelle 10/18/2018, 12:08 PM

## 2018-10-19 ENCOUNTER — Inpatient Hospital Stay (HOSPITAL_COMMUNITY): Payer: Medicare Other

## 2018-10-19 ENCOUNTER — Inpatient Hospital Stay (HOSPITAL_COMMUNITY): Payer: Medicare Other | Admitting: Physical Therapy

## 2018-10-19 DIAGNOSIS — S72001D Fracture of unspecified part of neck of right femur, subsequent encounter for closed fracture with routine healing: Principal | ICD-10-CM

## 2018-10-19 MED ORDER — DARIFENACIN HYDROBROMIDE ER 15 MG PO TB24
15.0000 mg | ORAL_TABLET | Freq: Every day | ORAL | Status: DC
  Administered 2018-10-19 – 2018-10-21 (×3): 15 mg via ORAL
  Filled 2018-10-19 (×4): qty 1
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
## 2018-10-19 NOTE — Plan of Care (Signed)
  Problem: RH PAIN MANAGEMENT Goal: RH STG PAIN MANAGED AT OR BELOW PT'S PAIN GOAL Description Less than 4  Outcome: Progressing  Assess pain, administered pain regimen as ordered

## 2018-10-19 NOTE — Progress Notes (Signed)
Occupational Therapy Session Note  Patient Details  Name: OCIE STANZIONE MRN: [id number] Date of Birth: 06/16/1946  Today's Date: 10/19/2018 OT Individual Time: 1000-1100 OT Individual Time Calculation (min): 60 min    Short Term Goals: Week 1:  OT Short Term Goal 1 (Week 1): STG=LTG due to LOS  Skilled Therapeutic Interventions/Progress Updates:    1:1. Pt reporting pain from sutures being removed but tolerable. Pt requests to go to bathroom prior to showering. Pt able to completes all mobility with RW and supervision for transfers to BSC/TTB and w/c. Pt has B&B with increased time. Pt bathes after toileting with supervision using LHSS to wash BLE and standign to wash buttocks. Pt dons clothing seated in w/c at sink at sit to stand level with supervision and A only to don teds. Pt able to don B shoes no AE. tp grooms standing at sink with supervision for standing tolerance. Pt completes ambulation around gift shop with RW and VC for using reacher to obtain items from out of reach while widow shopping to maintain hip precautions. Exited session with pt seated in reclienr, call light in reach and all needs met  Therapy Documentation Precautions:  Precautions Precautions: Posterior Hip Precaution Booklet Issued: Yes (comment) Precaution Comments: precautions reviewed with pt Restrictions Weight Bearing Restrictions: Yes RLE Weight Bearing: Weight bearing as tolerated RLE Partial Weight Bearing Percentage or Pounds: 50 General: General PT Missed Treatment Reason: Nursing care Vital Signs: Therapy Vitals Pulse Rate: 77 Resp: 18 BP: (!) 143/61 Patient Position (if appropriate): Sitting Oxygen Therapy SpO2: 99 % O2 Device: Room Air Pain:   Therapy/Group: Individual Therapy  Tonny Branch 10/19/2018, 10:10 AM

## 2018-10-19 NOTE — Progress Notes (Signed)
[place] PHYSICAL MEDICINE & REHABILITATION PROGRESS NOTE  Subjective/Complaints: Pt states she was initially 50% WB post op last ortho note 12/2 stated WBAT + Nocturia ROS: + Urinary frequency.  Denies CP, shortness of breath, nausea, vomiting, diarrhea, dysuria, foul odor from urine.  Objective: Vital Signs: Blood pressure (!) 153/69, pulse 72, temperature 97.7 F (36.5 C), temperature source Axillary, resp. rate 14, height 5\' 4"  (1.626 m), weight 76 kg, SpO2 98 %. No results found. No results for input(s): WBC, HGB, HCT, PLT in the last 72 hours. No results for input(s): NA, K, CL, CO2, GLUCOSE, BUN, CREATININE, CALCIUM in the last 72 hours.  Physical Exam: BP (!) 153/69 (BP Location: Right Arm)   Pulse 72   Temp 97.7 F (36.5 C) (Axillary)   Resp 14   Ht 5\' 4"  (1.626 m)   Wt 76 kg   SpO2 98%   BMI 28.76 kg/m  Constitutional: Well-developed.  Well-nourished. HENT: Normocephalic.  Atraumatic. Eyes: EOMI.  No discharge. Cardiovascular: RRR.  No JVD. Respiratory: Effort normal and breath sounds normal.  GI: Bowel sounds are normal.  Nondistended. Musculoskeletal: Right hip with edema and tenderness, improving  Neurological: She is alert and oriented.  Motor: Bilateral upper extremities: 5/5 proximal distal Left lower extremity: Hip flexion 4+/5, knee extension 4+/5, ankle dorsiflexion 5/5, unchanged Right lower extremity: Hip flexion 4-/5, knee extension 4-4+/5, ankle dorsiflexion 5/5, unchanged Skin: Skin is warm and dry. She is not diaphoretic.  Right hip incision with sutures C/D/I.  BLE with multiple telangiectasias.    Psychiatric: Flat.  Assessment/Plan: 1. Functional deficits secondary to right hip fracture status post hemiarthroplasty which require 3+ hours per day of interdisciplinary therapy in a comprehensive inpatient rehab setting.  Physiatrist is providing close team supervision and 24 hour management of active medical problems listed  below.  Physiatrist and rehab team continue to assess barriers to discharge/monitor patient progress toward functional and medical goals  Care Tool:  Bathing    Body parts bathed by patient: Right arm, Left arm, Chest, Abdomen, Front perineal area, Buttocks, Right upper leg, Left upper leg, Face, Right lower leg, Left lower leg   Body parts bathed by helper: Right lower leg, Left lower leg     Bathing assist Assist Level: Supervision/Verbal cueing     Upper Body Dressing/Undressing Upper body dressing   What is the patient wearing?: Pull over shirt    Upper body assist Assist Level: Independent    Lower Body Dressing/Undressing Lower body dressing      What is the patient wearing?: Underwear/pull up, Pants     Lower body assist Assist for lower body dressing: Supervision/Verbal cueing     Toileting Toileting    Toileting assist Assist for toileting: Supervision/Verbal cueing     Transfers Chair/bed transfer  Transfers assist     Chair/bed transfer assist level: Supervision/Verbal cueing     Locomotion Ambulation   Ambulation assist      Assist level: Supervision/Verbal cueing Assistive device: Walker-rolling Max distance: 100   Walk 10 feet activity   Assist     Assist level: Supervision/Verbal cueing Assistive device: Walker-rolling   Walk 50 feet activity   Assist    Assist level: Supervision/Verbal cueing Assistive device: Walker-rolling    Walk 150 feet activity   Assist Walk 150 feet activity did not occur: Safety/medical concerns         Walk 10 feet on uneven surface  activity   Assist     Assist level: Minimal  Assistance - Patient > 75%     Wheelchair     Assist Will patient use wheelchair at discharge?: No             Wheelchair 50 feet with 2 turns activity    Assist            Wheelchair 150 feet activity     Assist            Medical Problem List and Plan: 1.  Deficits  with mobility, transfers, endurance, self-care secondary to Right hip fracture s/p THA on 10/08/2018.  Continue CIR  DC sutures 2.  DVT Prophylaxis/Anticoagulation: Pharmaceutical: Lovenox 3. Pain Management: Hydrocodone prn 4. Mood: LCSW to follow for evaluation and support.  5. Neuropsych: This patient is capable of making decisions on her own behalf. 6. Skin/Wound Care: Routine pressure relief measures.  7. Fluids/Electrolytes/Nutrition: Monitor I/O.   BMP within acceptable range on 12/3 8. Metabolic bone disease: On Vitamin D and Calcium--patient has been refusing despite education.  9. ABLA : Monitor H/H with serial checks. Has been refusing MVI--will avoid iron due to ongoing nausea/ constipation issues.   Hemoglobin 11.1 on 12/3  Labs ordered for Monday  Continue to monitor 10. 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 Increased senna S to 2 pills at HS.   11.  Thrombocytopenia: Resolved  Plts 158 on 12/23  Labs ordered for Monday 12.  Frequency/Urgency: UA negative--monitor for now. Recently completed 4-6 weeks oral vancomycin for C diff.    Urine culture with 40,000 and E. coli, likely contaminant given no other symptoms of infection.  Given recent C. difficile will attempt to avoid antibiotics at present  PVRs relatively unremarkable  Pyridium started on 12/3  Enablex started on 12/4, timing changed on 12/6, increase to 15mg  on 12/7 13. Low calorie malnutrition: Prealbumin levels low at 11.2.  Offer snacks between meals as refusing supplements.   14. Hyperglycemia:  Stress induced v/s steroid induced--receive Kenalog inj for URI recently.   Hgb A1c 5.2 on 12/3 15.  Elevated blood pressure  Monitor with increased mobility  Lisinopril 2.5 started on 12/5  Improving on 12/6 16.  Hypoalbuminemia  Supplement initiated on 12/3  LOS: 5 days A FACE TO FACE EVALUATION WAS PERFORMED  Erick Colacendrew E Rasha Ibe 10/19/2018, 7:37 AM

## 2018-10-20 NOTE — Progress Notes (Signed)
[facility] PHYSICAL MEDICINE & REHABILITATION PROGRESS NOTE  Subjective/Complaints: Slept better due to some improvement + Nocturia ROS: + Urinary frequency.  Denies CP, shortness of breath, nausea, vomiting, diarrhea, dysuria, foul odor from urine.  Objective: Vital Signs: Blood pressure (!) 152/64, pulse 77, temperature 97.8 F (36.6 C), temperature source Oral, resp. rate 18, height 5\' 4"  (1.626 m), weight 76 kg, SpO2 97 %. No results found. No results for input(s): WBC, HGB, HCT, PLT in the last 72 hours. No results for input(s): NA, K, CL, CO2, GLUCOSE, BUN, CREATININE, CALCIUM in the last 72 hours.  Physical Exam: BP (!) 152/64 (BP Location: Left Arm)   Pulse 77   Temp 97.8 F (36.6 C) (Oral)   Resp 18   Ht 5\' 4"  (1.626 m)   Wt 76 kg   SpO2 97%   BMI 28.76 kg/m  Constitutional: Well-developed.  Well-nourished. HENT: Normocephalic.  Atraumatic. Eyes: EOMI.  No discharge. Cardiovascular: RRR.  No JVD. Respiratory: Effort normal and breath sounds normal.  GI: Bowel sounds are normal.  Nondistended. Musculoskeletal: Right hip with edema and tenderness, improving  Neurological: She is alert and oriented.  Motor: Bilateral upper extremities: 5/5 proximal distal Left lower extremity: Hip flexion 4+/5, knee extension 4+/5, ankle dorsiflexion 5/5, unchanged Right lower extremity: Hip flexion 4-/5, knee extension 4-4+/5, ankle dorsiflexion 5/5, unchanged Skin: Skin is warm and dry. She is not diaphoretic.  Right hip incision with sutures C/D/I.  BLE with multiple telangiectasias.    Psychiatric: Flat.  Assessment/Plan: 1. Functional deficits secondary to right hip fracture status post hemiarthroplasty which require 3+ hours per day of interdisciplinary therapy in a comprehensive inpatient rehab setting.  Physiatrist is providing close team supervision and 24 hour management of active medical problems listed below.  Physiatrist and rehab team continue to assess barriers to  discharge/monitor patient progress toward functional and medical goals  Care Tool:  Bathing    Body parts bathed by patient: Right arm, Left arm, Chest, Abdomen, Front perineal area, Buttocks, Right upper leg, Left upper leg, Face, Right lower leg, Left lower leg   Body parts bathed by helper: Right lower leg, Left lower leg     Bathing assist Assist Level: Supervision/Verbal cueing     Upper Body Dressing/Undressing Upper body dressing   What is the patient wearing?: Pull over shirt    Upper body assist Assist Level: Independent    Lower Body Dressing/Undressing Lower body dressing      What is the patient wearing?: Underwear/pull up, Pants     Lower body assist Assist for lower body dressing: Supervision/Verbal cueing     Toileting Toileting    Toileting assist Assist for toileting: Minimal Assistance - Patient > 75%     Transfers Chair/bed transfer  Transfers assist     Chair/bed transfer assist level: Supervision/Verbal cueing     Locomotion Ambulation   Ambulation assist      Assist level: Supervision/Verbal cueing Assistive device: Walker-rolling Max distance: 50   Walk 10 feet activity   Assist     Assist level: Supervision/Verbal cueing Assistive device: Walker-rolling   Walk 50 feet activity   Assist    Assist level: Supervision/Verbal cueing Assistive device: Walker-rolling    Walk 150 feet activity   Assist Walk 150 feet activity did not occur: Safety/medical concerns  Assist level: Supervision/Verbal cueing Assistive device: Walker-rolling    Walk 10 feet on uneven surface  activity   Assist     Assist level: Minimal Assistance -  Patient > 75%     Wheelchair     Assist Will patient use wheelchair at discharge?: No      Wheelchair assist level: Supervision/Verbal cueing Max wheelchair distance: 150    Wheelchair 50 feet with 2 turns activity    Assist        Assist Level: Supervision/Verbal  cueing   Wheelchair 150 feet activity     Assist     Assist Level: Supervision/Verbal cueing      Medical Problem List and Plan: 1.  Deficits with mobility, transfers, endurance, self-care secondary to Right hip fracture s/p THA on 10/08/2018.  Continue CIR  DC sutures 2.  DVT Prophylaxis/Anticoagulation: Pharmaceutical: Lovenox 3. Pain Management: Hydrocodone prn 4. Mood: LCSW to follow for evaluation and support.  5. Neuropsych: This patient is capable of making decisions on her own behalf. 6. Skin/Wound Care: Routine pressure relief measures.  7. Fluids/Electrolytes/Nutrition: Monitor I/O.   BMP within acceptable range on 12/3 8. Metabolic bone disease: On Vitamin D and Calcium--patient has been refusing despite education.  9. ABLA : Monitor H/H with serial checks. Has been refusing MVI--will avoid iron due to ongoing nausea/ constipation issues.   Hemoglobin 11.1 on 12/3  Labs ordered for Monday  Continue to monitor 10. 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 Increased senna S to 2 pills at HS.   11.  Thrombocytopenia: Resolved  Plts 158 on 12/23  Labs ordered for Monday 12.  Frequency/Urgency: UA negative--monitor for now. Recently completed 4-6 weeks oral vancomycin for C diff.    Urine culture with 40,000 and E. coli, likely contaminant given no other symptoms of infection.  Given recent C. difficile will attempt to avoid antibiotics at present  PVRs 81ml yesterday  Pyridium started on 12/3  Enablex started on 12/4, timing changed on 12/6, increase to 15mg  on 12/7 13. Low calorie malnutrition: Prealbumin levels low at 11.2.  Offer snacks between meals as refusing supplements.   14. Hyperglycemia:  Stress induced v/s steroid induced--receive Kenalog inj for URI recently.   Hgb A1c 5.2 on 12/3 15.  Elevated blood pressure  Monitor with increased mobility  Lisinopril 2.5 started on 12/5  Improving on 12/6 16.  Hypoalbuminemia  Supplement initiated on 12/3  LOS:  6 days A FACE TO FACE EVALUATION WAS PERFORMED  [name] [name] 10/20/2018, 7:35 AM

## 2018-10-21 ENCOUNTER — Inpatient Hospital Stay (HOSPITAL_COMMUNITY): Payer: Medicare Other

## 2018-10-21 ENCOUNTER — Inpatient Hospital Stay (HOSPITAL_COMMUNITY): Payer: Medicare Other | Admitting: Physical Therapy

## 2018-10-21 ENCOUNTER — Inpatient Hospital Stay (HOSPITAL_COMMUNITY): Payer: Medicare Other | Admitting: Occupational Therapy

## 2018-10-21 DIAGNOSIS — I10 Essential (primary) hypertension: Secondary | ICD-10-CM

## 2018-10-21 LAB — PTH, INTACT AND CALCIUM
Calcium, Total (PTH): 8.7 mg/dL (ref 8.7–10.3)
PTH: 20 pg/mL (ref 15–65)

## 2018-10-21 LAB — CBC
HCT: 41.2 % (ref 36.0–46.0)
Hemoglobin: 12 g/dL (ref 12.0–15.0)
MCH: 26.6 pg (ref 26.0–34.0)
MCHC: 29.1 g/dL — ABNORMAL LOW (ref 30.0–36.0)
MCV: 91.4 fL (ref 80.0–100.0)
Platelets: 247 10*3/uL (ref 150–400)
RBC: 4.51 MIL/uL (ref 3.87–5.11)
RDW: 14 % (ref 11.5–15.5)
WBC: 7.7 10*3/uL (ref 4.0–10.5)
nRBC: 0 % (ref 0.0–0.2)

## 2018-10-21 LAB — BASIC METABOLIC PANEL
Anion gap: 10 (ref 5–15)
BUN: 18 mg/dL (ref 8–23)
CO2: 30 mmol/L (ref 22–32)
Calcium: 9.5 mg/dL (ref 8.9–10.3)
Chloride: 100 mmol/L (ref 98–111)
Creatinine, Ser: 0.9 mg/dL (ref 0.44–1.00)
GFR calc Af Amer: >60 mL/min (ref >60)
GFR calc non Af Amer: >60 mL/min (ref >60)
Glucose, Bld: 106 mg/dL — ABNORMAL HIGH (ref 70–99)
Potassium: 4.4 mmol/L (ref 3.5–5.1)
Sodium: 140 mmol/L (ref 135–145)

## 2018-10-21 LAB — CALCIUM, IONIZED: Calcium, Ionized, Serum: 5.1 mg/dL (ref 4.5–5.6)

## 2018-10-21 MED ORDER — LISINOPRIL 5 MG PO TABS
2.5000 mg | ORAL_TABLET | Freq: Once | ORAL | Status: DC
  Filled 2018-10-21: qty 1

## 2018-10-21 MED ORDER — LISINOPRIL 5 MG PO TABS
5.0000 mg | ORAL_TABLET | Freq: Every day | ORAL | Status: DC
  Filled 2018-10-21: qty 1

## 2018-10-21 NOTE — Progress Notes (Signed)
Writer resumed education on bp and lisinopril. She noted, "I will only take the 2.5mg  and see, if it would help my blood pressure. I really do not want to take it."   MD is aware of patient refusing medication .

## 2018-10-21 NOTE — Progress Notes (Signed)
Occupational Therapy Session Note  Patient Details  Name: [name] MRN: [id number] Date of Birth: 11/18/1945  Today's Date: 10/21/2018 OT Individual Time: [id number] OT Individual Time Calculation (min): 60 min    Short Term Goals: Week 1:  OT Short Term Goal 1 (Week 1): STG=LTG due to LOS  Skilled Therapeutic Interventions/Progress Updates:    Pt seen for OT ADL bathing/dressing session. Pt sitting EOB upon arrival, denying pain and agreeable to tx session.  THroughout session. She completed functional ambulation and transfers at distant supervision-mod I level. She ambulated throughout room to gather clothing items in prep for shower, using reacher to assist and maintaining of hip pre-cautions. She completed toileting task with distant supervision and close supervision to complete shower stall transfer. She bathed with set-up seated on tub transfer bench. She returned to toilet to dress, using AE with increased time to thread LEs into pants and for assist to don socks/shoes, able to indepently recalll proper use of all AE. Grooming completed mod I standing at sink. She ambulated throughout unit to pt laundry facility using w/c for support in order to facilitate decreased reliance on UEs with use of walker. Ambulation completed with supervision and noted increase in ambulation speed using w/c vs. RW. She completed laundry task at mod I level to place items in washer. Pt returned to room in same manner as described above. Left seated in w/c with all needs in reach.   Therapy Documentation Precautions:  Precautions Precautions: Posterior Hip Precaution Booklet Issued: Yes (comment) Precaution Comments: precautions reviewed with pt Restrictions Weight Bearing Restrictions: Yes RLE Weight Bearing: Weight bearing as tolerated RLE Partial Weight Bearing Percentage or Pounds: 50 Pain:   No/denies pain   Therapy/Group: Individual Therapy  Dusti Tetro L 10/21/2018, 7:07 AM

## 2018-10-21 NOTE — Progress Notes (Signed)
Informed Patel, MD, pt has been refusing lisinopril. Writer has educated her several times and she continues to refused.

## 2018-10-21 NOTE — Progress Notes (Signed)
Occupational Therapy Session Note  Patient Details  Name: [name] MRN: [id number] Date of Birth: 12/16/1945  Today's Date: 10/21/2018 OT Individual Time: [id number] OT Individual Time Calculation (min): 41 min    Short Term Goals: Week 1:  OT Short Term Goal 1 (Week 1): STG=LTG due to LOS  Skilled Therapeutic Interventions/Progress Updates:    Session focused on tub and walk in shower transfers. Collaborated with pt re problem solving accessing water control and shower hose. Introduced dressing stick to assist with maintaining hip precautions while reaching forward in shower. Extensive demonstration and cueing for adherence to hip precautions during swinging legs over bench. Mod I overall for transfer in <> out of tub shower, (S) during walk in shower. Pt completed laundry task, using reacher to grab clothes out of dryer. Pt returned to room, completing 200 ft of functional mobility throughout session with rollator.   Therapy Documentation Precautions:  Precautions Precautions: Posterior Hip Precaution Booklet Issued: Yes (comment) Precaution Comments: precautions reviewed with pt Restrictions Weight Bearing Restrictions: Yes RLE Weight Bearing: Weight bearing as tolerated RLE Partial Weight Bearing Percentage or Pounds: 50 Pain: Pain Assessment Pain Scale: 0-10 Pain Score: 6    Therapy/Group: Individual Therapy  Crissie ReeseSandra H Johniece Hornbaker 10/21/2018, 12:25 PM

## 2018-10-21 NOTE — Progress Notes (Signed)
Physical Therapy Session Note  Patient Details  Name: [name] MRN: [id number] Date of Birth: 09/01/1946  Today's Date: 10/21/2018 PT Individual Time: 1000-1100 PT Individual Time Calculation (min): 60 min   Short Term Goals: Week 1:  PT Short Term Goal 1 (Week 1): =LTG due to estimated LOS  Skilled Therapeutic Interventions/Progress Updates: Pt received seated in w/c, c/o pain 5/10 in RLE pre-medicated and agreeable to treatment. Gait trial with rollator to gym with S; pt demo's increased gait speed with rollator vs RW. Discussed pros/cons of transport chair vs rollator for community ambulation; by end of session pt determined she would rather have rollator as it would keep her more active and she would get more use out of it than RW; alerted CSW. Standing progressive balance exercises on airex foam pad with normal BOS, feet together, staggered and tandem stance no UE support with eyes open. One trial normal BOS and feet together with eyes closed; min guard with increased sway d/t decr visual input. Stairs ascent/descent 4 steps 6" height with L handrail (ascent) with close S. Trial transfer onto elevated bed to simulate home environment. D/t daughters report, bed at her house it just above knee height, however pt believes it is taller and her bed at home is taller as well. Requires minA and leg lifter to elevate RLE onto high bed. Discussed options for removing boxspring, sleeping in guest bedroom which has lower bed instead. ModI ambulation in/out of bathroom and modI hygiene and clothing management. Pt made modI at end of session, alerted RN and updated safety plan. Remained supine in bed at end of session, all needs in reach.      Therapy Documentation Precautions:  Precautions Precautions: Posterior Hip Precaution Booklet Issued: Yes (comment) Precaution Comments: precautions reviewed with pt Restrictions Weight Bearing Restrictions: Yes RLE Weight Bearing: Weight bearing as  tolerated RLE Partial Weight Bearing Percentage or Pounds: 50    Therapy/Group: Individual Therapy  Harlon Dittylizabeth J Freman Lapage 10/21/2018, 11:26 AM

## 2018-10-21 NOTE — Progress Notes (Signed)
[facility] PHYSICAL MEDICINE & REHABILITATION PROGRESS NOTE  Subjective/Complaints: Patient seen sitting up at the edge of her bed this morning.  She states she slept well overnight.  She notes improvement in bladder function.  She states she had a good weekend.  ROS: Denies CP, shortness of breath, nausea, vomiting, diarrhea, dysuria, foul odor from urine.  Objective: Vital Signs: Blood pressure (!) 165/65, pulse 75, temperature 97.9 F (36.6 C), temperature source Oral, resp. rate 17, height 5\' 4"  (1.626 m), weight 76 kg, SpO2 98 %. No results found. Recent Labs    10/21/18 0604  WBC 7.7  HGB 12.0  HCT 41.2  PLT 247   Recent Labs    10/21/18 0604  NA 140  K 4.4  CL 100  CO2 30  GLUCOSE 106*  BUN 18  CREATININE 0.90  CALCIUM 9.5    Physical Exam: BP (!) 165/65 (BP Location: Left Arm)   Pulse 75   Temp 97.9 F (36.6 C) (Oral)   Resp 17   Ht 5\' 4"  (1.626 m)   Wt 76 kg   SpO2 98%   BMI 28.76 kg/m  Constitutional: Well-developed.  Well-nourished. HENT: Normocephalic.  Atraumatic. Eyes: EOMI.  No discharge. Cardiovascular: RRR.  No JVD. Respiratory: Effort normal and breath sounds normal.  GI: Bowel sounds are normal.  Nondistended. Musculoskeletal: Right hip with edema and tenderness, improving  Neurological: She is alert and oriented.  Motor: Bilateral upper extremities: 5/5 proximal distal Left lower extremity: Hip flexion 4+/5, knee extension 4+/5, ankle dorsiflexion 5/5, stable Right lower extremity: Hip flexion 4/5, knee extension 4+/5, ankle dorsiflexion 5/5  Skin: Skin is warm and dry. She is not diaphoretic.  Right hip incision with sutures C/D/I.  BLE with multiple telangiectasias.    Psychiatric: Flat.  Assessment/Plan: 1. Functional deficits secondary to right hip fracture status post hemiarthroplasty which require 3+ hours per day of interdisciplinary therapy in a comprehensive inpatient rehab setting.  Physiatrist is providing close team  supervision and 24 hour management of active medical problems listed below.  Physiatrist and rehab team continue to assess barriers to discharge/monitor patient progress toward functional and medical goals  Care Tool:  Bathing    Body parts bathed by patient: Right arm, Left arm, Chest, Abdomen, Front perineal area, Buttocks, Right upper leg, Left upper leg, Face, Right lower leg, Left lower leg   Body parts bathed by helper: Right lower leg, Left lower leg     Bathing assist Assist Level: Independent with assistive device     Upper Body Dressing/Undressing Upper body dressing   What is the patient wearing?: Pull over shirt    Upper body assist Assist Level: Independent    Lower Body Dressing/Undressing Lower body dressing      What is the patient wearing?: Underwear/pull up, Pants     Lower body assist Assist for lower body dressing: Supervision/Verbal cueing     Toileting Toileting    Toileting assist Assist for toileting: Supervision/Verbal cueing     Transfers Chair/bed transfer  Transfers assist     Chair/bed transfer assist level: Supervision/Verbal cueing     Locomotion Ambulation   Ambulation assist      Assist level: Supervision/Verbal cueing Assistive device: Walker-rolling Max distance: 20'   Walk 10 feet activity   Assist     Assist level: Supervision/Verbal cueing Assistive device: Walker-rolling   Walk 50 feet activity   Assist    Assist level: Supervision/Verbal cueing Assistive device: Walker-rolling    Walk 150  feet activity   Assist Walk 150 feet activity did not occur: Safety/medical concerns  Assist level: Supervision/Verbal cueing Assistive device: Walker-rolling    Walk 10 feet on uneven surface  activity   Assist     Assist level: Minimal Assistance - Patient > 75%     Wheelchair     Assist Will patient use wheelchair at discharge?: No      Wheelchair assist level: Supervision/Verbal  cueing Max wheelchair distance: 150    Wheelchair 50 feet with 2 turns activity    Assist        Assist Level: Supervision/Verbal cueing   Wheelchair 150 feet activity     Assist     Assist Level: Supervision/Verbal cueing      Medical Problem List and Plan: 1.  Deficits with mobility, transfers, endurance, self-care secondary to Right hip fracture s/p THA on 10/08/2018.  Continue CIR 2.  DVT Prophylaxis/Anticoagulation: Pharmaceutical: Lovenox 3. Pain Management: Hydrocodone prn 4. Mood: LCSW to follow for evaluation and support.  5. Neuropsych: This patient is capable of making decisions on her own behalf. 6. Skin/Wound Care: Routine pressure relief measures.  7. Fluids/Electrolytes/Nutrition: Monitor I/O.   BMP within acceptable range on 12/3 8. Metabolic bone disease: On Vitamin D and Calcium--patient has been refusing despite education.  9. ABLA: Resolved  : Monitor H/H with serial checks. Has been refusing MVI  Hemoglobin 12.0 on 12/9  Continue to monitor 10. 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 Increased senna S to 2 pills at HS.   11.  Thrombocytopenia: Resolved  Plts 247 on 12/9 12.  Frequency/Urgency: UA negative--monitor for now. Recently completed 4-6 weeks oral vancomycin for C diff.    Urine culture with 40,000 and E. coli, likely contaminant given no other symptoms of infection.  Given recent C. difficile will attempt to avoid antibiotics at present  Enablex started on 12/4, timing changed on 12/6, increase to 15mg  on 12/7 13. Low calorie malnutrition: Prealbumin levels low at 11.2.  Offer snacks between meals as refusing supplements.   14. Hyperglycemia:  Stress induced v/s steroid induced--receive Kenalog inj for URI recently.   Hgb A1c 5.2 on 12/3 15.  Elevated blood pressure  Monitor with increased mobility  Lisinopril 2.5 started on 12/5, increased to 5 mg on 12/9 16.  Hypoalbuminemia  Supplement initiated on 12/3  LOS: 7 days A FACE  TO FACE EVALUATION WAS PERFORMED  Ankit Karis Juba 10/21/2018, 9:57 AM

## 2018-10-21 NOTE — Plan of Care (Signed)
  Problem: RH PAIN MANAGEMENT Goal: RH STG PAIN MANAGED AT OR BELOW PT'S PAIN GOAL Description Less than 4  Outcome: Progressing  Assess pain every shift and as needed, administered pain regimen as needed

## 2018-10-21 NOTE — Progress Notes (Signed)
Occupational Therapy Session Note  Patient Details  Name: [name] MRN: [id number] Date of Birth: 09/01/1946  Today's Date: 10/21/2018 OT Individual Time: [id number] OT Individual Time Calculation (min): 29 min    Short Term Goals: Week 1:  OT Short Term Goal 1 (Week 1): STG=LTG due to LOS  Skilled Therapeutic Interventions/Progress Updates:    Upon entering the room, pt supine in bed and requesting pain medications. RN arrived to give medications this session. OT educated pt on energy conservation and fall risks within home environment. Concerns over bed being 30 inches tall discussed with pt. Pt requesting to use bathroom and ambulated with RW and close supervision for safety. Pt managed clothing and hygiene while standing with close supervision as well. Pt returning to sit on edge of bed to await next therapist arrival. All needs within reach.   Therapy Documentation Precautions:  Precautions Precautions: Posterior Hip Precaution Booklet Issued: Yes (comment) Precaution Comments: precautions reviewed with pt Restrictions Weight Bearing Restrictions: Yes RLE Weight Bearing: Weight bearing as tolerated RLE Partial Weight Bearing Percentage or Pounds: 50 General:   Vital Signs: Therapy Vitals Temp: 97.9 F (36.6 C) Temp Source: Oral Pulse Rate: 75 Resp: 17 BP: (!) 165/65 Patient Position (if appropriate): Lying Oxygen Therapy SpO2: 98 % O2 Device: Room Air   Therapy/Group: Individual Therapy  Alen BleacherBradsher, Talal Fritchman P 10/21/2018, 8:45 AM

## 2018-10-22 ENCOUNTER — Inpatient Hospital Stay (HOSPITAL_COMMUNITY): Payer: Medicare Other | Admitting: Occupational Therapy

## 2018-10-22 ENCOUNTER — Inpatient Hospital Stay (HOSPITAL_COMMUNITY): Payer: Medicare Other | Admitting: Physical Therapy

## 2018-10-22 MED ORDER — METHOCARBAMOL 500 MG PO TABS: 500.0000 mg | ORAL_TABLET | Freq: Four times a day (QID) | ORAL | 0 refills | Status: DC | PRN

## 2018-10-22 MED ORDER — SENNOSIDES-DOCUSATE SODIUM 8.6-50 MG PO TABS: 2.0000 | ORAL_TABLET | Freq: Every day | ORAL | 0 refills | Status: DC

## 2018-10-22 MED ORDER — DICLOFENAC SODIUM 1 % TD GEL: 2.0000 g | Freq: Four times a day (QID) | TRANSDERMAL | 0 refills | Status: AC

## 2018-10-22 MED ORDER — DARIFENACIN HYDROBROMIDE ER 15 MG PO TB24: 15.0000 mg | ORAL_TABLET | Freq: Every day | ORAL | 0 refills | Status: DC

## 2018-10-22 MED ORDER — HYDROCODONE-ACETAMINOPHEN 7.5-325 MG PO TABS: 1.0000 | ORAL_TABLET | Freq: Four times a day (QID) | ORAL | 0 refills | Status: DC | PRN

## 2018-10-22 MED ORDER — ENOXAPARIN SODIUM 40 MG/0.4ML ~~LOC~~ SOLN: 40.0000 mg | SUBCUTANEOUS | 0 refills | Status: DC

## 2018-10-22 MED ORDER — SACCHAROMYCES BOULARDII 250 MG PO CAPS: 250.0000 mg | ORAL_CAPSULE | Freq: Two times a day (BID) | ORAL | Status: DC

## 2018-10-22 MED ORDER — CHOLECALCIFEROL 10 MCG/ML (400 UNIT/ML) PO LIQD: 2000.0000 [IU] | Freq: Every day | ORAL | 1 refills | Status: DC

## 2018-10-22 MED ORDER — ADULT MULTIVITAMIN W/MINERALS CH: 1.0000 | ORAL_TABLET | Freq: Every day | ORAL | Status: DC

## 2018-10-22 MED ORDER — LISINOPRIL 5 MG PO TABS
2.5000 mg | ORAL_TABLET | Freq: Every day | ORAL | Status: DC
  Administered 2018-10-22: 2.5 mg via ORAL
  Filled 2018-10-22: qty 1

## 2018-10-22 NOTE — Progress Notes (Signed)
Social Work Discharge Note  The overall goal for the admission was met for:   Discharge location: Yes - pt decided to d/c to her dtr's home near [place], [place].  Her son came today and will spend the night with her and then drive her to her dtr's home tomorrow.  Length of Stay: Yes - 8 days  Discharge activity level: Yes - modified independent  Home/community participation: Yes  Services provided included: MD, RD, PT, OT, RN, Pharmacy and SW  Financial Services: Medicare and Private Insurance: [employer]  Follow-up services arranged: Outpatient: PT, DME: rollator, bedside commode, tub transfer bench and Patient/Family request agency HH: [facility] in [place], [place], DME: [place]  Comments (or additional information): Pt to go home with her dtr for a couple of weeks and then return to her home.    Patient/Family verbalized understanding of follow-up arrangements: Yes  Individual responsible for coordination of the follow-up plan: pt  Confirmed correct DME delivered: Trey Sailors 10/22/2018    Wilver Tignor, Silvestre Mesi

## 2018-10-22 NOTE — Discharge Instructions (Signed)
Inpatient Rehab Discharge Instructions  [name] Discharge date and time:    Activities/Precautions/ Functional Status: Activity: no lifting, driving, or strenuous exercise  till cleared by MD Diet: regular diet Wound Care: keep wound clean and dry   Functional status:  ___ No restrictions     ___ Walk up steps independently ___ 24/7 supervision/assistance   ___ Walk up steps with assistance ___ Intermittent supervision/assistance  ___ Bathe/dress independently ___ Walk with walker     ___ Bathe/dress with assistance ___ Walk Independently    ___ Shower independently ___ Walk with assistance    ___ Shower with assistance ___ No alcohol     ___ Return to work/school ________  COMMUNITY REFERRALS UPON DISCHARGE:   Outpatient: PT  Agency:  [facility]   Phone:  [phone]  Appointment Date/Time:  They will call you for an appointment, but [name] faxed the order and therapy notes to them.  If you do not hear from [facility], give them a call at the above number. Medical Equipment/Items Ordered:  Rollator; tub transfer bench; bedside commode  Agency/Supplier:  [facility]           Phone:  [phone]  Special Instructions:    My questions have been answered and I understand these instructions. I will adhere to these goals and the provided educational materials after my discharge from the hospital.  Patient/Caregiver Signature _______________________________ Date __________  Clinician Signature _______________________________________ Date __________  Please bring this form and your medication list with you to all your follow-up doctor's appointments.

## 2018-10-22 NOTE — Plan of Care (Signed)
  Problem: RH SKIN INTEGRITY Goal: RH STG ABLE TO PERFORM INCISION/WOUND CARE W/ASSISTANCE Description STG Able To Perform Incision/Wound Care With Assistance. Mod I  Outcome: Progressing  Surgical  incision healing no drainage Problem: RH PAIN MANAGEMENT Goal: RH STG PAIN MANAGED AT OR BELOW PT'S PAIN GOAL Description Less than 4  Outcome: Progressing  Assess pain level  every shift and as needed. Administered pain regime as ordered

## 2018-10-22 NOTE — Progress Notes (Addendum)
Occupational Therapy Discharge Summary  Patient Details  Name: [name] MRN: [id number] Date of Birth: November 17, 1945   Patient has met 11 of 11 long term goals due to improved activity tolerance, improved balance, ability to compensate for deficits and improved coordination.  Patient to discharge at overall Supervision- mod I level.  Patient's care partner is independent to provide the necessary physical assistance at discharge.  Pt plans to live with daughter upon d/c until transitioning home independently. Pt's daughter has attended OT family ed session reviewing transfer methods for shower stall and tub transfer bench. Also reviewed pt's CLOF and hip pre-cautions with functional implications. Pt used AE for LB dressing in order to maintain posterior hip pre-cautions. She is able to independently recall and follow hip pre-cautions during functional tasks.  Both pt and daughter feel comfortable and confident with planned d/c to daughter home at [place] I level using rollator.   Recommendation:  Patient with no further OT needs.  Equipment: BSC and TTB  Reasons for discharge: treatment goals met and discharge from hospital  Patient/family agrees with progress made and goals achieved: Yes  OT Discharge Precautions/Restrictions  Precautions Precautions: Posterior Hip Restrictions Weight Bearing Restrictions: Yes RLE Weight Bearing: Weight bearing as tolerated Vision Baseline Vision/History: Wears glasses Wears Glasses: At all times Patient Visual Report: No change from baseline Vision Assessment?: No apparent visual deficits Perception  Perception: Within Functional Limits Praxis Praxis: Intact Cognition Overall Cognitive Status: Within Functional Limits for tasks assessed Arousal/Alertness: Awake/alert Orientation Level: Oriented X4 Memory: Appears intact Awareness: Appears intact Problem Solving: Appears intact Safety/Judgment: Appears  intact Sensation Sensation Light Touch: Appears Intact Proprioception: Appears Intact Coordination Gross Motor Movements are Fluid and Coordinated: Yes Fine Motor Movements are Fluid and Coordinated: Yes Motor  Motor Motor: Within Functional Limits Trunk/Postural Assessment  Cervical Assessment Cervical Assessment: Within Functional Limits Thoracic Assessment Thoracic Assessment: Within Functional Limits Lumbar Assessment Lumbar Assessment: Within Functional Limits Postural Control Postural Control: Within Functional Limits  Balance Balance Balance Assessed: Yes Static Sitting Balance Static Sitting - Balance Support: No upper extremity supported;Feet supported Static Sitting - Level of Assistance: 7: Independent Dynamic Sitting Balance Dynamic Sitting - Balance Support: No upper extremity supported;Feet supported Dynamic Sitting - Level of Assistance: 7: Independent Static Standing Balance Static Standing - Balance Support: During functional activity Static Standing - Level of Assistance: 6: Modified independent (Device/Increase time) Dynamic Standing Balance Dynamic Standing - Balance Support: During functional activity;Right upper extremity supported;Left upper extremity supported Dynamic Standing - Level of Assistance: 6: Modified independent (Device/Increase time) Extremity/Trunk Assessment RUE Assessment RUE Assessment: Within Functional Limits LUE Assessment LUE Assessment: Within Functional Limits   Jakeline Dave L 10/22/2018, 7:48 AM

## 2018-10-22 NOTE — Patient Care Conference (Signed)
Inpatient RehabilitationTeam Conference and Plan of Care Update Date: 10/16/2018   Time: 2:00 PM    Patient Name: [name]      Medical Record Number: [id number]  Date of Birth: 03/20/1946 Sex: Female         Room/Bed: 4M05C/4M05C-01 Payor Info: Payor: MEDICARE / Plan: MEDICARE PART A AND B / Product Type: *No Product type* /    Admitting Diagnosis: R femoral Neck FX THA  Admit Date/Time:  10/14/2018  3:58 PM Admission Comments: No comment available   Primary Diagnosis:  <principal problem not specified> Principal Problem: <principal problem not specified>  Patient Active Problem List   Diagnosis Date Noted  . Hypertension   . Elevated blood pressure reading   . Hypoalbuminemia due to protein-calorie malnutrition (HCC)   . Noncompliance   . Hip fracture requiring operative repair (HCC) 10/14/2018  . Fracture   . Hyperglycemia   . Urinary frequency   . Thrombocytopenia (HCC)   . Drug induced constipation   . Acute blood loss anemia   . Metabolic bone disease   . Post-operative pain   . Generalized osteoarthritis   . Steroid-induced hyperglycemia   . Postoperative pain   . Leukocytosis   . Hip fracture requiring operative repair, right, closed, initial encounter (HCC) 10/08/2018  . Medication intolerance 10/08/2018  . Essential hypertension   . History of Clostridium difficile infection     Expected Discharge Date: Expected Discharge Date: 10/23/18  Team Members Present: Physician leading conference: Dr. Maryla MorrowAnkit Patel Social Worker Present: Staci AcostaJenny Carmel Garfield, LCSW Nurse Present: Regino SchultzeHilary Lilja, RN PT Present: Maren ReamerElizabeth Seigle, PT OT Present: Amy Rounds, OT PPS Coordinator present : Tora DuckMarie Noel, RN, CRRN;Melissa Bowie     Current Status/Progress Goal Weekly Team Focus  Medical   Deficits with mobility, transfers, endurance, self  Improve mobility, safety, transfers, BP, urinary frequency  See above   Bowel/Bladder   LBM 10/15/18. CONT B/B  ASSESS WITH TOILETING NEEDS    PT NOTES SHE NEEDS TO TOILET EVERY TWO HOURS RESULTED TO HER URGENCY.   Swallow/Nutrition/ Hydration             ADL's   Supervision functional mobility and transfers; mod A LB dressing without AE; supervision toileting  Mod I ADLs and IADLs  AE education, functional mobility and endurance, d/c planning   Mobility   min guard bed mobility, S transfers and gait with RW, min A stairs  modI overall, S stairs  activity tolerance, strenthening, gait/stair training, bed mobility   Communication             Safety/Cognition/ Behavioral Observations            Pain   DENIES PAIN, TOLERABLE 3/10  CONTINUE TO ASSESS FOR PAIN, ADMINISTERED PAIN REGIMEN AS NEEDED  CONTINUE TO ASSESS EACH SHIFT AND AS NEEDED   Skin   SURGICAL INCISION RT HIP (SURGEON FOAM) NO NOTED DRAINAGE  CONTINUE ASSESS SKIN EACH SHIFT AND AS NEEDED, NOTE NO S/S INFECTION   CONTINUE TO ASSESS, FOLLOW MD ORDERS.    Rehab Goals Patient on target to meet rehab goals: Yes Rehab Goals Revised: none *See Care Plan and progress notes for long and short-term goals.     Barriers to Discharge  Current Status/Progress Possible Resolutions Date Resolved   Physician    Medical stability;Lack of/limited family support     See above  Therapies, optimize BP meds, urinary frequency      Nursing  PT  Weight bearing restrictions;Lack of/limited family support;Decreased caregiver support                 OT                  SLP                SW                Discharge Planning/Teaching Needs:  Pt is considering going to her dtr's home near [facility] for a little while to recover.  Pt with mod I goals and she can direct her own care as needed.   Team Discussion:  Pt with elevated blood pressure and she has no previous hx of this.  Dr. Allena Katz is monitoring and will start medication if it remains elevated.  Pt with urinary frequency, medication started.  Pt is S to steady A with OT and is doing Artist.  Pt has mod I goals for OT and PT.  Pt using leg lifter and is supervision with txs and gait and min A with stairs.  Revisions to Treatment Plan:  none    Continued Need for Acute Rehabilitation Level of Care: The patient requires daily medical management by a physician with specialized training in physical medicine and rehabilitation for the following conditions: Daily direction of a multidisciplinary physical rehabilitation program to ensure safe treatment while eliciting the highest outcome that is of practical value to the patient.: Yes Daily medical management of patient stability for increased activity during participation in an intensive rehabilitation regime.: Yes Daily analysis of laboratory values and/or radiology reports with any subsequent need for medication adjustment of medical intervention for : Blood pressure problems;Post surgical problems;Urological problems   I attest that I was present, lead the team conference, and concur with the assessment and plan of the team.   Amaranta Mehl, [place] Deck 10/19/2018, 2:06 PM

## 2018-10-22 NOTE — Progress Notes (Signed)
Occupational Therapy Session Note  Patient Details  Name: [name] MRN: [id number] Date of Birth: 09/24/1946  Today's Date: 10/22/2018 OT Individual Time: [id number] OT Individual Time Calculation (min): 60 min    Short Term Goals: Week 1:  OT Short Term Goal 1 (Week 1): STG=LTG due to LOS  Skilled Therapeutic Interventions/Progress Updates:    Pt seen for OT ADL bathing/dressing session. Pt in supine upon arrival, awake and agreeable to tx session, denying pain. She ambulated throughout room using rollator to gather clothing items in prep for shower. Functional transfers and ambulation completed throughout session using rollator at distant supervision-mod I level. She undressed seated on toilet using AE to assist with doffing clothing in order to maintain posterior hip-precautions.  She transitioned to shower and bathed seated on tub transfer bench with distant supervision.  She returned to toilet to dress, again using AE to assist with LB dressing, task completed at mod I level.  Extensive education provided throughout session regarding continuum of care, fall risk and what to do in case of fall, energy conservation, DME and d/c planning. Pt left seated on rollator at end of session, all needs in reach. Pt made Mod I in room in prep for d/c home at mod I level today.   Therapy Documentation Precautions:  Precautions Precautions: Posterior Hip Precaution Booklet Issued: Yes (comment) Precaution Comments: precautions reviewed with pt Restrictions Weight Bearing Restrictions: Yes RLE Weight Bearing: Weight bearing as tolerated   Therapy/Group: Individual Therapy  [name] L 10/22/2018, 7:00 AM

## 2018-10-22 NOTE — Progress Notes (Signed)
Pt refuses 5mg  lisinopril, but she is willing to take 2.5mg  lisinopril

## 2018-10-22 NOTE — Progress Notes (Signed)
Educated pt on discharge instruction, medications, safety transfers, and she has all belongings. PA educated pt , as well. Staff transferred pt to lobby.

## 2018-10-22 NOTE — Progress Notes (Signed)
Patient stable for discharge today.  Refusing blood pressure treatment.  Please also see discharge summary.  We will see patient back in 1-2 weeks for transitional care management.

## 2018-10-22 NOTE — Progress Notes (Signed)
Physical Therapy Discharge Summary  Patient Details  Name: [name] MRN: [id number] Date of Birth: 04/08/1946  Today's Date: 10/22/2018 PT Individual Time: 1000-1100 PT Individual Time Calculation (min): 60 min    Patient has met 10 of 10 long term goals due to improved activity tolerance, improved balance, improved postural control, increased strength, increased range of motion, decreased pain, ability to compensate for deficits and functional use of  right lower extremity.  Patient to discharge at an ambulatory level Modified Independent.   Patient's care partner is independent to provide the necessary physical assistance at discharge.  Reasons goals not met: All goals met  Recommendation:  Patient will benefit from ongoing skilled PT services in outpatient setting to continue to advance safe functional mobility, address ongoing impairments in strength, ROM, activity tolerance, balance, and minimize fall risk.  Equipment: rollator  Reasons for discharge: treatment goals met and discharge from hospital  Patient/family agrees with progress made and goals achieved: Yes  PT Discharge Precautions/Restrictions Precautions Precautions: Posterior Hip Restrictions Weight Bearing Restrictions: Yes RLE Weight Bearing: Weight bearing as tolerated Vital Signs Therapy Vitals Pulse Rate: 83 Resp: 18 BP: (!) 144/63 Patient Position (if appropriate): Sitting Oxygen Therapy SpO2: 98 % O2 Device: Room Air Vision/Perception  Perception Perception: Within Functional Limits Praxis Praxis: Intact  Cognition Overall Cognitive Status: Within Functional Limits for tasks assessed Arousal/Alertness: Awake/alert Orientation Level: Oriented X4 Memory: Appears intact Awareness: Appears intact Problem Solving: Appears intact Safety/Judgment: Appears intact Sensation Sensation Light Touch: Appears Intact Proprioception: Appears Intact Coordination Gross Motor Movements are Fluid and  Coordinated: Yes Fine Motor Movements are Fluid and Coordinated: Yes Motor  Motor Motor: Within Functional Limits  Mobility Bed Mobility Bed Mobility: Sit to Supine;Supine to Sit Supine to Sit: Independent with assistive device Sit to Supine: Independent with assistive device Transfers Transfers: Sit to Stand;Stand to Sit;Stand Pivot Transfers Sit to Stand: Independent with assistive device Stand to Sit: Independent with assistive device Stand Pivot Transfers: Independent with assistive device Locomotion  Gait Ambulation: Yes Gait Assistance: Independent with assistive device Gait Distance (Feet): 150 Feet Assistive device: Rollator Gait Gait: Yes Gait Pattern: Impaired Gait Pattern: Poor foot clearance - right;Decreased stance time - right;Decreased weight shift to right Stairs / Additional Locomotion Stairs: Yes Stairs Assistance: Supervision/Verbal cueing Stair Management Technique: One rail Left;Forwards;Step to pattern Number of Stairs: 12 Height of Stairs: 3 Ramp: Supervision/Verbal cueing Curb: Supervision/Verbal cueing Wheelchair Mobility Wheelchair Mobility: No  Trunk/Postural Assessment  Cervical Assessment Cervical Assessment: Within Functional Limits Thoracic Assessment Thoracic Assessment: Within Functional Limits Lumbar Assessment Lumbar Assessment: Within Functional Limits Postural Control Postural Control: Within Functional Limits  Balance Balance Balance Assessed: Yes Standardized Balance Assessment Standardized Balance Assessment: Timed Up and Go Test Timed Up and Go Test Normal TUG (seconds): 23(rollator, modI) Static Sitting Balance Static Sitting - Balance Support: No upper extremity supported;Feet supported Static Sitting - Level of Assistance: 7: Independent Dynamic Sitting Balance Dynamic Sitting - Balance Support: No upper extremity supported;Feet supported Dynamic Sitting - Level of Assistance: 7: Independent Static Standing  Balance Static Standing - Balance Support: During functional activity Static Standing - Level of Assistance: 6: Modified independent (Device/Increase time) Dynamic Standing Balance Dynamic Standing - Balance Support: During functional activity;Right upper extremity supported;Left upper extremity supported Dynamic Standing - Level of Assistance: 6: Modified independent (Device/Increase time) Extremity Assessment  RUE Assessment RUE Assessment: Within Functional Limits LUE Assessment LUE Assessment: Within Functional Limits RLE Assessment RLE Assessment: Exceptions to WFL General Strength Comments: hip flexion   4-/5, knee extension 4/5, knee flexion 4+/5, ankle dorsiflexion/plantarflexion 5/5 LLE Assessment LLE Assessment: Within Functional Limits  Skilled Therapeutic Intervention: Pt received seated in rollator seat; denies pain and agreeable to treatment. Assessed all mobility as above with modI overall using rollator, S for stairs. TUG demonstrated significant progress from eval score of 104 sec with RW and minA. Engaged in standing wii bowling/tennis for focus on standing endurance, dynamic balance; able to tolerate standing approx 10 min before limited by pain. Returned to room with gait as above. Pt's son present; reviewed recommendation for S on stairs; pt and son agreeable. No further questions/concerns regarding d/c home at this time from pt or son. Remained in rollator seat at end of session, all needs in reach.    [name] 10/22/2018, 10:11 AM  

## 2018-10-22 NOTE — Progress Notes (Signed)
Social Work Patient ID: [name], female   DOB: Dec 17, 1945, 72 y.o.   MRN: [id number]   CSW met with pt on 10-16-18 to update her on team conference discussion and targeted d/c date of 10-23-18. She was pleased with the date and is thinking of going to her dtr's home near [place], [place] to recover for a few weeks.  Pt will let CSW know of her decision.  Then, CSW received a phone call from pt on 10-18-18 about some confusion with her d/c date, even though CSW wrote 10-23-18 on her board in her room.  She was also told Tuesday (12-10) was her d/c date, so her son took the day off and was coming to pick her up, stay with her on Tuesday night, and then drive her to her dtr's home.  CSW and pt and MD discussed this and came up with the plan of d/c on Tuesday, December 10th after therapies.  Everyone was agreeable.  CSW will make d/c plans and continue to follow and assist as needed.

## 2018-10-23 NOTE — Discharge Summary (Addendum)
Physician Discharge Summary  [name] UJW:[id number] DOB: 11/28/1945 DOA: 10/08/2018  PCP: Gordan PaymentGrisso, Greg A., MD  Admit date: 10/08/2018 Discharge date: 10/14/2018  Time spent: 35 minutes  Recommendations for Outpatient Follow-up:  1. PCP in 1 week 2. Ortho dr.Handy in 2 weeks  Discharge Diagnoses:  Principal Problem:   Hip fracture requiring operative repair, right, closed, initial encounter [facility](HCC) Active Problems:   Essential hypertension   History of Clostridium difficile infection   Medication intolerance   Generalized osteoarthritis   Steroid-induced hyperglycemia   Postoperative pain   Leukocytosis   Fracture   Hyperglycemia   Urinary frequency   Thrombocytopenia (HCC)   Drug induced constipation   Acute blood loss anemia   Metabolic bone disease   Post-operative pain   Discharge Condition: stable  Diet recommendation: heart healthy  Filed Weights   10/09/18 0841  Weight: 78 kg    History of present illness:  Alicia SprayAnn B Kennedyis a 72 y.o.femalewith medical history significant ofremoteHTNand C diff colitispresented with a mechanical fall.She was in the kitchen last night. She turned and her foot caught and got tangled and it caused her to fall. She landed directly on her right hip  Hospital Course:  R hip fracture -Mechanical fall resulting inacute nondisplaced subcapital fracture of the right femoral neck with slight impaction -Orthopedics consulted, s/p Hemiarthroplasty 11/26 -Continue Lovenox for DVT prophylaxis for 4weeks total -PT/OT evaluation completed, CIR recommended -Appreciate rehab MD consult, CIr for rehab at discharge  H/o C diff -Treated with PO Vanc x 1 and then repeat course with taper -No symptoms of recurrence at this time  Urinary retention -Perioperative anesthesia and narcotics -Resolved, Foley catheter discontinued  HTN -Stable, monitor  Medication intolerance -Patient with MANY repeated drug  allergies/intolerances   Procedure: R hip hemiarthroplasty 11/26  Discharge Exam: Vitals:   10/14/18 0449 10/14/18 1404  BP: (!) 151/55 (!) 144/66  Pulse: 70 83  Resp: 18 18  Temp: (!) 97.5 F (36.4 C) 97.7 F (36.5 C)  SpO2: 98% 98%    General: AAOx3 Cardiovascular: S1S2/RRR Respiratory: CTAB  Discharge Instructions    Allergies as of 10/14/2018      Reactions   Estrogenic Substance Shortness Of Breath   Estrogen cream. Unable to breath per patient   Influenza Vaccines Anaphylaxis   Levofloxacin Other (See Comments)   Any fluoroquinolone drugs give her tendonitis Other Reaction: tendonitis   Tetracycline Shortness Of Breath   Amoxicillin-pot Clavulanate Other (See Comments), Nausea Only, Nausea And Vomiting   Aspirin Other (See Comments)   Other Reaction: GI Upset   Penicillins Other (See Comments), Nausea Only, Nausea And Vomiting   Prednisone Other (See Comments), Nausea Only   "I just throw prednisone back up, but Kenalog has never bothered me"   Sulfa Antibiotics Nausea Only, Nausea And Vomiting   Cefaclor Other (See Comments)   Cefdinir Diarrhea   Cephalexin Other (See Comments)   Ciprofloxacin Other (See Comments)   Meloxicam Swelling   Tongue swelling   Metronidazole Other (See Comments)   Severe headache   Other Other (See Comments)   Any Floxin Drugs - tendonitis    Solifenacin Other (See Comments)   Unable to void, had to be catheterized   Gentamicin Rash      Medication List    ASK your doctor about these medications   lansoprazole 30 MG capsule Commonly known as:  PREVACID Take 30 mg by mouth every morning.      Allergies  Allergen Reactions  .  Estrogenic Substance Shortness Of Breath    Estrogen cream. Unable to breath per patient  . Influenza Vaccines Anaphylaxis  . Levofloxacin Other (See Comments)    Any fluoroquinolone drugs give her tendonitis Other Reaction: tendonitis   . Tetracycline Shortness Of Breath  .  Amoxicillin-Pot Clavulanate Other (See Comments), Nausea Only and Nausea And Vomiting  . Aspirin Other (See Comments)    Other Reaction: GI Upset   . Penicillins Other (See Comments), Nausea Only and Nausea And Vomiting  . Prednisone Other (See Comments) and Nausea Only    "I just throw prednisone back up, but Kenalog has never bothered me"   . Sulfa Antibiotics Nausea Only and Nausea And Vomiting  . Cefaclor Other (See Comments)  . Cefdinir Diarrhea  . Cephalexin Other (See Comments)  . Ciprofloxacin Other (See Comments)  . Meloxicam Swelling    Tongue swelling  . Metronidazole Other (See Comments)    Severe headache  . Other Other (See Comments)    Any Floxin Drugs - tendonitis   . Solifenacin Other (See Comments)    Unable to void, had to be catheterized  . Gentamicin Rash      The results of significant diagnostics from this hospitalization (including imaging, microbiology, ancillary and laboratory) are listed below for reference.    Significant Diagnostic Studies: Dg Chest Portable 1 View  Result Date: 10/08/2018 CLINICAL DATA:  Preoperative examination prior to hip surgery. No current chest complaints. History of hypertension, never smoked. EXAM: PORTABLE CHEST 1 VIEW COMPARISON:  Portable chest x-ray of October 07, 2018 FINDINGS: The lungs are adequately inflated and clear. The heart and pulmonary vascularity are normal. The mediastinum is normal in width. The trachea is midline. The bony thorax exhibits no acute abnormality. IMPRESSION: There is no active cardiopulmonary disease. Electronically Signed   By: David  [place] M.D.   On: 10/08/2018 12:03   Dg Hip Port Unilat With Pelvis 1v Right  Result Date: 10/09/2018 CLINICAL DATA:  Postop total right hip replacement. EXAM: DG HIP (WITH OR WITHOUT PELVIS) 1V PORT RIGHT COMPARISON:  Preoperative radiograph yesterday. FINDINGS: Unipolar right hip arthroplasty in expected alignment. No periprosthetic lucency or fracture.  Recent postsurgical change includes air in soft tissue edema in the subcutaneous tissues. IMPRESSION: Right hip arthroplasty without immediate postoperative complication. Electronically Signed   By: Narda Rutherford M.D.   On: 10/09/2018 01:51    Microbiology: Recent Results (from the past 240 hour(s))  Culture, Urine     Status: Abnormal   Collection Time: 10/14/18  9:29 AM  Result Value Ref Range Status   Specimen Description URINE, RANDOM  Final   Special Requests   Final    NONE Performed at [facility], [address], [place], [place] [address]    Culture 40,000 COLONIES/mL ESCHERICHIA COLI (A)  Final   Report Status 10/16/2018 FINAL  Final   Organism ID, Bacteria ESCHERICHIA COLI (A)  Final      Susceptibility   Escherichia coli - MIC*    AMPICILLIN <=2 SENSITIVE Sensitive     CEFAZOLIN <=4 SENSITIVE Sensitive     CEFTRIAXONE <=1 SENSITIVE Sensitive     CIPROFLOXACIN <=0.25 SENSITIVE Sensitive     GENTAMICIN <=1 SENSITIVE Sensitive     IMIPENEM <=0.25 SENSITIVE Sensitive     NITROFURANTOIN <=16 SENSITIVE Sensitive     TRIMETH/SULFA <=20 SENSITIVE Sensitive     AMPICILLIN/SULBACTAM <=2 SENSITIVE Sensitive     PIP/TAZO <=4 SENSITIVE Sensitive     Extended ESBL NEGATIVE  Sensitive     * 40,000 COLONIES/mL ESCHERICHIA COLI     Labs: Basic Metabolic Panel: Recent Labs  Lab 10/21/18 0604  NA 140  K 4.4  CL 100  CO2 30  GLUCOSE 106*  BUN 18  CREATININE 0.90  CALCIUM 9.5   Liver Function Tests: No results for input(s): AST, ALT, ALKPHOS, BILITOT, PROT, ALBUMIN in the last 168 hours. No results for input(s): LIPASE, AMYLASE in the last 168 hours. No results for input(s): AMMONIA in the last 168 hours. CBC: Recent Labs  Lab 10/21/18 0604  WBC 7.7  HGB 12.0  HCT 41.2  MCV 91.4  PLT 247   Cardiac Enzymes: No results for input(s): CKTOTAL, CKMB, CKMBINDEX, TROPONINI in the last 168 hours. BNP: BNP (last 3 results) No results for input(s): BNP in the  last 8760 hours.  ProBNP (last 3 results) No results for input(s): PROBNP in the last 8760 hours.  CBG: No results for input(s): GLUCAP in the last 168 hours.     Signed:  [name] MD.  [facility] 10/23/2018, 4:20 PM

## 2018-10-25 NOTE — Discharge Summary (Addendum)
Physician Discharge Summary  Patient ID: [name] MRN: [id number] DOB/AGE: 72/07/1946 72 y.o.  Admit date: 10/14/2018 Discharge date: 10/22/2018  Discharge Diagnoses:  Principal Problem:   Hip fracture requiring operative repair [facility](HCC) Active Problems:   Urinary frequency   Drug induced constipation   Elevated blood pressure reading   Hypoalbuminemia due to protein-calorie malnutrition (HCC)   Noncompliance   Hypertension   Discharged Condition: stable   Significant Diagnostic Studies:   Labs:  Basic Metabolic Panel: BMP Latest Ref Rng & Units 10/21/2018 10/15/2018 10/12/2018  Glucose 70 - 99 mg/dL 409(W106(H) 119(J116(H) -  BUN 8 - 23 mg/dL 18 12 -  Creatinine 4.780.44 - 1.00 mg/dL 2.950.90 6.210.83 -  Sodium 308135 - 145 mmol/L 140 137 -  Potassium 3.5 - 5.1 mmol/L 4.4 4.1 -  Chloride 98 - 111 mmol/L 100 101 -  CO2 22 - 32 mmol/L 30 28 -  Calcium 8.9 - 10.3 mg/dL 9.5 8.9 8.7    CBC: CBC Latest Ref Rng & Units 10/21/2018 10/15/2018 10/12/2018  WBC 4.0 - 10.5 K/uL 7.7 6.5 6.8  Hemoglobin 12.0 - 15.0 g/dL 65.712.0 11.1(L) 10.6(L)  Hematocrit 36.0 - 46.0 % 41.2 35.5(L) 34.1(L)  Platelets 150 - 400 K/uL 247 158 PLATELET CLUMPS NOTED ON SMEAR, UNABLE TO ESTIMATE    CBG: No results for input(s): GLUCAP in the last 168 hours.  Brief HPI:   [name] is a 72 year old female with history of frequent UTI, recent bout with C. difficile colitis; who tripped and fell onto her right hip with onset of pain and difficulty with mobility.  She was admitted via outside hospital on 11/20 6-20 19 with right hip fracture and underwent right unipolar hip arthroplasty by Dr. Marcello FennelHande the same day.  Postop to be weightbearing as tolerated with recommendations to continue 30-day Lovenox for DVT prophylaxis.  Lab work showed evidence of metabolic bone disease with low vitamin D levels and supplements added.  Therapy initiated and she continues to limited by issues with nausea, acute blood loss anemia, frequency as well as  previously [facility]IS recommended for follow-up   Hospital Course: [name] was admitted to rehab 10/14/2018 for inpatient therapies to consist of PT and OT at least three hours five days a week. Past admission physiatrist, therapy team and rehab RN have worked together to provide customized collaborative inpatient rehab.  Pain control is improved and she was instructed on weaning off of hydrocodone past to have complaints of frequency.  Urine culture done showed 40,000 colonies of E. coli.  Patient without fever without leukocytosis and recent episode of C. difficile colitis, fluid and peritoneal.  Voiding function was monitored with PVR checks and no signs of retention noted.  Frequency was limited to at bedtime therefore Enablex was added to help with symptoms.  She has been educated in Lovenox administration to continue this to complete 30-day DVT prophylaxis course.  Bowel program has been augmented to help manage constipation.   Protein supplements were added to help with low calorie malnutrition however follow-up CBC shows acute blood loss anemia has resolved and platelets are stable.  BMET showed that lytes and renal status are WNL. Her hip incision has been healing well without any signs or symptoms of infection.  Pressures were monitored on a twice daily basis and were noted to be elevated.  Low-dose lisinopril was added for BP control however patient continued to refuse this during his stay.  He was agreeable to start low-dose discharge later continue  to seek further follow-up home health therapy she has made steady progress during her rehab stay and is at modified independent level.  She will continue to receive further follow-up home health PT by [facility]   Rehab course: During patient's stay in rehab team conference was held to monitor patient's progress, set goals and discuss barriers to discharge. At admission, patient required mod assist with basic ADL tasks and  min assist with mobility.  She  has had improvement in activity tolerance, balance, postural control as well as ability to compensate for deficits. She is able to complete ADL tasks at modified independent level.  She is modified independent for transfers and to ambulate 150 feet with rolling walker.  Disposition: Home  Diet: Regular  Special Instructions: 1.  No driving or strenuous activity until cleared by MD  Allergies as of 10/22/2018      Reactions   Estrogenic Substance Shortness Of Breath   Estrogen cream. Unable to breath per patient   Influenza Vaccines Anaphylaxis   Levofloxacin Other (See Comments)   Any fluoroquinolone drugs give her tendonitis Other Reaction: tendonitis   Tetracycline Shortness Of Breath   Amoxicillin-pot Clavulanate Other (See Comments), Nausea Only, Nausea And Vomiting   Aspirin Other (See Comments)   Other Reaction: GI Upset   Penicillins Other (See Comments), Nausea Only, Nausea And Vomiting   Prednisone Other (See Comments), Nausea Only   "I just throw prednisone back up, but Kenalog has never bothered me"   Sulfa Antibiotics Nausea Only, Nausea And Vomiting   Cefaclor Other (See Comments)   Cefdinir Diarrhea   Cephalexin Other (See Comments)   Ciprofloxacin Other (See Comments)   Meloxicam Swelling   Tongue swelling   Metronidazole Other (See Comments)   Severe headache   Other Other (See Comments)   Any Floxin Drugs - tendonitis    Solifenacin Other (See Comments)   Unable to void, had to be catheterized   Gentamicin Rash      Medication List    STOP taking these medications   ibuprofen 200 MG tablet Commonly known as:  ADVIL,MOTRIN     TAKE these medications   acetaminophen 325 MG tablet Commonly known as:  TYLENOL Take 1-2 tablets (325-650 mg total) by mouth every 4 (four) hours as needed for mild pain. What changed:    medication strength  how much to take  when to take this   calcium carbonate 500 MG chewable  tablet Commonly known as:  TUMS - dosed in mg elemental calcium Chew 2 tablets (400 mg of elemental calcium total) by mouth 2 (two) times daily with a meal.   cholecalciferol 10 MCG/ML Liqd Commonly known as:  D-VI-SOL Take 5 mLs (2,000 Units total) by mouth daily.   darifenacin 15 MG 24 hr tablet Commonly known as:  ENABLEX Take 1 tablet (15 mg total) by mouth daily.   diclofenac sodium 1 % Gel Commonly known as:  VOLTAREN Apply 2 g topically 4 (four) times daily.   enoxaparin 40 MG/0.4ML injection Commonly known as:  LOVENOX Inject 0.4 mLs (40 mg total) into the skin daily.   HYDROcodone-acetaminophen 7.5-325 MG tablet--Rx # 28 pills Commonly known as:  NORCO Take 1 tablet by mouth every 6 (six) hours as needed for severe pain. Notes to patient:  Limit to one pill twice a day    lansoprazole 30 MG capsule Commonly known as:  PREVACID Take 30 mg by mouth every morning.   methocarbamol 500  MG tablet Commonly known as:  ROBAXIN Take 1 tablet (500 mg total) by mouth every 6 (six) hours as needed for muscle spasms.   multivitamin with minerals Tabs tablet Take 1 tablet by mouth daily.   saccharomyces boulardii 250 MG capsule Commonly known as:  FLORASTOR Take 1 capsule (250 mg total) by mouth 2 (two) times daily.   senna-docusate 8.6-50 MG tablet Commonly known as:  Senokot-S Take 2 tablets by mouth at bedtime.      Follow-up Information    Marcello Fennel, MD Follow up.   Specialty:  Physical Medicine and Rehabilitation Why:  Office will call you with follow up appointment Contact information: [address] [place] [address] [phone]        Myrene Galas, MD Follow up.   Specialty:  Orthopedic Surgery Why:  for follow up appointment Contact information: [address] [place] [address] [phone]        Gordan Payment., MD. Call.   Specialty:  Internal Medicine Why:  for a hospital follow up appointment when you get  back from your daughter's home. Contact information: [address] [place] [address] [phone]           Signed: Jacquelynn Cree 10/25/2018, 6:24 PM   Patient seen and examined by me on day of discharge. Maryla Morrow, MD, ABPMR

## 2018-11-11 ENCOUNTER — Other Ambulatory Visit: Payer: Self-pay | Admitting: Physician Assistant

## 2018-11-11 ENCOUNTER — Encounter: Payer: Medicare Other | Admitting: Registered Nurse

## 2018-11-11 DIAGNOSIS — R1010 Upper abdominal pain, unspecified: Secondary | ICD-10-CM

## 2018-11-11 DIAGNOSIS — R110 Nausea: Secondary | ICD-10-CM

## 2018-11-12 ENCOUNTER — Ambulatory Visit
Admission: RE | Admit: 2018-11-12 | Discharge: 2018-11-12 | Disposition: A | Discharge status: Home / Self Care | Payer: Medicare Other | Source: Ambulatory Visit | Attending: Physician Assistant | Admitting: Physician Assistant

## 2018-11-12 DIAGNOSIS — R110 Nausea: Secondary | ICD-10-CM

## 2018-11-12 DIAGNOSIS — R1010 Upper abdominal pain, unspecified: Secondary | ICD-10-CM

## 2018-12-16 DIAGNOSIS — R5381 Other malaise: Secondary | ICD-10-CM | POA: Insufficient documentation

## 2018-12-16 DIAGNOSIS — Z8781 Personal history of (healed) traumatic fracture: Secondary | ICD-10-CM

## 2018-12-16 HISTORY — DX: Personal history of (healed) traumatic fracture: Z87.81

## 2018-12-16 HISTORY — DX: Other malaise: R53.81

## 2019-01-08 ENCOUNTER — Other Ambulatory Visit: Payer: Self-pay | Admitting: Orthopedic Surgery

## 2019-01-08 DIAGNOSIS — R600 Localized edema: Secondary | ICD-10-CM

## 2019-01-08 DIAGNOSIS — R52 Pain, unspecified: Secondary | ICD-10-CM

## 2019-01-09 ENCOUNTER — Ambulatory Visit
Admission: RE | Admit: 2019-01-09 | Discharge: 2019-01-09 | Disposition: A | Discharge status: Home / Self Care | Payer: Medicare Other | Source: Ambulatory Visit | Attending: Orthopedic Surgery | Admitting: Orthopedic Surgery

## 2019-01-09 DIAGNOSIS — R52 Pain, unspecified: Secondary | ICD-10-CM

## 2019-01-09 DIAGNOSIS — R600 Localized edema: Secondary | ICD-10-CM

## 2019-05-09 ENCOUNTER — Other Ambulatory Visit: Payer: Self-pay | Admitting: Orthopedic Surgery

## 2019-05-09 DIAGNOSIS — M1611 Unilateral primary osteoarthritis, right hip: Secondary | ICD-10-CM

## 2019-05-26 ENCOUNTER — Ambulatory Visit
Admission: RE | Admit: 2019-05-26 | Discharge: 2019-05-26 | Disposition: A | Discharge status: Home / Self Care | Payer: Medicare Other | Source: Ambulatory Visit | Attending: Orthopedic Surgery | Admitting: Orthopedic Surgery

## 2019-05-26 DIAGNOSIS — M1611 Unilateral primary osteoarthritis, right hip: Secondary | ICD-10-CM

## 2019-05-26 MED ORDER — IOPAMIDOL (ISOVUE-M 200) INJECTION 41%
1.0000 mL | Freq: Once | INTRAMUSCULAR | Status: AC
  Administered 2019-05-26: 12:00:00 1 mL via INTRA_ARTICULAR

## 2019-05-26 MED ORDER — METHYLPREDNISOLONE ACETATE 40 MG/ML INJ SUSP (RADIOLOG
120.0000 mg | Freq: Once | INTRAMUSCULAR | Status: AC
  Administered 2019-05-26: 12:00:00 120 mg via INTRA_ARTICULAR

## 2019-07-14 DIAGNOSIS — Z96641 Presence of right artificial hip joint: Secondary | ICD-10-CM

## 2019-07-14 HISTORY — DX: Presence of right artificial hip joint: Z96.641

## 2019-08-18 DIAGNOSIS — B351 Tinea unguium: Secondary | ICD-10-CM

## 2019-08-18 HISTORY — DX: Tinea unguium: B35.1

## 2019-10-13 DIAGNOSIS — H2513 Age-related nuclear cataract, bilateral: Secondary | ICD-10-CM

## 2019-10-13 HISTORY — DX: Age-related nuclear cataract, bilateral: H25.13

## 2021-07-18 DIAGNOSIS — T849XXA Unspecified complication of internal orthopedic prosthetic device, implant and graft, initial encounter: Secondary | ICD-10-CM | POA: Insufficient documentation

## 2021-07-18 HISTORY — DX: Unspecified complication of internal orthopedic prosthetic device, implant and graft, initial encounter: T84.9XXA

## 2021-10-12 DIAGNOSIS — K5730 Diverticulosis of large intestine without perforation or abscess without bleeding: Secondary | ICD-10-CM

## 2021-10-12 HISTORY — DX: Diverticulosis of large intestine without perforation or abscess without bleeding: K57.30

## 2021-12-02 ENCOUNTER — Other Ambulatory Visit: Payer: Self-pay | Admitting: Gastroenterology

## 2021-12-02 ENCOUNTER — Ambulatory Visit
Admission: RE | Admit: 2021-12-02 | Discharge: 2021-12-02 | Disposition: A | Discharge status: Home / Self Care | Payer: Medicare Other | Source: Ambulatory Visit | Attending: Gastroenterology | Admitting: Gastroenterology

## 2021-12-02 ENCOUNTER — Other Ambulatory Visit: Payer: Self-pay

## 2021-12-02 DIAGNOSIS — K5900 Constipation, unspecified: Secondary | ICD-10-CM

## 2021-12-04 DIAGNOSIS — M7061 Trochanteric bursitis, right hip: Secondary | ICD-10-CM

## 2021-12-04 HISTORY — DX: Trochanteric bursitis, right hip: M70.61

## 2022-04-17 ENCOUNTER — Other Ambulatory Visit: Payer: Self-pay | Admitting: Specialist

## 2022-04-17 DIAGNOSIS — M6281 Muscle weakness (generalized): Secondary | ICD-10-CM

## 2022-04-30 ENCOUNTER — Ambulatory Visit
Admission: RE | Admit: 2022-04-30 | Discharge: 2022-04-30 | Disposition: A | Discharge status: Home / Self Care | Payer: Medicare Other | Source: Ambulatory Visit | Attending: Specialist | Admitting: Specialist

## 2022-04-30 DIAGNOSIS — M6281 Muscle weakness (generalized): Secondary | ICD-10-CM

## 2022-04-30 MED ORDER — GADOBENATE DIMEGLUMINE 529 MG/ML IV SOLN
12.0000 mL | Freq: Once | INTRAVENOUS | Status: AC | PRN
  Administered 2022-04-30: 12 mL via INTRAVENOUS

## 2022-05-01 ENCOUNTER — Other Ambulatory Visit: Payer: Self-pay | Admitting: Specialist

## 2022-05-01 DIAGNOSIS — M6281 Muscle weakness (generalized): Secondary | ICD-10-CM

## 2022-05-01 DIAGNOSIS — R269 Unspecified abnormalities of gait and mobility: Secondary | ICD-10-CM

## 2022-05-18 ENCOUNTER — Ambulatory Visit
Admission: RE | Admit: 2022-05-18 | Discharge: 2022-05-18 | Disposition: A | Discharge status: Home / Self Care | Payer: Medicare Other | Source: Ambulatory Visit | Attending: Specialist | Admitting: Specialist

## 2022-05-18 DIAGNOSIS — R269 Unspecified abnormalities of gait and mobility: Secondary | ICD-10-CM

## 2022-05-18 DIAGNOSIS — M6281 Muscle weakness (generalized): Secondary | ICD-10-CM

## 2022-06-07 ENCOUNTER — Encounter: Payer: Self-pay | Admitting: *Deleted

## 2022-06-12 ENCOUNTER — Ambulatory Visit (INDEPENDENT_AMBULATORY_CARE_PROVIDER_SITE_OTHER): Payer: Medicare Other | Admitting: Diagnostic Neuroimaging

## 2022-06-12 ENCOUNTER — Encounter: Payer: Self-pay | Admitting: Diagnostic Neuroimaging

## 2022-06-12 VITALS — BP 150/82 | HR 84 | Ht 64.0 in | Wt 132.0 lb

## 2022-06-12 DIAGNOSIS — R252 Cramp and spasm: Secondary | ICD-10-CM

## 2022-06-12 DIAGNOSIS — R251 Tremor, unspecified: Secondary | ICD-10-CM

## 2022-06-12 NOTE — Progress Notes (Signed)
[facility]  PATIENT: [name] DOB: 1946-01-30  REFERRING CLINICIAN: Brown-Patram, Melissa J* HISTORY FROM: patient  REASON FOR VISIT: new consult    HISTORICAL  CHIEF COMPLAINT:  Chief Complaint  Patient presents with   Extremity Weakness    RM 7 alone  Pt is well, has been having weakness in all extremities since Jan, has progressively worsen.     HISTORY OF PRESENT ILLNESS:   76 year old female here for evaluation of constellation of problems including gait and balance difficulty, dizziness, weight loss, depression, malaise.  Symptoms started beginning of January 2023.  Noticed that she was sliding her feet, felt off balance, falling down.  Over time she started to lose weight in spite of eating adequately.  However patient and son think she is not eating well.  Has had some issues with enteritis and diarrhea problems.  Has had some generalized malaise, weakness and dizziness.   REVIEW OF SYSTEMS: Full 14 system review of systems performed and negative with exception of: as per HPI.  ALLERGIES: Allergies  Allergen Reactions   Estrogenic Substance Shortness Of Breath    Estrogen cream. Unable to breath per patient   Influenza Vaccines Anaphylaxis   Levofloxacin Other (See Comments)    Any fluoroquinolone drugs give her tendonitis Other Reaction: tendonitis    Tetracycline Shortness Of Breath   Amoxicillin-Pot Clavulanate Other (See Comments), Nausea Only and Nausea And Vomiting   Aspirin Other (See Comments)    Other Reaction: GI Upset    Penicillins Other (See Comments), Nausea Only and Nausea And Vomiting   Prednisone Other (See Comments) and Nausea Only    "I just throw prednisone back up, but Kenalog has never bothered me"    Sulfa Antibiotics Nausea Only and Nausea And Vomiting   Cefaclor Other (See Comments)    GI upset   Cefdinir Diarrhea   Cephalexin Other (See Comments)   Ciprofloxacin Other (See Comments)    GI upset    Meloxicam Swelling    Tongue swelling   Metronidazole Other (See Comments)    Severe headache   Other Other (See Comments)    Any Floxin Drugs - tendonitis    Solifenacin Other (See Comments)    Unable to void, had to be catheterized   Gentamicin Rash    HOME MEDICATIONS: Outpatient Medications Prior to Visit  Medication Sig Dispense Refill   acetaminophen (TYLENOL) 325 MG tablet Take 1-2 tablets (325-650 mg total) by mouth every 4 (four) hours as needed for mild pain.     calcium carbonate (TUMS - DOSED IN MG ELEMENTAL CALCIUM) 500 MG chewable tablet Chew 2 tablets (400 mg of elemental calcium total) by mouth 2 (two) times daily with a meal.     cholecalciferol (D-VI-SOL) 10 MCG/ML LIQD Take 5 mLs (2,000 Units total) by mouth daily. 150 mL 1   diclofenac sodium (VOLTAREN) 1 % GEL Apply 2 g topically 4 (four) times daily. 4 Tube 0   HYDROcodone-acetaminophen (NORCO) 7.5-325 MG tablet Take 1 tablet by mouth every 6 (six) hours as needed for severe pain. 28 tablet 0   lansoprazole (PREVACID) 30 MG capsule Take 30 mg by mouth every morning.  11   loperamide (IMODIUM) 2 MG capsule Take by mouth as needed for diarrhea or loose stools.     meclizine (ANTIVERT) 25 MG tablet Take 25 mg by mouth 3 (three) times daily.     Multiple Vitamin (MULTIVITAMIN WITH MINERALS) TABS tablet Take 1 tablet by mouth daily.  darifenacin (ENABLEX) 15 MG 24 hr tablet Take 1 tablet (15 mg total) by mouth daily. (Patient not taking: Reported on 06/12/2022) 30 tablet 0   enoxaparin (LOVENOX) 40 MG/0.4ML injection Inject 0.4 mLs (40 mg total) into the skin daily. (Patient not taking: Reported on 06/12/2022) 16 Syringe 0   methocarbamol (ROBAXIN) 500 MG tablet Take 1 tablet (500 mg total) by mouth every 6 (six) hours as needed for muscle spasms. (Patient not taking: Reported on 06/12/2022) 90 tablet 0   saccharomyces boulardii (FLORASTOR) 250 MG capsule Take 1 capsule (250 mg total) by mouth 2 (two) times daily. (Patient  not taking: Reported on 06/12/2022)     senna-docusate (SENOKOT-S) 8.6-50 MG tablet Take 2 tablets by mouth at bedtime. (Patient not taking: Reported on 06/12/2022) 60 tablet 0   No facility-administered medications prior to visit.    PAST MEDICAL HISTORY: Past Medical History:  Diagnosis Date   Arthritis    oa   Cataract    Complication of anesthesia    DDD (degenerative disc disease), cervical    Frequent UTI    GERD (gastroesophageal reflux disease)    Glaucoma    History of Clostridium difficile infection    Hyperlipidemia    Osteoarthritis    PONV (postoperative nausea and vomiting)     PAST SURGICAL HISTORY: Past Surgical History:  Procedure Laterality Date   APPENDECTOMY     BLADDER SUSPENSION     CERVICAL DISC SURGERY     arthroplasty C3-4   CERVICAL FUSION     3 4 & 5    CHOLECYSTECTOMY     HIP ARTHROPLASTY Right 10/08/2018   Procedure: ARTHROPLASTY BIPOLAR HIP (HEMIARTHROPLASTY);  Surgeon: Myrene Galas, MD;  Location: [facility] OR;  Service: Orthopedics;  Laterality: Right;   KNEE SURGERY     OVARIAN CYST REMOVAL     SHOULDER SURGERY     TONSILLECTOMY      FAMILY HISTORY: Family History  Problem Relation Age of Onset   Hypertension Mother    Cancer Mother    Cancer Father    Hypertension Father    Stroke Father    Heart disease Father    High blood pressure Father    Atrial fibrillation Father    Hypertension Brother    High blood pressure Brother     SOCIAL HISTORY: Social History   Socioeconomic History   Marital status: Widowed    Spouse name: Not on file   Number of children: Not on file   Years of education: Not on file   Highest education level: Not on file  Occupational History   Occupation: retired  Tobacco Use   Smoking status: Never   Smokeless tobacco: Never  Vaping Use   Vaping Use: Never used  Substance and Sexual Activity   Alcohol use: Never   Drug use: Never   Sexual activity: Not on file  Other Topics Concern   Not on  file  Social History Narrative   Not on file   Social Determinants of Health   Financial Resource Strain: Not on file  Food Insecurity: Not on file  Transportation Needs: Not on file  Physical Activity: Not on file  Stress: Not on file  Social Connections: Not on file  Intimate Partner Violence: Not on file     PHYSICAL EXAM  GENERAL EXAM/CONSTITUTIONAL: Vitals:  Vitals:   06/12/22 1107  BP: (!) 150/82  Pulse: 84  Weight: 132 lb (59.9 kg)  Height: 5\' 4"  (1.626 m)  Body mass index is 22.66 kg/m. Wt Readings from Last 3 Encounters:  06/12/22 132 lb (59.9 kg)  10/14/18 167 lb 8.8 oz (76 kg)  10/09/18 172 lb (78 kg)   Patient is in no distress; well developed, nourished and groomed; neck is supple  CARDIOVASCULAR: Examination of carotid arteries is normal; no carotid bruits Regular rate and rhythm, no murmurs Examination of peripheral vascular system by observation and palpation is normal  EYES: Ophthalmoscopic exam of optic discs and posterior segments is normal; no papilledema or hemorrhages No results found.  MUSCULOSKELETAL: Gait, strength, tone, movements noted in Neurologic exam below  NEUROLOGIC: MENTAL STATUS:      No data to display         awake, alert, oriented to person, place and time recent and remote memory intact normal attention and concentration language fluent, comprehension intact, naming intact fund of knowledge appropriate  CRANIAL NERVE:  2nd - no papilledema on fundoscopic exam 2nd, 3rd, 4th, 6th - pupils equal and reactive to light, visual fields full to confrontation, extraocular muscles intact, no nystagmus 5th - facial sensation symmetric 7th - facial strength symmetric 8th - hearing intact 9th - palate elevates symmetrically, uvula midline 11th - shoulder shrug symmetric 12th - tongue protrusion midline SLIGHTLY SOFT, HOARSE VOICE  MOTOR:  normal bulk and tone, full strength in the BUE, BLE NO TREMOR; NO  RIGIDITY BRADYKINESIA IN BUE, BLE  SENSORY:  normal and symmetric to light touch, temperature, vibration  COORDINATION:  finger-nose-finger, fine finger movements normal  REFLEXES:  deep tendon reflexes 3+ IN BUE; 2+ IN BLE and symmetric  GAIT/STATION:  narrow based gait; SHORT STEPS; DECR ARM SWING; WALKING ON TOES     DIAGNOSTIC DATA (LABS, IMAGING, TESTING) - I reviewed patient records, labs, notes, testing and imaging myself where available.  Lab Results  Component Value Date   WBC 7.7 10/21/2018   HGB 12.0 10/21/2018   HCT 41.2 10/21/2018   MCV 91.4 10/21/2018   PLT 247 10/21/2018      Component Value Date/Time   NA 140 10/21/2018 0604   K 4.4 10/21/2018 0604   CL 100 10/21/2018 0604   CO2 30 10/21/2018 0604   GLUCOSE 106 (H) 10/21/2018 0604   BUN 18 10/21/2018 0604   CREATININE 0.90 10/21/2018 0604   CALCIUM 9.5 10/21/2018 0604   CALCIUM 8.7 10/12/2018 0338   PROT 6.2 (L) 10/15/2018 0443   ALBUMIN 2.9 (L) 10/15/2018 0443   AST 13 (L) 10/15/2018 0443   ALT 16 10/15/2018 0443   ALKPHOS 71 10/15/2018 0443   BILITOT 0.5 10/15/2018 0443   GFRNONAA >60 10/21/2018 0604   GFRAA >60 10/21/2018 0604   No results found for: "CHOL", "HDL", "LDLCALC", "LDLDIRECT", "TRIG", "CHOLHDL" Lab Results  Component Value Date   HGBA1C 5.2 10/15/2018   No results found for: "VITAMINB12" Lab Results  Component Value Date   TSH 2.802 10/12/2018     04/30/22 MRI brain [I reviewed images myself and agree with interpretation. -VRP]  1. Cerebellar atrophy. 2. Mild for age chronic small vessel ischemia. 3. No reversible finding.  05/18/22 MRI thoracic spine  - No significant spinal canal or neural foraminal stenosis. No abnormal cord signal. - Generalized paraspinal muscle atrophy.    ASSESSMENT AND PLAN  76 y.o. year old female here with:   Dx:  1. Tremor   2. Cramps of left lower extremity   3. Cramp and spasm       PLAN:  POSTURAL INSTABILITY / GAIT  DIFF  (? atypical parkinsonism PiGD, MSA-a vs MSA-p) - check MRI cervical spine (rule out myelopathy) - DATscan  WEIGHT LOSS (? Malabsorption vs decreased appetite) - follow up with GI and PCP  LIGHTHEADEDNESS (sitting or standing) - could be related to low BP, decreased PO intake, or neurodegenerative (PD vs MSA) - follow up with cardiology  Orders Placed This Encounter  Procedures   MR CERVICAL SPINE WO CONTRAST   NM BRAIN DATSCAN TUMOR LOC INFLAM SPECT 1 DAY   Return for pending if symptoms worsen or fail to improve, pending test results.  I spent 60 minutes of face-to-face and non-face-to-face time with patient.  This included previsit chart review, lab review, study review, order entry, electronic health record documentation, patient education.     Suanne Marker, MD 06/12/2022, 12:01 PM Certified in Neurology, Neurophysiology and Neuroimaging  [facility] [address], [address] [place], [place] [address] [phone]

## 2022-06-12 NOTE — Patient Instructions (Signed)
  GAIT DIFF / WEAKNESS - check MRI cervical spine - DATscan  WEIGHT LOSS (? Malabsorption vs decreased appetite) - follow up with GI and PCP  LIGHTHEADEDNESS (sitting or standing) - follow up with cardiology

## 2022-06-13 ENCOUNTER — Telehealth: Payer: Self-pay | Admitting: Diagnostic Neuroimaging

## 2022-06-13 NOTE — Telephone Encounter (Signed)
medicare [employer] Berkley Harvey: [id number] exp. 06/13/22-07/12/22 sent to [facility] nuclear medicine

## 2022-06-14 ENCOUNTER — Telehealth: Payer: Self-pay | Admitting: Diagnostic Neuroimaging

## 2022-06-14 NOTE — Telephone Encounter (Signed)
scheduled 8/7 545pm

## 2022-06-14 NOTE — Telephone Encounter (Signed)
medicare [employer] Berkley Harvey: [id number] exp. 06/13/22-07/12/22 sent to [facility]

## 2022-06-15 DIAGNOSIS — I83893 Varicose veins of bilateral lower extremities with other complications: Secondary | ICD-10-CM

## 2022-06-15 HISTORY — DX: Varicose veins of bilateral lower extremities with other complications: I83.893

## 2022-06-22 ENCOUNTER — Telehealth: Payer: Self-pay | Admitting: *Deleted

## 2022-06-22 NOTE — Telephone Encounter (Signed)
Received report from [facility], MRI cervical spine. Placed on MD desk for review.

## 2022-06-22 NOTE — Telephone Encounter (Signed)
LVM informing patient her MRI cervical spine results are unremarkable, no major findings. Reminded her the DAT scan is 06/27/22 and once Dr Marjory Lies gets those results we'll call her. Left # for questions.

## 2022-06-26 ENCOUNTER — Other Ambulatory Visit: Payer: Self-pay

## 2022-06-26 DIAGNOSIS — Z8616 Personal history of COVID-19: Secondary | ICD-10-CM | POA: Insufficient documentation

## 2022-06-26 DIAGNOSIS — E538 Deficiency of other specified B group vitamins: Secondary | ICD-10-CM | POA: Insufficient documentation

## 2022-06-26 DIAGNOSIS — H269 Unspecified cataract: Secondary | ICD-10-CM | POA: Insufficient documentation

## 2022-06-26 DIAGNOSIS — K649 Unspecified hemorrhoids: Secondary | ICD-10-CM | POA: Insufficient documentation

## 2022-06-26 DIAGNOSIS — R42 Dizziness and giddiness: Secondary | ICD-10-CM | POA: Insufficient documentation

## 2022-06-26 DIAGNOSIS — M1990 Unspecified osteoarthritis, unspecified site: Secondary | ICD-10-CM | POA: Insufficient documentation

## 2022-06-26 DIAGNOSIS — K219 Gastro-esophageal reflux disease without esophagitis: Secondary | ICD-10-CM | POA: Insufficient documentation

## 2022-06-26 DIAGNOSIS — R198 Other specified symptoms and signs involving the digestive system and abdomen: Secondary | ICD-10-CM | POA: Insufficient documentation

## 2022-06-26 DIAGNOSIS — Z9889 Other specified postprocedural states: Secondary | ICD-10-CM | POA: Insufficient documentation

## 2022-06-26 DIAGNOSIS — K589 Irritable bowel syndrome without diarrhea: Secondary | ICD-10-CM

## 2022-06-26 DIAGNOSIS — M5030 Other cervical disc degeneration, unspecified cervical region: Secondary | ICD-10-CM | POA: Insufficient documentation

## 2022-06-26 DIAGNOSIS — F331 Major depressive disorder, recurrent, moderate: Secondary | ICD-10-CM | POA: Insufficient documentation

## 2022-06-26 DIAGNOSIS — G319 Degenerative disease of nervous system, unspecified: Secondary | ICD-10-CM | POA: Insufficient documentation

## 2022-06-26 DIAGNOSIS — H409 Unspecified glaucoma: Secondary | ICD-10-CM | POA: Insufficient documentation

## 2022-06-26 DIAGNOSIS — K5904 Chronic idiopathic constipation: Secondary | ICD-10-CM

## 2022-06-26 DIAGNOSIS — T8859XA Other complications of anesthesia, initial encounter: Secondary | ICD-10-CM | POA: Insufficient documentation

## 2022-06-26 DIAGNOSIS — B3731 Acute candidiasis of vulva and vagina: Secondary | ICD-10-CM | POA: Insufficient documentation

## 2022-06-26 DIAGNOSIS — M6281 Muscle weakness (generalized): Secondary | ICD-10-CM | POA: Insufficient documentation

## 2022-06-26 DIAGNOSIS — E782 Mixed hyperlipidemia: Secondary | ICD-10-CM | POA: Insufficient documentation

## 2022-06-26 DIAGNOSIS — R269 Unspecified abnormalities of gait and mobility: Secondary | ICD-10-CM | POA: Insufficient documentation

## 2022-06-26 DIAGNOSIS — H401121 Primary open-angle glaucoma, left eye, mild stage: Secondary | ICD-10-CM | POA: Insufficient documentation

## 2022-06-26 DIAGNOSIS — H401111 Primary open-angle glaucoma, right eye, mild stage: Secondary | ICD-10-CM | POA: Insufficient documentation

## 2022-06-26 DIAGNOSIS — E785 Hyperlipidemia, unspecified: Secondary | ICD-10-CM | POA: Insufficient documentation

## 2022-06-26 DIAGNOSIS — R55 Syncope and collapse: Secondary | ICD-10-CM | POA: Insufficient documentation

## 2022-06-26 DIAGNOSIS — Z1211 Encounter for screening for malignant neoplasm of colon: Secondary | ICD-10-CM | POA: Insufficient documentation

## 2022-06-26 DIAGNOSIS — R112 Nausea with vomiting, unspecified: Secondary | ICD-10-CM | POA: Insufficient documentation

## 2022-06-26 DIAGNOSIS — N3281 Overactive bladder: Secondary | ICD-10-CM | POA: Insufficient documentation

## 2022-06-26 DIAGNOSIS — M7730 Calcaneal spur, unspecified foot: Secondary | ICD-10-CM | POA: Insufficient documentation

## 2022-06-26 DIAGNOSIS — H2513 Age-related nuclear cataract, bilateral: Secondary | ICD-10-CM | POA: Insufficient documentation

## 2022-06-26 DIAGNOSIS — R1010 Upper abdominal pain, unspecified: Secondary | ICD-10-CM | POA: Insufficient documentation

## 2022-06-26 DIAGNOSIS — N390 Urinary tract infection, site not specified: Secondary | ICD-10-CM | POA: Insufficient documentation

## 2022-06-26 DIAGNOSIS — M810 Age-related osteoporosis without current pathological fracture: Secondary | ICD-10-CM

## 2022-06-26 HISTORY — DX: Chronic idiopathic constipation: K59.04

## 2022-06-26 HISTORY — DX: Irritable bowel syndrome, unspecified: K58.9

## 2022-06-26 HISTORY — DX: Acute candidiasis of vulva and vagina: B37.31

## 2022-06-26 HISTORY — DX: Other specified symptoms and signs involving the digestive system and abdomen: R19.8

## 2022-06-26 HISTORY — DX: Upper abdominal pain, unspecified: R10.10

## 2022-06-26 HISTORY — DX: Encounter for screening for malignant neoplasm of colon: Z12.11

## 2022-06-26 HISTORY — DX: Age-related osteoporosis without current pathological fracture: M81.0

## 2022-06-27 ENCOUNTER — Encounter (HOSPITAL_COMMUNITY)
Admission: RE | Admit: 2022-06-27 | Discharge: 2022-06-27 | Disposition: A | Discharge status: Home / Self Care | Payer: Medicare Other | Source: Ambulatory Visit | Attending: Diagnostic Neuroimaging | Admitting: Diagnostic Neuroimaging

## 2022-06-27 DIAGNOSIS — R251 Tremor, unspecified: Secondary | ICD-10-CM | POA: Diagnosis present

## 2022-06-27 DIAGNOSIS — R252 Cramp and spasm: Secondary | ICD-10-CM | POA: Diagnosis present

## 2022-06-27 MED ORDER — POTASSIUM IODIDE (ANTIDOTE) 130 MG PO TABS: 130.0000 mg | ORAL_TABLET | Freq: Once | ORAL | Status: DC

## 2022-06-27 MED ORDER — POTASSIUM IODIDE (ANTIDOTE) 130 MG PO TABS
ORAL_TABLET | ORAL | Status: AC
  Administered 2022-06-27: 130 mg via ORAL
  Filled 2022-06-27: qty 1

## 2022-06-27 MED ORDER — IOFLUPANE I 123 185 MBQ/2.5ML IV SOLN
4.4000 | Freq: Once | INTRAVENOUS | Status: AC | PRN
  Administered 2022-06-27: 4.4 via INTRAVENOUS
  Filled 2022-06-27: qty 5

## 2022-06-29 ENCOUNTER — Ambulatory Visit (INDEPENDENT_AMBULATORY_CARE_PROVIDER_SITE_OTHER): Payer: Medicare Other

## 2022-06-29 ENCOUNTER — Other Ambulatory Visit: Payer: Self-pay

## 2022-06-29 DIAGNOSIS — R42 Dizziness and giddiness: Secondary | ICD-10-CM

## 2022-06-29 DIAGNOSIS — R002 Palpitations: Secondary | ICD-10-CM | POA: Diagnosis not present

## 2022-06-29 DIAGNOSIS — R55 Syncope and collapse: Secondary | ICD-10-CM

## 2022-07-04 ENCOUNTER — Telehealth: Payer: Self-pay | Admitting: Diagnostic Neuroimaging

## 2022-07-04 ENCOUNTER — Telehealth: Payer: Self-pay

## 2022-07-04 NOTE — Telephone Encounter (Signed)
I called patient.  I discussed her DAT scan results and recommendations.  Patient would like an office visit with Dr. Marjory Lies to discuss the DaTscan results and medication trial.  She is scheduled an appointment for September 5th at 1:30pm.  Patient verbalized understanding of results and had no further questions or concerns at this time.

## 2022-07-04 NOTE — Telephone Encounter (Signed)
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 May consider trial of carb/levodopa. Can setup phone or video visit for further discuss.  -VRP

## 2022-07-04 NOTE — Telephone Encounter (Signed)
Pt is calling and requesting results from a test she had last week ( Pt was not sure of the name of the test). Pt is requesting a call back from the nurse.

## 2022-07-04 NOTE — Telephone Encounter (Signed)
error 

## 2022-07-14 ENCOUNTER — Ambulatory Visit: Payer: Medicare Other | Attending: Cardiology | Admitting: Cardiology

## 2022-07-14 ENCOUNTER — Encounter: Payer: Self-pay | Admitting: Cardiology

## 2022-07-14 VITALS — BP 124/84 | HR 91 | Ht 64.5 in | Wt 130.0 lb

## 2022-07-14 DIAGNOSIS — R002 Palpitations: Secondary | ICD-10-CM | POA: Insufficient documentation

## 2022-07-14 DIAGNOSIS — R011 Cardiac murmur, unspecified: Secondary | ICD-10-CM

## 2022-07-14 HISTORY — DX: Palpitations: R00.2

## 2022-07-14 HISTORY — DX: Cardiac murmur, unspecified: R01.1

## 2022-07-14 NOTE — Addendum Note (Signed)
Addended by: Heywood Bene on: 07/14/2022 03:01 PM   Modules accepted: Orders

## 2022-07-14 NOTE — Progress Notes (Signed)
Cardiology Office Note:    Date:  07/14/2022   ID:  [name], DOB 31-Jul-1946, MRN [id number]  PCP:  Gordan Payment., MD  Cardiologist:  Garwin Brothers, MD   Referring MD: Rhea Bleacher*    ASSESSMENT:    1. Murmur   2. Cardiac murmur   3. Palpitations    PLAN:    In order of problems listed above:  Primary prevention stressed to the patient.  Importance of compliance with diet medication stressed and she vocalized understanding. Palpitations: Her blood work is followed by primary.  We will follow the reports of the 2-week monitor when it is available. Cardiac murmur: Echocardiogram will be done to assess murmur heard on auscultation. Overall the patient has significant symptoms of remarkable fatigue that has essentially made her very dependent for activities of daily living.  I doubt this has any cardiovascular component.  The above tests will help [place] assess further if necessary. Patient will be seen in follow-up appointment in 6 months or earlier if the patient has any concerns    Medication Adjustments/Labs and Tests Ordered: Current medicines are reviewed at length with the patient today.  Concerns regarding medicines are outlined above.  Orders Placed This Encounter  Procedures   ECHOCARDIOGRAM COMPLETE   No orders of the defined types were placed in this encounter.    History of Present Illness:    [name] is a 76 y.o. female who is being seen today for the evaluation of generalized weakness and occasional palpitations at the request of Sylvan Cheese, Barbee Cough*.  Patient is a pleasant 76 year old female.  She has no significant past medical history from a cardiovascular standpoint.  Overall there is history of palpitations.  She has undergone monitoring and we are awaiting those results.  She denies any history of syncope chest pain orthopnea or PND.  For the symptoms and possibly tachycardia she is referred here.  At the time of my evaluation, the  patient is alert awake oriented and in no distress.  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   Drug induced constipation    Elevated blood pressure reading    Essential hypertension    Fracture    Frequent UTI    Gait abnormality    Generalized osteoarthritis    GERD (gastroesophageal reflux disease)    GERD without esophagitis    Glaucoma    Heel spur    Hemorrhoids    Hip fracture requiring operative repair (HCC) 10/14/2018   Hip fracture requiring operative repair, right, closed, initial encounter (HCC) 10/08/2018   History of Clostridium difficile infection    History of COVID-19    History of fracture of right hip 12/16/2018   History of hemiarthroplasty of right hip 07/14/2019   Hyperglycemia    Hyperlipidemia    Hypertension    Hypoalbuminemia due to protein-calorie malnutrition (HCC)    Irregular bowel habits 06/26/2022   Irritable bowel syndrome 06/26/2022   Leukocytosis    Malaise and fatigue 12/16/2018   Medication intolerance 10/08/2018   Metabolic  bone disease    Mixed hyperlipidemia    Moderate episode of recurrent major depressive disorder (HCC)    Muscle weakness    Noncompliance    Nuclear sclerotic cataract of both eyes    Osteoarthritis    Osteoporosis 06/26/2022   Polyosteoarthritis, 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   PONV (postoperative nausea and vomiting)    Post-operative pain    Postoperative pain    Postural dizziness with presyncope    Primary open angle glaucoma of left eye, mild stage    Primary open angle glaucoma of right eye, mild stage     Steroid-induced hyperglycemia    Symptomatic varicose veins, bilateral 06/15/2022   Thrombocytopenia (HCC)    Toenail fungus 08/18/2019   Trochanteric bursitis of right hip 12/04/2021   Upper abdominal pain 06/26/2022   Urinary frequency    Vertigo    Vitamin B12 deficiency     Past Surgical History:  Procedure Laterality Date   APPENDECTOMY     BLADDER SUSPENSION     CERVICAL DISC SURGERY     arthroplasty C3-4   CERVICAL FUSION     3 4 & 5    CHOLECYSTECTOMY     HIP ARTHROPLASTY Right 10/08/2018   Procedure: ARTHROPLASTY BIPOLAR HIP (HEMIARTHROPLASTY);  Surgeon: Altamese [place], MD;  Location: [facility];  Service: Orthopedics;  Laterality: Right;   KNEE SURGERY     OVARIAN CYST REMOVAL     SHOULDER SURGERY     TONSILLECTOMY      Current Medications: Current Meds  Medication Sig   acetaminophen (TYLENOL) 325 MG tablet Take 1-2 tablets (325-650 mg total) by mouth every 4 (four) hours as needed for mild pain.   calcium carbonate (TUMS - DOSED IN MG ELEMENTAL CALCIUM) 500 MG chewable tablet Chew 2 tablets (400 mg of elemental calcium total) by mouth 2 (two) times daily with a meal.   cholecalciferol (D-VI-SOL) 10 MCG/ML LIQD Take 5 mLs (2,000 Units total) by mouth daily.   Cyanocobalamin (B-12 COMPLIANCE INJECTION IJ) Inject 1 Dose as directed every 14 (fourteen) days.   diclofenac sodium (VOLTAREN) 1 % GEL Apply 2 g topically 4 (four) times daily.   lansoprazole (PREVACID) 30 MG capsule Take 30 mg by mouth every morning.   meclizine (ANTIVERT) 25 MG tablet Take 25 mg by mouth 3 (three) times daily.   Multiple Vitamin (MULTIVITAMIN WITH MINERALS) TABS tablet Take 1 tablet by mouth daily.     Allergies:   Estrogenic substance, Influenza vaccines, Levofloxacin, Tetracycline, Amoxicillin-pot clavulanate, Aspirin, Penicillins, Prednisone, Sulfa antibiotics, Cefaclor, Cefdinir, Cephalexin, Ciprofloxacin, Dexlansoprazole, Hydrocodone, Meloxicam, Metoclopramide, Metronidazole, Other, Oxycodone,  Oxycodone hcl, Solifenacin, Sulfamethoxazole, Sulfamethoxazole-trimethoprim, Tramadol, Travoprost, and Gentamicin   Social History   Socioeconomic History   Marital status: Widowed    Spouse name: Not on file   Number of children: Not on file   Years of education: Not on file   Highest education level: Not on file  Occupational History   Occupation: retired  Tobacco Use   Smoking status: Never   Smokeless tobacco: Never  Vaping Use   Vaping Use: Never used  Substance and Sexual Activity   Alcohol use: Never   Drug use: Never   Sexual activity: Not on file  Other Topics Concern   Not on file  Social History Narrative   Not on file   Social Determinants of Health   Financial Resource Strain: Not on file  Food Insecurity: Not on file  Transportation Needs: Not on file  Physical Activity: Not on file  Stress: Not on file  Social Connections: Not on file     Family History: The patient's family history includes Atrial fibrillation in her father; Cancer in her father and mother; Heart disease in her father; High blood pressure in her brother and father; Hypertension in her brother, father, and mother; Stroke in her father.  ROS:   Please see the history of present illness.    All other systems reviewed and are negative.  EKGs/Labs/Other Studies Reviewed:    The following studies were reviewed today: EKG reveals sinus rhythm and nonspecific ST-T changes   Recent Labs: No results found for requested labs within last 365 days.  Recent Lipid Panel No results found for: "CHOL", "TRIG", "HDL", "CHOLHDL", "VLDL", "LDLCALC", "LDLDIRECT"  Physical Exam:    VS:  BP 124/84 (BP Location: Left Arm, Patient Position: Sitting)   Pulse 91   Ht 5' 4.5" (1.638 m)   Wt 130 lb (59 kg)   SpO2 98%   BMI 21.97 kg/m     Wt Readings from Last 3 Encounters:  07/14/22 130 lb (59 kg)  06/12/22 132 lb (59.9 kg)  10/14/18 167 lb 8.8 oz (76 kg)     GEN: Patient is in no acute  distress HEENT: Normal NECK: No JVD; No carotid bruits LYMPHATICS: No lymphadenopathy CARDIAC: S1 S2 regular, 2/6 systolic murmur at the apex. RESPIRATORY:  Clear to auscultation without rales, wheezing or rhonchi  ABDOMEN: Soft, non-tender, non-distended MUSCULOSKELETAL:  No edema; No deformity  SKIN: Warm and dry NEUROLOGIC:  Alert and oriented x 3 PSYCHIATRIC:  Normal affect    Signed, Garwin Brothers, MD  07/14/2022 12:01 PM     [facility]

## 2022-07-14 NOTE — Patient Instructions (Signed)
Medication Instructions:  Your physician recommends that you continue on your current medications as directed. Please refer to the Current Medication list given to you today.  *If you need a refill on your cardiac medications before your next appointment, please call your pharmacy*   Lab Work: None Ordered If you have labs (blood work) drawn today and your tests are completely normal, you will receive your results only by: MyChart Message (if you have MyChart) OR A paper copy in the mail If you have any lab test that is abnormal or we need to change your treatment, we will call you to review the results.   Testing/Procedures: Your physician has requested that you have an echocardiogram. Echocardiography is a painless test that uses sound waves to create images of your heart. It provides your doctor with information about the size and shape of your heart and how well your heart's chambers and valves are working. This procedure takes approximately one hour. There are no restrictions for this procedure.    Follow-Up: At [facility], you and your health needs are our priority.  As part of our continuing mission to provide you with exceptional heart care, we have created designated Provider Care Teams.  These Care Teams include your primary Cardiologist (physician) and Advanced Practice Providers (APPs -  Physician Assistants and Nurse Practitioners) who all work together to provide you with the care you need, when you need it.  We recommend signing up for the patient portal called "MyChart".  Sign up information is provided on this After Visit Summary.  MyChart is used to connect with patients for Virtual Visits (Telemedicine).  Patients are able to view lab/test results, encounter notes, upcoming appointments, etc.  Non-urgent messages can be sent to your provider as well.   To learn more about what you can do with MyChart, go to [email or web address].    Your next appointment:   6  month(s)  The format for your next appointment:   In Person  Provider:   Rajan Revankar, MD    Other Instructions NA  

## 2022-07-18 ENCOUNTER — Encounter: Payer: Self-pay | Admitting: Diagnostic Neuroimaging

## 2022-07-18 ENCOUNTER — Ambulatory Visit (INDEPENDENT_AMBULATORY_CARE_PROVIDER_SITE_OTHER): Payer: Medicare Other | Admitting: Diagnostic Neuroimaging

## 2022-07-18 VITALS — BP 155/88 | HR 101 | Ht 64.0 in | Wt 130.0 lb

## 2022-07-18 DIAGNOSIS — G20 Parkinson's disease: Secondary | ICD-10-CM

## 2022-07-18 MED ORDER — CARBIDOPA-LEVODOPA 25-100 MG PO TABS: 1.0000 | ORAL_TABLET | Freq: Three times a day (TID) | ORAL | 6 refills | Status: AC

## 2022-07-18 NOTE — Progress Notes (Signed)
[facility]  PATIENT: [name] DOB: [birth year]  REFERRING CLINICIAN: Gordan Payment., MD HISTORY FROM: patient  REASON FOR VISIT: follow up   HISTORICAL  CHIEF COMPLAINT:  Chief Complaint  Patient presents with   Follow-up    Rm 7 with sister Para March  Pt is well, here to discuss dat scan result sand medications .    HISTORY OF PRESENT ILLNESS:   UPDATE (07/18/22, VRP): Since last visit, here for follow up. DATscan shows signs for parkinsonism. Gait issues continue.   PRIOR HPI (06/12/22): 76 year old female here for evaluation of constellation of problems including gait and balance difficulty, dizziness, weight loss, depression, malaise.  Symptoms started beginning of January 2023.  Noticed that she was sliding her feet, felt off balance, falling down.  Over time she started to lose weight in spite of eating adequately.  However patient and son think she is not eating well.  Has had some issues with enteritis and diarrhea problems.  Has had some generalized malaise, weakness and dizziness.   REVIEW OF SYSTEMS: Full 14 system review of systems performed and negative with exception of: as per HPI.  ALLERGIES: Allergies  Allergen Reactions   Estrogenic Substance Shortness Of Breath    Estrogen cream. Unable to breath per patient   Influenza Vaccines Anaphylaxis   Levofloxacin Other (See Comments)    Any fluoroquinolone drugs give her tendonitis Other Reaction: tendonitis    Tetracycline Shortness Of Breath   Amoxicillin-Pot Clavulanate Other (See Comments), Nausea Only and Nausea And Vomiting   Aspirin Other (See Comments)    Other Reaction: GI Upset    Penicillins Other (See Comments), Nausea Only and Nausea And Vomiting   Prednisone Other (See Comments) and Nausea Only    "I just throw prednisone back up, but Kenalog has never bothered me"    Sulfa Antibiotics Nausea Only and Nausea And Vomiting   Cefaclor Other (See Comments)    GI upset    Cefdinir Diarrhea   Cephalexin Other (See Comments)   Ciprofloxacin Other (See Comments)    GI upset   Dexlansoprazole     Other reaction(s): GI Upset (intolerance), N/V, worsening diarrhea, Other (see comments)   Hydrocodone Nausea And Vomiting    Other reaction(s): appetite loss, Other (see comments), vomiting   Meloxicam Swelling    Tongue swelling   Metoclopramide     Other reaction(s): Not available   Metronidazole Other (See Comments)    Severe headache   Other Other (See Comments)    Any Floxin Drugs - tendonitis    Oxycodone     Other reaction(s): Not available   Oxycodone Hcl     Other reaction(s): stomach upset   Solifenacin Other (See Comments)    Unable to void, had to be catheterized   Sulfamethoxazole     Other reaction(s): Not available   Sulfamethoxazole-Trimethoprim     Other reaction(s): vomiting, vomiting   Tramadol     Other reaction(s): Other (see comments), vomiting   Travoprost     Other reaction(s): Not available   Gentamicin Rash    HOME MEDICATIONS: Outpatient Medications Prior to Visit  Medication Sig Dispense Refill   acetaminophen (TYLENOL) 325 MG tablet Take 1-2 tablets (325-650 mg total) by mouth every 4 (four) hours as needed for mild pain.     calcium carbonate (TUMS - DOSED IN MG ELEMENTAL CALCIUM) 500 MG chewable tablet Chew 2 tablets (400 mg of elemental calcium total) by mouth 2 (two) times daily with  a meal.     cholecalciferol (D-VI-SOL) 10 MCG/ML LIQD Take 5 mLs (2,000 Units total) by mouth daily. 150 mL 1   Cyanocobalamin (B-12 COMPLIANCE INJECTION IJ) Inject 1 Dose as directed every 14 (fourteen) days.     diclofenac sodium (VOLTAREN) 1 % GEL Apply 2 g topically 4 (four) times daily. 4 Tube 0   lansoprazole (PREVACID) 30 MG capsule Take 30 mg by mouth every morning.  11   meclizine (ANTIVERT) 25 MG tablet Take 25 mg by mouth 3 (three) times daily.     Multiple Vitamin (MULTIVITAMIN WITH MINERALS) TABS tablet Take 1 tablet by mouth  daily.     No facility-administered medications prior to visit.    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   Drug induced constipation    Elevated blood pressure reading    Essential hypertension    Fracture    Frequent UTI    Gait abnormality    Generalized osteoarthritis    GERD (gastroesophageal reflux disease)    GERD without esophagitis    Glaucoma    Heel spur    Hemorrhoids    Hip fracture requiring operative repair (HCC) 10/14/2018   Hip fracture requiring operative repair, right, closed, initial encounter (HCC) 10/08/2018   History of Clostridium difficile infection    History of COVID-19    History of fracture of right hip 12/16/2018   History of hemiarthroplasty of right hip 07/14/2019   Hyperglycemia    Hyperlipidemia    Hypertension    Hypoalbuminemia due to protein-calorie malnutrition (HCC)    Irregular bowel habits 06/26/2022   Irritable bowel syndrome 06/26/2022   Leukocytosis    Malaise and fatigue 12/16/2018   Medication intolerance 10/08/2018   Metabolic bone disease    Mixed hyperlipidemia    Moderate episode of recurrent major depressive disorder (HCC)    Muscle weakness    Noncompliance    Nuclear sclerotic cataract of both eyes    Osteoarthritis    Osteoporosis 06/26/2022   Polyosteoarthritis, 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   PONV (postoperative nausea and vomiting)    Post-operative pain    Postoperative pain    Postural dizziness with presyncope    Primary open angle glaucoma of left eye, mild stage    Primary open angle glaucoma  of right eye, mild stage    Steroid-induced hyperglycemia    Symptomatic varicose veins, bilateral 06/15/2022   Thrombocytopenia (HCC)    Toenail fungus 08/18/2019   Trochanteric bursitis of right hip 12/04/2021   Upper abdominal pain 06/26/2022   Urinary frequency    Vertigo    Vitamin B12 deficiency     PAST SURGICAL HISTORY: Past Surgical History:  Procedure Laterality Date   APPENDECTOMY     BLADDER SUSPENSION     CERVICAL DISC SURGERY     arthroplasty C3-4   CERVICAL FUSION     3 4 & 5    CHOLECYSTECTOMY     HIP ARTHROPLASTY Right 10/08/2018   Procedure: ARTHROPLASTY BIPOLAR HIP (HEMIARTHROPLASTY);  Surgeon: Myrene Galas, MD;  Location:  [facility];  Service: Orthopedics;  Laterality: Right;   KNEE SURGERY     OVARIAN CYST REMOVAL     SHOULDER SURGERY     TONSILLECTOMY      FAMILY HISTORY: Family History  Problem Relation Age of Onset   Hypertension Mother    Cancer Mother    Cancer Father    Hypertension Father    Stroke Father    Heart disease Father    High blood pressure Father    Atrial fibrillation Father    Hypertension Brother    High blood pressure Brother     SOCIAL HISTORY: Social History   Socioeconomic History   Marital status: Widowed    Spouse name: Not on file   Number of children: Not on file   Years of education: Not on file   Highest education level: Not on file  Occupational History   Occupation: retired  Tobacco Use   Smoking status: Never   Smokeless tobacco: Never  Vaping Use   Vaping Use: Never used  Substance and Sexual Activity   Alcohol use: Never   Drug use: Never   Sexual activity: Not on file  Other Topics Concern   Not on file  Social History Narrative   Not on file   Social Determinants of Health   Financial Resource Strain: Not on file  Food Insecurity: Not on file  Transportation Needs: Not on file  Physical Activity: Not on file  Stress: Not on file  Social Connections: Not on file  Intimate Partner Violence:  Not on file     PHYSICAL EXAM  GENERAL EXAM/CONSTITUTIONAL: Vitals:  Vitals:   07/18/22 1330  BP: (!) 155/88  Pulse: (!) 101  Weight: 130 lb (59 kg)  Height: 5\' 4"  (1.626 m)   Body mass index is 22.31 kg/m. Wt Readings from Last 3 Encounters:  07/18/22 130 lb (59 kg)  07/14/22 130 lb (59 kg)  06/12/22 132 lb (59.9 kg)   Patient is in no distress; well developed, nourished and groomed; neck is supple  CARDIOVASCULAR: Examination of carotid arteries is normal; no carotid bruits Regular rate and rhythm, no murmurs Examination of peripheral vascular system by observation and palpation is normal  EYES: Ophthalmoscopic exam of optic discs and posterior segments is normal; no papilledema or hemorrhages No results found.  MUSCULOSKELETAL: Gait, strength, tone, movements noted in Neurologic exam below  NEUROLOGIC: MENTAL STATUS:      No data to display         awake, alert, oriented to person, place and time recent and remote memory intact normal attention and concentration language fluent, comprehension intact, naming intact fund of knowledge appropriate  CRANIAL NERVE:  2nd - no papilledema on fundoscopic exam 2nd, 3rd, 4th, 6th - pupils equal and reactive to light, visual fields full to confrontation, extraocular muscles intact, no nystagmus 5th - facial sensation symmetric 7th - facial strength symmetric 8th - hearing intact 9th - palate elevates symmetrically, uvula midline 11th - shoulder shrug symmetric 12th - tongue protrusion midline SLIGHTLY SOFT, HOARSE VOICE  MOTOR:  normal bulk and tone, full strength in the BUE, BLE NO TREMOR; NO RIGIDITY BRADYKINESIA IN BUE, BLE  SENSORY:  normal and symmetric to light touch, temperature, vibration  COORDINATION:  finger-nose-finger, fine finger movements normal  REFLEXES:  deep tendon reflexes 3+ IN BUE; 2+ IN BLE and symmetric  GAIT/STATION:  narrow based gait; SHORT STEPS; DECR ARM SWING; WALKING ON  TOES     DIAGNOSTIC DATA (  LABS, IMAGING, TESTING) - I reviewed patient records, labs, notes, testing and imaging myself where available.  Lab Results  Component Value Date   WBC 7.7 10/21/2018   HGB 12.0 10/21/2018   HCT 41.2 10/21/2018   MCV 91.4 10/21/2018   PLT 247 10/21/2018      Component Value Date/Time   NA 140 10/21/2018 0604   K 4.4 10/21/2018 0604   CL 100 10/21/2018 0604   CO2 30 10/21/2018 0604   GLUCOSE 106 (H) 10/21/2018 0604   BUN 18 10/21/2018 0604   CREATININE 0.90 10/21/2018 0604   CALCIUM 9.5 10/21/2018 0604   CALCIUM 8.7 10/12/2018 0338   PROT 6.2 (L) 10/15/2018 0443   ALBUMIN 2.9 (L) 10/15/2018 0443   AST 13 (L) 10/15/2018 0443   ALT 16 10/15/2018 0443   ALKPHOS 71 10/15/2018 0443   BILITOT 0.5 10/15/2018 0443   GFRNONAA >60 10/21/2018 0604   GFRAA >60 10/21/2018 0604   No results found for: "CHOL", "HDL", "LDLCALC", "LDLDIRECT", "TRIG", "CHOLHDL" Lab Results  Component Value Date   HGBA1C 5.2 10/15/2018   No results found for: "VITAMINB12" Lab Results  Component Value Date   TSH 2.802 10/12/2018     04/30/22 MRI brain [I reviewed images myself and agree with interpretation. -VRP]  1. Cerebellar atrophy. 2. Mild for age chronic small vessel ischemia. 3. No reversible finding.  05/18/22 MRI thoracic spine  - No significant spinal canal or neural foraminal stenosis. No abnormal cord signal. - Generalized paraspinal muscle atrophy.  06/27/22 DATsan Asymmetric decreased radiotracer activity in the is RIGHT striatum. Finding can be associated with Parkinson's syndrome pathology.   Of note, DaTSCAN is not diagnostic of Parkinsonian syndromes, which remains a clinical diagnosis. DaTscan is an adjuvant test to aid in the clinical diagnosis of Parkinsonian syndromes.   06/19/22 MRI cervical spine Prior ACDF at C3-4 and C4-5.   There is mild degenerative disc disease at C5-6 and small central disc herniation at C2-3.   There is no spinal  stenosis, disc herniation or cause for a radiculopathy seen.   Normal cervical spinal cord.    ASSESSMENT AND PLAN  76 y.o. year old female here with:   Dx:  1. Parkinsonism, 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HCC)     PLAN:  PARKINSONISM --> POSTURAL INSTABILITY / GAIT DIFF (? atypical parkinsonism PiGD, MSA-a vs MSA-p) - carb/levo half tab twice a day or three times a day; after few weeks gradually increase to 1 tab three times a day  - PT / OT evaluation  WEIGHT LOSS (? Malabsorption vs decreased appetite) - follow up with GI and PCP  LIGHTHEADEDNESS (sitting or standing) - could be related to low BP, decreased PO intake, or neurodegenerative (PD vs MSA) - follow up with cardiology  Meds ordered this encounter  Medications   carbidopa-levodopa (SINEMET IR) 25-100 MG tablet    Sig: Take 1 tablet by mouth 3 (three) times daily before meals.    Dispense:  90 tablet    Refill:  6   Return in about 4 months (around 11/17/2022).  I spent 45 minutes of face-to-face and non-face-to-face time with patient.  This included previsit chart review, lab review, study review, order entry, electronic health record documentation, patient education.       Suanne Marker, MD 07/18/2022, 3:41 PM Certified in Neurology, Neurophysiology and Neuroimaging  [facility] [address], [address] [place], [place] [address] [phone]

## 2022-07-18 NOTE — Patient Instructions (Signed)
-   carb/levo half tab twice a day or three times a day; after few weeks gradually increase to 1 tab three times a day

## 2022-07-20 ENCOUNTER — Encounter: Payer: Medicare Other | Admitting: Occupational Therapy

## 2022-07-24 ENCOUNTER — Ambulatory Visit: Payer: Medicare Other | Admitting: Physical Therapy

## 2022-07-25 ENCOUNTER — Telehealth: Payer: Self-pay | Admitting: Occupational Therapy

## 2022-07-25 ENCOUNTER — Encounter: Payer: Self-pay | Admitting: Occupational Therapy

## 2022-07-25 ENCOUNTER — Ambulatory Visit: Payer: Medicare Other | Attending: Diagnostic Neuroimaging | Admitting: Occupational Therapy

## 2022-07-25 DIAGNOSIS — R293 Abnormal posture: Secondary | ICD-10-CM | POA: Diagnosis present

## 2022-07-25 DIAGNOSIS — R2689 Other abnormalities of gait and mobility: Secondary | ICD-10-CM | POA: Insufficient documentation

## 2022-07-25 DIAGNOSIS — R278 Other lack of coordination: Secondary | ICD-10-CM | POA: Insufficient documentation

## 2022-07-25 DIAGNOSIS — M25612 Stiffness of left shoulder, not elsewhere classified: Secondary | ICD-10-CM | POA: Insufficient documentation

## 2022-07-25 DIAGNOSIS — R42 Dizziness and giddiness: Secondary | ICD-10-CM | POA: Insufficient documentation

## 2022-07-25 DIAGNOSIS — H8111 Benign paroxysmal vertigo, right ear: Secondary | ICD-10-CM | POA: Insufficient documentation

## 2022-07-25 DIAGNOSIS — M25621 Stiffness of right elbow, not elsewhere classified: Secondary | ICD-10-CM | POA: Insufficient documentation

## 2022-07-25 DIAGNOSIS — R2681 Unsteadiness on feet: Secondary | ICD-10-CM | POA: Diagnosis present

## 2022-07-25 DIAGNOSIS — H8112 Benign paroxysmal vertigo, left ear: Secondary | ICD-10-CM | POA: Diagnosis present

## 2022-07-25 DIAGNOSIS — R4789 Other speech disturbances: Secondary | ICD-10-CM

## 2022-07-25 DIAGNOSIS — R41842 Visuospatial deficit: Secondary | ICD-10-CM | POA: Insufficient documentation

## 2022-07-25 DIAGNOSIS — M25611 Stiffness of right shoulder, not elsewhere classified: Secondary | ICD-10-CM | POA: Insufficient documentation

## 2022-07-25 DIAGNOSIS — R29898 Other symptoms and signs involving the musculoskeletal system: Secondary | ICD-10-CM | POA: Insufficient documentation

## 2022-07-25 DIAGNOSIS — R29818 Other symptoms and signs involving the nervous system: Secondary | ICD-10-CM | POA: Insufficient documentation

## 2022-07-25 DIAGNOSIS — M25622 Stiffness of left elbow, not elsewhere classified: Secondary | ICD-10-CM | POA: Diagnosis present

## 2022-07-25 NOTE — Therapy (Signed)
OUTPATIENT OCCUPATIONAL THERAPY PARKINSON'S EVALUATION  Patient Name: [name] MRN: [id number] DOB:[birth year], 76 y.o., female Today's Date: 07/25/2022  PCP: Dr. Feliciana Rossetti REFERRING PROVIDER: Dr. Joycelyn Schmid   OT End of Session - 07/25/22 1538     Visit Number 1    Number of Visits 25    Date for OT Re-Evaluation 10/23/22    Authorization Type Medicare / [place], [place]    Authorization - Visit Number 1    Authorization - Number of Visits 10    Progress Note Due on Visit 10    OT Start Time 1112    OT Stop Time 1200    OT Time Calculation (min) 48 min    Activity Tolerance Patient tolerated treatment well    Behavior During Therapy [facility] for tasks assessed/performed             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   Drug induced constipation    Elevated blood pressure reading    Essential hypertension    Fracture    Frequent UTI    Gait abnormality    Generalized osteoarthritis    GERD (gastroesophageal reflux disease)    GERD without esophagitis    Glaucoma    Heel spur    Hemorrhoids    Hip fracture requiring operative repair (HCC) 10/14/2018   Hip fracture requiring operative repair, right, closed, initial encounter (HCC) 10/08/2018   History of Clostridium difficile infection    History of COVID-19    History of fracture of right hip 12/16/2018   History of hemiarthroplasty of right hip 07/14/2019   Hyperglycemia    Hyperlipidemia    Hypertension    Hypoalbuminemia due to protein-calorie malnutrition (HCC)    Irregular bowel habits  06/26/2022   Irritable bowel syndrome 06/26/2022   Leukocytosis    Malaise and fatigue 12/16/2018   Medication intolerance 10/08/2018   Metabolic bone disease    Mixed hyperlipidemia    Moderate episode of recurrent major depressive disorder (HCC)    Muscle weakness    Noncompliance    Nuclear sclerotic cataract of both eyes    Osteoarthritis    Osteoporosis 06/26/2022   Polyosteoarthritis, 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   PONV (postoperative nausea and vomiting)    Post-operative pain    Postoperative pain    Postural dizziness with presyncope    Primary open angle glaucoma of left eye, mild stage    Primary open angle glaucoma of right eye, mild stage    Steroid-induced hyperglycemia    Symptomatic varicose veins, bilateral 06/15/2022   Thrombocytopenia (HCC)    Toenail fungus 08/18/2019   Trochanteric bursitis of right hip 12/04/2021   Upper abdominal pain 06/26/2022   Urinary frequency    Vertigo    Vitamin B12 deficiency    Past Surgical History:  Procedure Laterality Date   APPENDECTOMY     BLADDER SUSPENSION     CERVICAL DISC SURGERY     arthroplasty C3-4   CERVICAL FUSION     3 4 & 5  CHOLECYSTECTOMY     HIP ARTHROPLASTY Right 10/08/2018   Procedure: ARTHROPLASTY BIPOLAR HIP (HEMIARTHROPLASTY);  Surgeon: Altamese [place], MD;  Location: [place];  Service: Orthopedics;  Laterality: Right;   KNEE SURGERY     OVARIAN CYST REMOVAL     SHOULDER SURGERY     TONSILLECTOMY     Patient Active Problem List   Diagnosis Date Noted   Cardiac murmur 07/14/2022   Palpitations 07/14/2022   Arthritis 06/26/2022   Cataract 06/26/2022   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[place]) 0000000   Complication of anesthesia 06/26/2022   DDD (degenerative disc disease), cervical 06/26/2022   Detrusor instability 06/26/2022   Frequent UTI 06/26/2022   Gait abnormality 06/26/2022   GERD (gastroesophageal reflux disease) 06/26/2022   GERD without esophagitis 06/26/2022   Glaucoma 06/26/2022   Heel spur 06/26/2022    Hemorrhoids 06/26/2022   History of COVID-19 06/26/2022   Hyperlipidemia 06/26/2022   Mixed hyperlipidemia 06/26/2022   Moderate episode of recurrent major depressive disorder ([place]) 06/26/2022   Muscle weakness 06/26/2022   Nuclear sclerotic cataract of both eyes 06/26/2022   Osteoarthritis 06/26/2022   PONV (postoperative nausea and vomiting) 06/26/2022   Postural dizziness with presyncope 06/26/2022   Primary open angle glaucoma of left eye, mild stage 06/26/2022   Primary open angle glaucoma of right eye, mild stage 06/26/2022   Vertigo 06/26/2022   Vitamin B12 deficiency 06/26/2022   Candidal vulvovaginitis 06/26/2022   Chronic idiopathic constipation 06/26/2022   Colon cancer screening 06/26/2022   Irregular bowel habits 06/26/2022   Upper abdominal pain 06/26/2022   Irritable bowel syndrome 06/26/2022   Osteoporosis 06/26/2022   Symptomatic varicose veins, bilateral 06/15/2022   Trochanteric bursitis of right hip 12/04/2021   Diverticular disease of colon 99991111   Complication associated with orthopedic device ([place]) 07/18/2021   Toenail fungus 08/18/2019   History of hemiarthroplasty of right hip 07/14/2019   History of fracture of right hip 12/16/2018   Malaise and fatigue 12/16/2018   Hypertension    Elevated blood pressure reading    Hypoalbuminemia due to protein-calorie malnutrition ([place])    Noncompliance    Hip fracture requiring operative repair ([place]) 10/14/2018   Fracture    Hyperglycemia    Urinary frequency    Thrombocytopenia (HCC)    Drug induced constipation    Acute blood loss anemia    Metabolic bone disease    Post-operative pain    Generalized osteoarthritis    Steroid-induced hyperglycemia    Postoperative pain    Leukocytosis    Hip fracture requiring operative repair, right, closed, initial encounter ([place]) 10/08/2018   Medication intolerance 10/08/2018   Essential hypertension    History of Clostridium difficile infection     Polyosteoarthritis, 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    ONSET DATE: 07/18/22  REFERRING DIAG: 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 (ICD-10-CM) - Parkinsonism, 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[place])   THERAPY DIAG:  Other symptoms and signs involving the nervous system  Other lack of coordination  Stiffness of right shoulder, not elsewhere classified  Stiffness of left shoulder, not elsewhere classified  Stiffness of right elbow, not elsewhere classified  Stiffness of left elbow, not elsewhere classified  Other symptoms and signs involving the musculoskeletal system  Abnormal posture  Unsteadiness on feet  Visuospatial deficit  Rationale for Evaluation and Treatment Rehabilitation  SUBJECTIVE:   SUBJECTIVE STATEMENT: Pt reports that she began to notice symptoms incr this Spring with incr functional difficulty, but that some things began happening over last year.  Pt reports difficulty using hands.  Pt accompanied  by: self  PERTINENT HISTORY: Pt presented to neurology with constellation of problems including gait and balance difficulty, dizziness, weight loss, depression, malaise. DATscan shows signs for parkinsonism. Per Neurology notes:  considering POSTURAL INSTABILITY / GAIT DIFF (? 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  Pt reports that she has not yet started carbidopa/levodopa, but will be starting soon.  (Afraid that she would be nauseous for OT/PT appts)   PMH:  hx of R hip arthroplasty s/p fx, hx of cervical fusion, R hx of knee surgery, hx of L shoulder surgery (RTC repair), HTN, OA, thrombocytopenia, hx of vertigo, glaucoma, cataracts, vitamin B12 deficiency, urinary frequency, osteoporosis, hx of depression, hyperlipidemia, hx of frequent UTI  PRECAUTIONS: Fall  WEIGHT BEARING RESTRICTIONS No  PAIN:  Are you having pain? Yes: NPRS scale: 0-8/10 Pain location: both knees distally Pain description: heavy, pain Aggravating factors: walking a lot, going up steps Relieving factors: pain meds, ankle  pumps, elevating   FALLS: Has patient fallen in last 6 months? No, hx of fall 3 years ago  LIVING ENVIRONMENT: Lives with: lives alone Lives in: House/apartment Stairs:  2 steps to get in house with railing, 1-level house Has following equipment at home: Single point cane and Environmental consultant - 4 wheeled  PLOF: Independent and Leisure: being in yard (unable to do [employer])  PATIENT GOALS improve movement, walking, and daily activities  OBJECTIVE:   HAND DOMINANCE: Right  ADLs: Eating: difficulty with cutting food, difficulty opening packages/containers Grooming: water pick, difficulty brushing teeth, can't cut toenails, difficulty brushing hair and drying hair UB Dressing: incr time  LB Dressing: has sock aide, incr time, uses elastic/difficult with buttons Toileting: 3-in-1, mod I Bathing: mod I, hand-held shower head and long handled brush, difficulty scrubbing hair with LUE Tub Shower transfers: tub transfer bench, mod I  IADLs: Shopping: grocery pick up, but difficulty getting in the home Light housekeeping: can't mop, vacuum, difficulty with dusting Meal Prep: mod I, incr time Community mobility: independent, hands get tired, vertigo Medication management: mod I Handwriting:  micrographia per pt report, fatigue  MOBILITY STATUS: Independent, difficulty with turns, start hesitation, difficulty carrying objections with ambulation, and uses cane in community (due to lighter) and rollator in the house   POSTURE COMMENTS:  rounded shoulders, forward head, and flexed trunk   ACTIVITY TOLERANCE: Activity tolerance: incr fatigue since Spring, frequent breaks, has to break down cleaning tasks  FUNCTIONAL OUTCOME MEASURES: Standing functional reach: Right: 7 inches; Left: 5 inches Fastening/unfastening 3 buttons: 26sec Physical performance test: PPT#2 (simulated eating) 10.93sec (holds spoon at the end) & PPT#4 (donning/doffing jacket): 11.28sec  COORDINATION: 9 Hole Peg test:  Right: 27.84 sec; Left: 29.50 sec Box and Blocks:  Right 45blocks, Left 40blocks Pt reports mild, intermittent resting tremor LUE  UE ROM:  WFL except   L shoulder flex 110*with -40* elbow ext, R 100* shoulder flex with  -45* elbow ext  UE MMT:   Not tested  SENSATION: Had some burning, jumping, sensation changes before symptoms started in back of neck and arms--not currently  MUSCLE TONE: Not formally tested due to time constraints; however appears to demo incr rigidity with functional movements  COGNITION: Overall cognitive status: Within functional limits for tasks assessed and denies changes  VISION:  Pt reports hx of diplopia, glaucoma, cataracts.  No current diplopia, but reports eyes "jump" with fatigue and hx of vertigo that has gotten worse.  Vision will be assessed further in functional context prn.   OBSERVATIONS: Bradykinesia  TODAY'S TREATMENT:  Evaluation completed.--see pt education   PATIENT EDUCATION: Education details: Discussed/reviewed sinemet initiation instructions from MD and recommendation to take it with crackers/toast (no protein) and then eat approx 16min later.  Discussed Parkinsonism symptoms, OT eval results and POC.  Also discussed recommendation for Speech therapy referral due to pt concerns about voice (pt agrees with request).   Person educated: Patient Education method: Explanation Education comprehension: verbalized understanding   HOME EXERCISE PROGRAM: Not yet issued  ASSESSMENT:  CLINICAL IMPRESSION: Patient is a 75 y.o. female who was seen today for occupational therapy evaluation for Parkinsonism.  Pt was recently diagnosed and has not started medication yet.  Pt reports incr difficulty with functional movements this year.  Pt presents with bradykinesia, rigidity, decr coordination, abnormal posture, decr balance/functional mobility, decr ROM, decr activity tolerance, and possible visual changes. Pt would benefit from occupational  therapy to address these deficits for improved safety/ease with ADLs/IADLs, improved UE functional use, to establish PD-specific HEP, and decr risk of future complications and improve quality of life.   PERFORMANCE DEFICITS in functional skills including ADLs, IADLs, coordination, dexterity, tone, ROM, FMC, GMC, mobility, balance, endurance, decreased knowledge of precautions, decreased knowledge of use of DME, vision, and UE functional use,  and psychosocial skills including environmental adaptation, habits, and routines and behaviors.   IMPAIRMENTS are limiting patient from ADLs, IADLs, and leisure.   COMORBIDITIES may have co-morbidities  that affects occupational performance. Patient will benefit from skilled OT to address above impairments and improve overall function.  MODIFICATION OR ASSISTANCE TO COMPLETE EVALUATION: Min-Moderate modification of tasks or assist with assess necessary to complete an evaluation.  OT OCCUPATIONAL PROFILE AND HISTORY: Detailed assessment: Review of records and additional review of physical, cognitive, psychosocial history related to current functional performance.  CLINICAL DECISION MAKING: Moderate - several treatment options, min-mod task modification necessary  REHAB POTENTIAL: Good  EVALUATION COMPLEXITY: Moderate     GOALS: Potential Goals reviewed with patient? Yes  SHORT TERM GOALS: Target date: 08/22/2022    Pt will be independent with PD-specific HEP. Goal status: INITIAL  2.  Pt will verbalize understanding of ways to decr risk of complications related to Parkinsonism. Goal status: INITIAL  3.  Pt will improve coordination/functional reaching for ADLs as shown by improving score on box and blocks test by at least 5 bilaterally. Baseline:  R-45, L-40 blocks Goal status: INITIAL  4.  Pt will demo at least 120* L shoulder flex with at least -30* elbow ext for functional reaching. Baseline:  110* with -40* elbow ext Goal status:  INITIAL  5.  Pt will demo at least 110* R shoulder flex with at least -35* elbow ext for functional reaching. Baseline:  110* with -40* elbow ext Goal status: INITIAL  6.  Pt will be able to write at least 3 sentences with no significant decr in size. Goal status: INITIAL  LONG TERM GOALS: Target date: 10/23/2022    Pt will verbalize understanding of AE/strategies to incr ease/safety of ADLs/IADLs and decr risk of future complications (including strategies for cutting food, opening containers, brushing teeth, brushing/washing hair, dressing, cleaning tasks, writing) Goal status: INITIAL  2.  Pt will verbalize understanding of appropriate community resources. Goal status: INITIAL  3.  Further assess vision and establish goal/HEP as appropriate. Goal status: INITIAL  4.  Pt will improve balance/functional reaching for cleaning tasks as shown by improving standing functional reach by at least 3" bilaterally. Baseline:  R-7", L-5" Goal status: INITIAL  5.  Pt will demo at least 120* L shoulder flex with at least -20* elbow ext for functional reaching. Baseline:  110* with -40* elbow ext Goal status: INITIAL  6.  Pt will demo at least 120* R shoulder flex with at least -30* elbow ext for functional reaching. Baseline:  110* with -40* elbow ext Goal status: INITIAL  PLAN: OT FREQUENCY: 2x/week  OT DURATION: 12 weeks +eval due to scheduling   PLANNED INTERVENTIONS: self care/ADL training, therapeutic exercise, therapeutic activity, neuromuscular re-education, manual therapy, passive range of motion, balance training, functional mobility training, aquatic therapy, ultrasound, paraffin, fluidotherapy, moist heat, cryotherapy, patient/family education, visual/perceptual remediation/compensation, energy conservation, coping strategies training, and DME and/or AE instructions  RECOMMENDED OTHER SERVICES: scheduled for PT evaluation tomorrow; pt would also benefit from ST evaluation for low  voice volume  CONSULTED AND AGREED WITH PLAN OF CARE: Patient  PLAN FOR NEXT SESSION: initiate PWR! Moves, PWR! Hands, begin large amplitude movement strategies for ADLs   Demetrick Eichenberger, OTR/L  07/25/2022, 3:42 PM

## 2022-07-25 NOTE — Telephone Encounter (Signed)
Dr. Marjory Lies,  I evaluated [name] today for occupational therapy today.  [name] reports/demonstrates lower voice volume and would like a referral for speech therapy.  Please send a referral via epic for Speech therapy if you agree.  Thank you,  Willa Frater, OTR/L [facility] [address] [place], [place]  [address] [phone] phone [phone] 07/25/22 3:53 PM

## 2022-07-25 NOTE — Addendum Note (Signed)
Addended by: [place] Pink on: 07/25/2022 04:18 PM   Modules accepted: Orders

## 2022-07-26 ENCOUNTER — Encounter: Payer: Self-pay | Admitting: Physical Therapy

## 2022-07-26 ENCOUNTER — Telehealth: Payer: Self-pay | Admitting: Diagnostic Neuroimaging

## 2022-07-26 ENCOUNTER — Ambulatory Visit: Payer: Medicare Other | Admitting: Physical Therapy

## 2022-07-26 DIAGNOSIS — R29818 Other symptoms and signs involving the nervous system: Secondary | ICD-10-CM

## 2022-07-26 DIAGNOSIS — H8111 Benign paroxysmal vertigo, right ear: Secondary | ICD-10-CM

## 2022-07-26 DIAGNOSIS — R42 Dizziness and giddiness: Secondary | ICD-10-CM

## 2022-07-26 DIAGNOSIS — H8112 Benign paroxysmal vertigo, left ear: Secondary | ICD-10-CM

## 2022-07-26 DIAGNOSIS — R2689 Other abnormalities of gait and mobility: Secondary | ICD-10-CM

## 2022-07-26 DIAGNOSIS — R293 Abnormal posture: Secondary | ICD-10-CM

## 2022-07-26 DIAGNOSIS — R2681 Unsteadiness on feet: Secondary | ICD-10-CM

## 2022-07-26 NOTE — Therapy (Signed)
OUTPATIENT PHYSICAL THERAPY NEURO EVALUATION   Patient Name: [name] MRN: [id number] DOB:11-29-1945, 76 y.o., female Today's Date: 07/26/2022   PCP: Gordan Payment., MD REFERRING PROVIDER: Suanne Marker, MD   PT End of Session - 07/26/22 1637     Visit Number 1    Number of Visits 17    Date for PT Re-Evaluation 10/24/22    Authorization Type Medicare    PT Start Time 1530    PT Stop Time 1620    PT Time Calculation (min) 50 min    Equipment Utilized During Treatment Gait belt    Activity Tolerance Patient tolerated treatment well    Behavior During Therapy [facility] for tasks assessed/performed             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   Drug induced constipation    Elevated blood pressure reading    Essential hypertension    Fracture    Frequent UTI    Gait abnormality    Generalized osteoarthritis    GERD (gastroesophageal reflux disease)    GERD without esophagitis    Glaucoma    Heel spur    Hemorrhoids    Hip fracture requiring operative repair (HCC) 10/14/2018   Hip fracture requiring operative repair, right, closed, initial encounter (HCC) 10/08/2018   History of Clostridium difficile infection    History of COVID-19    History of fracture of right hip 12/16/2018   History of hemiarthroplasty of right hip 07/14/2019   Hyperglycemia    Hyperlipidemia    Hypertension    Hypoalbuminemia due to protein-calorie malnutrition (HCC)    Irregular bowel habits 06/26/2022   Irritable bowel syndrome 06/26/2022   Leukocytosis    Malaise and  fatigue 12/16/2018   Medication intolerance 10/08/2018   Metabolic bone disease    Mixed hyperlipidemia    Moderate episode of recurrent major depressive disorder (HCC)    Muscle weakness    Noncompliance    Nuclear sclerotic cataract of both eyes    Osteoarthritis    Osteoporosis 06/26/2022   Polyosteoarthritis, 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   PONV (postoperative nausea and vomiting)    Post-operative pain    Postoperative pain    Postural dizziness with presyncope    Primary open angle glaucoma of left eye, mild stage    Primary open angle glaucoma of right eye, mild stage    Steroid-induced hyperglycemia    Symptomatic varicose veins, bilateral 06/15/2022   Thrombocytopenia (HCC)    Toenail fungus 08/18/2019   Trochanteric bursitis of right hip 12/04/2021   Upper abdominal pain 06/26/2022   Urinary frequency    Vertigo    Vitamin B12 deficiency    Past Surgical History:  Procedure Laterality Date   APPENDECTOMY     BLADDER SUSPENSION     CERVICAL DISC SURGERY     arthroplasty C3-4   CERVICAL FUSION     3 4 & 5    CHOLECYSTECTOMY     HIP ARTHROPLASTY Right 10/08/2018   Procedure: ARTHROPLASTY BIPOLAR HIP (HEMIARTHROPLASTY);  Surgeon: Carola Frost,  Casimiro Needle, MD;  Location: [facility] OR;  Service: Orthopedics;  Laterality: Right;   KNEE SURGERY     OVARIAN CYST REMOVAL     SHOULDER SURGERY     TONSILLECTOMY     Patient Active Problem List   Diagnosis Date Noted   Cardiac murmur 07/14/2022   Palpitations 07/14/2022   Arthritis 06/26/2022   Cataract 06/26/2022   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   Complication of anesthesia 06/26/2022   DDD (degenerative disc disease), cervical 06/26/2022   Detrusor instability 06/26/2022   Frequent UTI 06/26/2022   Gait abnormality 06/26/2022   GERD (gastroesophageal reflux disease) 06/26/2022   GERD without esophagitis 06/26/2022   Glaucoma 06/26/2022   Heel spur 06/26/2022   Hemorrhoids 06/26/2022   History of COVID-19 06/26/2022   Hyperlipidemia  06/26/2022   Mixed hyperlipidemia 06/26/2022   Moderate episode of recurrent major depressive disorder (HCC) 06/26/2022   Muscle weakness 06/26/2022   Nuclear sclerotic cataract of both eyes 06/26/2022   Osteoarthritis 06/26/2022   PONV (postoperative nausea and vomiting) 06/26/2022   Postural dizziness with presyncope 06/26/2022   Primary open angle glaucoma of left eye, mild stage 06/26/2022   Primary open angle glaucoma of right eye, mild stage 06/26/2022   Vertigo 06/26/2022   Vitamin B12 deficiency 06/26/2022   Candidal vulvovaginitis 06/26/2022   Chronic idiopathic constipation 06/26/2022   Colon cancer screening 06/26/2022   Irregular bowel habits 06/26/2022   Upper abdominal pain 06/26/2022   Irritable bowel syndrome 06/26/2022   Osteoporosis 06/26/2022   Symptomatic varicose veins, bilateral 06/15/2022   Trochanteric bursitis of right hip 12/04/2021   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   Complication associated with orthopedic device (HCC) 07/18/2021   Toenail fungus 08/18/2019   History of hemiarthroplasty of right hip 07/14/2019   History of fracture of right hip 12/16/2018   Malaise and fatigue 12/16/2018   Hypertension    Elevated blood pressure reading    Hypoalbuminemia due to protein-calorie malnutrition (HCC)    Noncompliance    Hip fracture requiring operative repair (HCC) 10/14/2018   Fracture    Hyperglycemia    Urinary frequency    Thrombocytopenia (HCC)    Drug induced constipation    Acute blood loss anemia    Metabolic bone disease    Post-operative pain    Generalized osteoarthritis    Steroid-induced hyperglycemia    Postoperative pain    Leukocytosis    Hip fracture requiring operative repair, right, closed, initial encounter (HCC) 10/08/2018   Medication intolerance 10/08/2018   Essential hypertension    History of Clostridium difficile infection    Polyosteoarthritis, 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 Was recently diagnosed with Parkinsonism. Feels like she is slipping her feet. Just started the Carbidopa Levodopa yesterday and noticed that she was having trouble voiding. Made neurologist aware and they recommended to stop taking it. Has been using a cane 3 years off and on after her hip surgery.  When she is in the house, uses the rollator for balance. Has always had dizziness when laying down and standing back up off and on for several years. Reports this time is the worse. Taking Meclizine 2x a day for the dizziness - has been on it for over a month. Saw [facility] for a couple visits, and decided to switch care to this location for vertigo/Parkinsonism. Can't remember what they looked at for her vertigo. Can't vacuum, mop, and clean like she used to. Pt also with R knee pain, uses Voltaren gel and wears a knee sleeve. Used to use a recumbent bike at home, but hasn't been doing it at home.   Pt accompanied by: self  PERTINENT HISTORY: Pt presented to neurology with constellation of problems including gait and balance difficulty, dizziness, weight loss, depression, malaise. DATscan shows signs for parkinsonism. Per Neurology notes:   considering POSTURAL INSTABILITY / GAIT DIFF (? 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  Pt reports that she has not yet started carbidopa/levodopa, but will be starting soon.  (Afraid that she would be nauseous for OT/PT appts)   PMH:  hx of R hip arthroplasty s/p fx, hx of cervical fusion, R hx of knee surgery, hx of L shoulder surgery (RTC repair), HTN, OA, thrombocytopenia, hx of vertigo, glaucoma, cataracts, vitamin B12 deficiency, urinary frequency, osteoporosis, hx of depression, hyperlipidemia, hx of frequent UTI  PAIN:  Are you having pain? Yes: NPRS scale: 5/10 Pain location: R hip Pain description: Sharp Aggravating factors: Stepping down on it. Relieving factors: Staying off it, Exercise, Ice   PRECAUTIONS: Fall  FALLS: Has patient fallen in last 6 months? No  LIVING ENVIRONMENT: Lives with: lives alone Lives in: House/apartment Stairs: 2 steps to get in house with railing, 1-level house Has following equipment at home: Single point cane and Walker - 4 wheeled  PLOF: Independent with household mobility with device, Independent with community mobility with device, and Leisure: being in the yard (unable to do yardwork)  PATIENT GOALS To feel better - be able to move, get rid of the tiredness, wants to walk better. Wants to be able to walk on her grass (has a sloped backyard)   OBJECTIVE:   DIAGNOSTIC FINDINGS: 06/27/22 DATsan Asymmetric decreased radiotracer activity in the is RIGHT striatum. Finding can be associated with Parkinson's syndrome pathology.  04/30/22 MRI brain  1. Cerebellar atrophy. 2. Mild for age chronic small vessel ischemia. 3. No reversible finding.    COGNITION: Overall cognitive status: Within functional limits for tasks assessed   SENSATION: WFL  COORDINATION: Heel to shin: difficulty performing with RLE due to hip pain.     MUSCLE TONE: Rigidity.   POSTURE: rounded shoulders and forward head  LOWER EXTREMITY ROM:   Decr AROM  with RLE.   LOWER EXTREMITY MMT:    MMT Right Eval Left Eval  Hip flexion 4+/5 4+/5  Hip extension    Hip abduction    Hip adduction    Hip internal rotation    Hip external rotation    Knee flexion 4/5 4+/5  Knee extension 4+/5 4+/5  Ankle dorsiflexion 5/5 5/5  Ankle plantarflexion    Ankle inversion    Ankle eversion    (Blank rows = not tested)  BED MOBILITY:  Pt reports no difficulties, has a bed rail to help   TRANSFERS: Assistive device utilized: None  Sit to stand: SBA Stand to sit: SBA Pt with genu valgum with standing, takes a couple attempts to stand without use of UE.    GAIT: Gait pattern: step through pattern, decreased arm swing- Right, decreased step length- Right, decreased step length- Left, decreased stride length, decreased ankle dorsiflexion- Right, decreased ankle dorsiflexion- Left, knee flexed in stance- Right, knee flexed in stance- Left, shuffling, decreased trunk rotation, trunk flexed, poor foot clearance- Right, and poor foot clearance- Left Distance walked: Clinic distances  Assistive device utilized: Single point cane Level of assistance: SBA Comments: Pt with decr heel strike with gait and almost walks on her toes. Decr step length with LLE. Pt reports festination like episodes at home when her steps get shuffled and she has incr forward flexed posture. Pt with difficulty picking up her feet when turning, performs very slowly. Does not lift her R foot up.   FUNCTIONAL TESTs:  5 times sit to stand: 26.78 seconds with no UE support  10 meter walk test: 20.38 seconds =  1.61 ft/sec with SPC  Push and release test: Anterior = 1 step, Posterior = 2 steps, R/L lateral = 1-2 steps   [facility] PT Assessment - 07/26/22 1642       Standardized Balance Assessment   Standardized Balance Assessment Timed Up and Go Test      Timed Up and Go Test   Normal TUG (seconds) 23.03    Manual TUG (seconds) 26.65    Cognitive TUG (seconds) 34    TUG Comments  Performs with SPC, difficulty with turns              TODAY'S TREATMENT:  N/A during eval    PATIENT EDUCATION: Education details: Clinical findings, POC.  Discussed Parkinsonism symptoms and how they relate to gait/functional mobility and areas that will be addressed in therapy. Discussed PT will need to perform vestibular assessment at next session (pt was asking PT to treat it today, but PT unable to see notes from [facility] regarding their vestibular assessment and this PT will have to perform their own to determine potential causes of dizziness).  Person educated: Patient Education method: Explanation Education comprehension: verbalized understanding   HOME EXERCISE PROGRAM: Will provide at a future session.     GOALS: Goals reviewed with patient? Yes  SHORT TERM GOALS: Target date: 08/24/2022  Pt will be independent with initial HEP in order to build upon functional gains made in therapy. Baseline: Goal status: INITIAL  2.  Pt will undergo further vestibular assessment with STG/LTG updated. Baseline:  Goal status: INITIAL  3.  Pt will improve gait speed with LRAD to at least 1.9 ft/sec in order to demo decr fall risk.   Baseline:  20.38 seconds =  1.61 ft/sec with SPC Goal status: INITIAL  4.  Pt will improve 5x sit<>stand to less than or equal to 21 sec to demonstrate improved functional strength and transfer efficiency. Baseline: 26.78 seconds with no UE support  Goal status: INITIAL  5.  Pt will improve TUG time to 19 seconds or less with LRAD in order to demo decrease fall risk/improved turns  Baseline: 23.03 seconds Goal status: INITIAL    LONG TERM GOALS: Target date: 09/21/2022  Pt will be independent with final HEP for PD/vestibular deficits in order to build upon functional gains made in therapy. Baseline:  Goal status: INITIAL  2.  Vestibular goal to be written as appropriate. Baseline:  Goal status: INITIAL  3.  Pt will verbalize  understanding of local Parkinson's disease resources, including options for continued community fitness. Baseline:  Goal status: INITIAL  4.  Pt will improve gait speed with LRAD to at least 2.3 ft/sec in order to demo improved community mobility. Baseline:  20.38 seconds =  1.61 ft/sec with SPC Goal status: INITIAL  5.  Pt will improve 5x sit<>stand to less than or equal to 18 sec to demonstrate improved functional strength and transfer efficiency. Baseline: 26.78 seconds with no UE support Goal status: INITIAL  6.  Pt will improve cog TUG time with LRAD to 25 seconds or less in order to demo decrease fall risk/improved dual tasking.  Baseline: 34 seconds Goal status: INITIAL  ASSESSMENT:  CLINICAL IMPRESSION: Patient is a 76 year old female referred to Neuro OPPT for Parkinsonism/Vertigo.  Pt has been recently diagnosed with Parkinsonism. Pt's PMH is significant for: see below. The following deficits were present during the exam: bradykinesia, rigidity, decr coordination, abnormal posture, impaired balance/functional mobility, decr activity tolerance, gait abnormalities, dizziness, decr strength. Based on gait speed, 5x sit <> stand, and TUG pt is an incr risk for falls. Pt would benefit from skilled PT to address these impairments and functional limitations to maximize functional mobility independence and decr fall risk. Will perform further vestibular assessment at next session.     OBJECTIVE IMPAIRMENTS Abnormal gait, decreased activity tolerance, decreased balance, decreased coordination, decreased endurance, decreased mobility, difficulty walking, decreased ROM, decreased strength, dizziness, impaired flexibility, impaired tone, postural dysfunction, and pain.   ACTIVITY LIMITATIONS carrying, lifting, bending, standing, squatting, stairs, transfers, bed mobility, and locomotion level  PARTICIPATION LIMITATIONS: driving, shopping, and community activity  PERSONAL FACTORS Age,  Behavior pattern, Past/current experiences, Time since onset of injury/illness/exacerbation, and 3+ comorbidities: hx of R hip arthroplasty s/p fx, hx of cervical fusion, R hx of knee surgery, hx of L shoulder surgery (RTC repair), HTN, OA, thrombocytopenia, hx of vertigo, glaucoma, cataracts, vitamin B12 deficiency, urinary frequency, osteoporosis, hx of depression, hyperlipidemia, hx of frequent UTI  are also affecting patient's functional outcome.   REHAB POTENTIAL: Good  CLINICAL DECISION MAKING: Evolving/moderate complexity  EVALUATION COMPLEXITY: Moderate  PLAN: PT FREQUENCY: 2x/week  PT DURATION: 12 weeks  PLANNED INTERVENTIONS: Therapeutic exercises, Therapeutic activity, Neuromuscular re-education, Balance training, Gait training, Patient/Family education, Self Care, Stair training, Vestibular training, Canalith repositioning, DME instructions, and Manual therapy  PLAN FOR NEXT SESSION: Perform further vestibular assessment and update goals. Initial HEP for sit <> stands, try some PWR moves, standing balance.    Arliss Journey, PT, DPT  07/26/2022, 4:40 PM

## 2022-07-26 NOTE — Telephone Encounter (Signed)
Called patient who stated she stared carb/levo yesterday, took 1/2 carb/levo once yesterday at 1 pm. When she woke at 2 am to void she was unable. She drank 4 cups water and was able to void later. She felt nauseated this morning. She has history of urinary problems, frequent UTIs, slow flow, and her  meclizine "slows things down".  She typically has nocturia every 2 hours, denies pain, burning with urination and stated she is fine now. She does not want to take anymore carb/levo. She has FU with PCP tomorrow and has made PCP aware of situation. She is sure PCP will do UA. I advised she not take anymore carb/levo, will discuss with Dr Marjory Lies and call her if he has any other recommendations. Patient verbalized understanding, appreciation.

## 2022-07-26 NOTE — Telephone Encounter (Signed)
Pt is calling. Pt said she is taking carbidopa-levodopa (SINEMET IR) 25-100 MG tablet and she can't urinate. Pt states it started at 2 am this morning. Pt is requesting a call back from the nurse

## 2022-07-27 DIAGNOSIS — G20A1 Parkinson's disease without dyskinesia, without mention of fluctuations: Secondary | ICD-10-CM | POA: Insufficient documentation

## 2022-07-27 HISTORY — DX: Parkinson's disease without dyskinesia, without mention of fluctuations: G20.A1

## 2022-07-30 ENCOUNTER — Encounter: Payer: Self-pay | Admitting: Diagnostic Neuroimaging

## 2022-07-31 ENCOUNTER — Ambulatory Visit: Payer: Medicare Other | Admitting: Physical Therapy

## 2022-07-31 NOTE — Telephone Encounter (Signed)
Pt called back, I reiterated what Dr. Leta Baptist said multiple times to pt

## 2022-07-31 NOTE — Telephone Encounter (Signed)
Addressed via TE 07/26/22

## 2022-07-31 NOTE — Telephone Encounter (Signed)
Pt called back, I reiterated what Dr. Leta Baptist said multiple times to pt.  I told pt I would send the work in Dr Dr.Ahern a message as far as if there are any other recommendations to help pt. If not will try to reach back to Dr. Leta Baptist when back in office.

## 2022-08-02 ENCOUNTER — Ambulatory Visit: Payer: Medicare Other | Admitting: Physical Therapy

## 2022-08-02 ENCOUNTER — Encounter: Payer: Self-pay | Admitting: Physical Therapy

## 2022-08-02 VITALS — BP 128/75 | HR 95

## 2022-08-02 DIAGNOSIS — R2689 Other abnormalities of gait and mobility: Secondary | ICD-10-CM

## 2022-08-02 DIAGNOSIS — R29818 Other symptoms and signs involving the nervous system: Secondary | ICD-10-CM | POA: Diagnosis not present

## 2022-08-02 DIAGNOSIS — R42 Dizziness and giddiness: Secondary | ICD-10-CM

## 2022-08-02 DIAGNOSIS — R2681 Unsteadiness on feet: Secondary | ICD-10-CM

## 2022-08-02 NOTE — Therapy (Signed)
OUTPATIENT PHYSICAL THERAPY NEURO TREATMENT   Patient Name: [name] MRN: [id number] DOB:[birth year], 76 y.o., female Today's Date: 08/02/2022   PCP: Gordan Payment., MD REFERRING PROVIDER: Suanne Marker, MD   PT End of Session - 08/02/22 1317     Visit Number 2    Number of Visits 17    Date for PT Re-Evaluation 10/24/22    Authorization Type Medicare    PT Start Time 1316    PT Stop Time 1402    PT Time Calculation (min) 46 min    Equipment Utilized During Treatment --    Activity Tolerance Patient tolerated treatment well   limited by dizziness   Behavior During Therapy [facility] for tasks assessed/performed             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   Drug induced constipation    Elevated blood pressure reading    Essential hypertension    Fracture    Frequent UTI    Gait abnormality    Generalized osteoarthritis    GERD (gastroesophageal reflux disease)    GERD without esophagitis    Glaucoma    Heel spur    Hemorrhoids    Hip fracture requiring operative repair (HCC) 10/14/2018   Hip fracture requiring operative repair, right, closed, initial encounter (HCC) 10/08/2018   History of Clostridium difficile infection    History of COVID-19    History of fracture of right hip 12/16/2018   History of hemiarthroplasty of right hip 07/14/2019   Hyperglycemia    Hyperlipidemia    Hypertension    Hypoalbuminemia due to protein-calorie malnutrition (HCC)    Irregular bowel habits 06/26/2022   Irritable bowel syndrome 06/26/2022   Leukocytosis     Malaise and fatigue 12/16/2018   Medication intolerance 10/08/2018   Metabolic bone disease    Mixed hyperlipidemia    Moderate episode of recurrent major depressive disorder (HCC)    Muscle weakness    Noncompliance    Nuclear sclerotic cataract of both eyes    Osteoarthritis    Osteoporosis 06/26/2022   Polyosteoarthritis, 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   PONV (postoperative nausea and vomiting)    Post-operative pain    Postoperative pain    Postural dizziness with presyncope    Primary open angle glaucoma of left eye, mild stage    Primary open angle glaucoma of right eye, mild stage    Steroid-induced hyperglycemia    Symptomatic varicose veins, bilateral 06/15/2022   Thrombocytopenia (HCC)    Toenail fungus 08/18/2019   Trochanteric bursitis of right hip 12/04/2021   Upper abdominal pain 06/26/2022   Urinary frequency    Vertigo    Vitamin B12 deficiency    Past Surgical History:  Procedure Laterality Date   APPENDECTOMY     BLADDER SUSPENSION     CERVICAL DISC SURGERY     arthroplasty C3-4   CERVICAL FUSION     3 4 & 5    CHOLECYSTECTOMY     HIP ARTHROPLASTY Right 10/08/2018   Procedure: ARTHROPLASTY BIPOLAR HIP (HEMIARTHROPLASTY);  Surgeon: Myrene Galas, MD;  Location: [facility] OR;  Service: Orthopedics;  Laterality: Right;   KNEE SURGERY     OVARIAN CYST REMOVAL     SHOULDER SURGERY     TONSILLECTOMY     Patient Active Problem List   Diagnosis Date Noted   Cardiac murmur 07/14/2022   Palpitations 07/14/2022   Arthritis 06/26/2022   Cataract 06/26/2022   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   Complication of anesthesia 06/26/2022   DDD (degenerative disc disease), cervical 06/26/2022   Detrusor instability 06/26/2022   Frequent UTI 06/26/2022   Gait abnormality 06/26/2022   GERD (gastroesophageal reflux disease) 06/26/2022   GERD without esophagitis 06/26/2022   Glaucoma 06/26/2022   Heel spur 06/26/2022   Hemorrhoids 06/26/2022   History of COVID-19 06/26/2022    Hyperlipidemia 06/26/2022   Mixed hyperlipidemia 06/26/2022   Moderate episode of recurrent major depressive disorder (HCC) 06/26/2022   Muscle weakness 06/26/2022   Nuclear sclerotic cataract of both eyes 06/26/2022   Osteoarthritis 06/26/2022   PONV (postoperative nausea and vomiting) 06/26/2022   Postural dizziness with presyncope 06/26/2022   Primary open angle glaucoma of left eye, mild stage 06/26/2022   Primary open angle glaucoma of right eye, mild stage 06/26/2022   Vertigo 06/26/2022   Vitamin B12 deficiency 06/26/2022   Candidal vulvovaginitis 06/26/2022   Chronic idiopathic constipation 06/26/2022   Colon cancer screening 06/26/2022   Irregular bowel habits 06/26/2022   Upper abdominal pain 06/26/2022   Irritable bowel syndrome 06/26/2022   Osteoporosis 06/26/2022   Symptomatic varicose veins, bilateral 06/15/2022   Trochanteric bursitis of right hip 12/04/2021   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   Complication associated with orthopedic device (HCC) 07/18/2021   Toenail fungus 08/18/2019   History of hemiarthroplasty of right hip 07/14/2019   History of fracture of right hip 12/16/2018   Malaise and fatigue 12/16/2018   Hypertension    Elevated blood pressure reading    Hypoalbuminemia due to protein-calorie malnutrition (HCC)    Noncompliance    Hip fracture requiring operative repair (HCC) 10/14/2018   Fracture    Hyperglycemia    Urinary frequency    Thrombocytopenia (HCC)    Drug induced constipation    Acute blood loss anemia    Metabolic bone disease    Post-operative pain    Generalized osteoarthritis    Steroid-induced hyperglycemia    Postoperative pain    Leukocytosis    Hip fracture requiring operative repair, right, closed, initial encounter (HCC) 10/08/2018   Medication intolerance 10/08/2018   Essential hypertension    History of Clostridium difficile infection    Polyosteoarthritis, 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 Went down to sit at the table and felt like she was going to the L. Grabbed the table so she wouldn't fall. Doesn't know when its going to happen. Had to take a Meclizine. Did not take one today.   Pt accompanied by: self  PERTINENT HISTORY: Pt presented to neurology with constellation of problems including gait and balance difficulty, dizziness, weight loss, depression, malaise. DATscan shows signs for parkinsonism. Per Neurology notes:  considering POSTURAL INSTABILITY / GAIT DIFF (? 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  Pt reports that she has not yet started carbidopa/levodopa, but will be starting soon.  (Afraid that she would be nauseous for OT/PT appts)   PMH:  hx of R hip arthroplasty s/p fx, hx of cervical fusion, R hx of knee surgery, hx of L shoulder surgery (RTC repair), HTN, OA, thrombocytopenia, hx of vertigo, glaucoma, cataracts, vitamin B12 deficiency, urinary frequency, osteoporosis, hx of depression, hyperlipidemia, hx of frequent UTI  PAIN:  Are you having pain? Yes: NPRS scale: 5/10 Pain location: R hip/knee Pain description: Sharp Aggravating factors: Stepping down on it. Relieving factors: Staying off it, Exercise, Ice   Vitals:   08/02/22 1328 08/02/22 1329  BP: 127/76 128/75  Pulse: 77 95   Sitting, Standing   PRECAUTIONS: Fall  PATIENT GOALS To feel better - be able to move, get rid of the tiredness, wants to  walk better. Wants to be able to walk on her grass (has a sloped backyard)    OBJECTIVE:   VESTIBULAR ASSESSMENT    SYMPTOM BEHAVIOR:   Subjective history:Has always had dizziness getting up and getting down. The worst happened a couple months ago, felt like she was going to the L. Went to the ER at that point and was given Meclizine and Valium.  Reports has been feeling more lightheaded in the past 2-3 weeks. More recent episode was this past Sunday.    Non-Vestibular symptoms:  reports L ear feels like it might start hurting.    Type of dizziness: Imbalance (Disequilibrium), Unsteady with head/body turns, and Lightheadedness/Faint   Frequency: Pretty frequently over the past couple months.    Duration: Depends, Meclizine helps with functioning.    Aggravating factors: Induced by position change: lying supine and Induced by motion: turning body quickly, turning head quickly, and has to be careful when looking up.    Relieving factors: closing eyes and taking Meclizine.    Progression of symptoms: better (after previous vestibular tx at [facility])   OCULOMOTOR EXAM:   Ocular Alignment: normal   Ocular ROM: No Limitations   Spontaneous Nystagmus: absent   Gaze-Induced Nystagmus: absent   Smooth Pursuits: intact   Saccades: intact     VESTIBULAR - OCULAR REFLEX:    Slow VOR: Normal   VOR Cancellation: Normal, reports feeling lightheaded    Head-Impulse Test: HIT Right: negative HIT Left: negative   Dynamic Visual Acuity: Did not assess.   Modified VBI testing: negative bilaterally.     POSITIONAL TESTING: Right Dix-Hallpike: no nystagmus Left Dix-Hallpike: no nystagmus Right Roll Test: no nystagmus Left Roll Test: no nystagmus Right Sidelying: no nystagmus Left Sidelying: no nystagmus  Pt reporting mild dizziness when initially put in [facility], Rolling, and Sidelying positions, however PT did not note any nystagmus and pt felt like that her eyes were moving, only just  for a quick second when being moved into position. Re-assessed Gilberto Better positions with use of Frenzel goggles, no  nystagmus noted and pt reported she did not feel like her eyes were moving. Pt with mild dizziness when coming back to upright. One instance pt felt like she was going to go backwards and had to hold onto the mat table.   MOTION SENSITIVITY:    Motion Sensitivity Quotient  Intensity: 0 = none, 1 = Lightheaded, 2 = Mild, 3 = Moderate, 4 = Severe, 5 = Vomiting  Intensity  1. Sitting to supine 2  2. Supine to L side 2  3. Supine to R side 2  4. Supine to sitting 2  5. L Hallpike-Dix 2  6. Up from L  2  7. R Hallpike-Dix 2  8. Up from R  2  9. Sitting, head  tipped to L knee   10. Head up from L  knee   11. Sitting, head  tipped to R knee   12. Head up from R  knee   13. Sitting head turns x5   14.Sitting head nods x5   15. In stance, 180  turn to L    16. In stance, 180  turn to R       TODAY'S TREATMENT:  Access Code: [email or web address] URL: [email or web address] Date: 08/02/2022 Prepared by: Sherlie Banhloe Caylan Schifano  Initiated HEP for habituation/positional vertigo. See MedBridge for more details. Performed 3 reps each side in session with pt reporting feeling slightly better.   Exercises - Brandt-Daroff Vestibular Exercise  - 1-2 x daily - 5 x weekly - 2 sets - 3-4 reps   PATIENT EDUCATION: Education details: Results of vestibular assessment. Francee PiccoloBrandt Daroff exercises for home as it sounds like it could be positional vertigo, but pt not having any nystagmus during session. Pt reports that her eye movements have been worse when she was at [facility] and was better after being treated here (sounds like it was something positional) Person educated: Patient Education method: Explanation, Demonstration, and Handouts Education comprehension: verbalized understanding and returned demonstration   HOME EXERCISE PROGRAM: Will provide at a future session.      GOALS: Goals reviewed with patient? Yes  SHORT TERM GOALS: Target date: 08/24/2022  Pt will be independent with initial HEP in order to build upon functional gains made in therapy. Baseline: Goal status: INITIAL  2.  Pt will finish MSQ testing with LTG updated.  Baseline:  Goal status: INITIAL  3.  Pt will improve gait speed with LRAD to at least 1.9 ft/sec in order to demo decr fall risk.   Baseline:  20.38 seconds =  1.61 ft/sec with SPC Goal status: INITIAL  4.  Pt will improve 5x sit<>stand to less than or equal to 21 sec to demonstrate improved functional strength and transfer efficiency. Baseline: 26.78 seconds with no UE support  Goal status: INITIAL  5.  Pt will improve TUG time to 19 seconds or less with LRAD in order to demo decrease fall risk/improved turns  Baseline: 23.03 seconds Goal status: INITIAL    LONG TERM GOALS: Target date: 09/21/2022  Pt will be independent with final HEP for PD/vestibular deficits in order to build upon functional gains made in therapy. Baseline:  Goal status: INITIAL  2.  Pt will perform all items on MSQ with 1/5 or less in order to demo improved motion sensitivity.  Baseline:  Goal status: INITIAL  3.  Pt will verbalize understanding of local Parkinson's disease resources, including options for continued community fitness. Baseline:  Goal status: INITIAL  4.  Pt will improve gait speed with  LRAD to at least 2.3 ft/sec in order to demo improved community mobility. Baseline:  20.38 seconds =  1.61 ft/sec with SPC Goal status: INITIAL  5.  Pt will improve 5x sit<>stand to less than or equal to 18 sec to demonstrate improved functional strength and transfer efficiency. Baseline: 26.78 seconds with no UE support Goal status: INITIAL  6.  Pt will improve cog TUG time with LRAD to 25 seconds or less in order to demo decrease fall risk/improved dual tasking.  Baseline: 34 seconds Goal status:  INITIAL  ASSESSMENT:  CLINICAL IMPRESSION: Performed vestibular assessment during today's session. Pt having a normal oculomotor exam, but did report feeling lightheaded with VOR cancellation. With positional testing, pt reporting mild dizziness and felt like her eyes were slightly moving when being put into the position, but PT did not note any nystagmus. Re-assessed Marye Round with Frenzel goggles on and still no nystagmus was noted. Assessed BP in sitting/standing and pt did not demonstrate any orthostatics. It seems like pt's dizziness might be positional (pt was seen previously at [facility] recently for dizziness and it sounds like they did positional maneuvers, however pt does not exactly know what they did and PT can't see their notes). Gave pt Nestor Lewandowsky for HEP and performed 3 reps during session which pt reporting feeling slightly better with her sx after 3 reps. Will continue per POC.     OBJECTIVE IMPAIRMENTS Abnormal gait, decreased activity tolerance, decreased balance, decreased coordination, decreased endurance, decreased mobility, difficulty walking, decreased ROM, decreased strength, dizziness, impaired flexibility, impaired tone, postural dysfunction, and pain.   ACTIVITY LIMITATIONS carrying, lifting, bending, standing, squatting, stairs, transfers, bed mobility, and locomotion level  PARTICIPATION LIMITATIONS: driving, shopping, and community activity  PERSONAL FACTORS Age, Behavior pattern, Past/current experiences, Time since onset of injury/illness/exacerbation, and 3+ comorbidities: hx of R hip arthroplasty s/p fx, hx of cervical fusion, R hx of knee surgery, hx of L shoulder surgery (RTC repair), HTN, OA, thrombocytopenia, hx of vertigo, glaucoma, cataracts, vitamin B12 deficiency, urinary frequency, osteoporosis, hx of depression, hyperlipidemia, hx of frequent UTI  are also affecting patient's functional outcome.   REHAB POTENTIAL: Good  CLINICAL DECISION  MAKING: Evolving/moderate complexity  EVALUATION COMPLEXITY: Moderate  PLAN: PT FREQUENCY: 2x/week  PT DURATION: 12 weeks  PLANNED INTERVENTIONS: Therapeutic exercises, Therapeutic activity, Neuromuscular re-education, Balance training, Gait training, Patient/Family education, Self Care, Stair training, Vestibular training, Canalith repositioning, DME instructions, and Manual therapy  PLAN FOR NEXT SESSION: Re-assess positional testing. How were Nestor Lewandowsky exercises? Check DVA/mCTSIB.  Initial HEP for sit <> stands, try some PWR moves, standing balance.    Arliss Journey, PT, DPT  08/02/2022, 2:24 PM

## 2022-08-07 NOTE — Therapy (Signed)
OUTPATIENT OCCUPATIONAL THERAPY PARKINSON'S EVALUATION  Patient Name: [name] MRN: [id number] DOB:Jul 03, 1946, 76 y.o., female Today's Date: 08/08/2022  PCP: Dr. Gilford Rile REFERRING PROVIDER: Dr. Andrey Spearman   OT End of Session - 08/08/22 0809     Visit Number 2    Number of Visits 25    Date for OT Re-Evaluation 10/23/22    Authorization Type Medicare / [place], [place]: [place]    Authorization - Visit Number 2    Authorization - Number of Visits 10    Progress Note Due on Visit 10    OT Start Time 0806    OT Stop Time 0848    OT Time Calculation (min) 42 min    Activity Tolerance Patient tolerated treatment well    Behavior During Therapy [facility] for tasks assessed/performed              Past Medical History:  Diagnosis Date   Acute blood loss anemia    Arthritis    oa   Candidal vulvovaginitis 06/26/2022   Cataract    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[place])    Chronic idiopathic constipation 06/26/2022   Colon cancer screening 0000000   Complication associated with orthopedic device ([place]) [id number]   Complication of anesthesia    DDD (degenerative disc disease), cervical    Detrusor instability    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   Drug induced constipation    Elevated blood pressure reading    Essential hypertension    Fracture    Frequent UTI    Gait abnormality    Generalized osteoarthritis    GERD (gastroesophageal reflux disease)    GERD without esophagitis    Glaucoma    Heel spur    Hemorrhoids    Hip fracture requiring operative repair ([place]) 10/14/2018   Hip fracture requiring operative repair, right, closed, initial encounter ([place]) 10/08/2018   History of Clostridium difficile infection    History of COVID-19    History of fracture of right hip 12/16/2018   History of hemiarthroplasty of right hip 07/14/2019   Hyperglycemia    Hyperlipidemia    Hypertension    Hypoalbuminemia due to protein-calorie malnutrition ([place])    Irregular bowel habits  06/26/2022   Irritable bowel syndrome 06/26/2022   Leukocytosis    Malaise and fatigue 12/16/2018   Medication intolerance [id number]   Metabolic bone disease    Mixed hyperlipidemia    Moderate episode of recurrent major depressive disorder (HCC)    Muscle weakness    Noncompliance    Nuclear sclerotic cataract of both eyes    Osteoarthritis    Osteoporosis 06/26/2022   Polyosteoarthritis, 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   PONV (postoperative nausea and vomiting)    Post-operative pain    Postoperative pain    Postural dizziness with presyncope    Primary open angle glaucoma of left eye, mild stage    Primary open angle glaucoma of right eye, mild stage    Steroid-induced hyperglycemia    Symptomatic varicose veins, bilateral 06/15/2022   Thrombocytopenia (HCC)    Toenail fungus 08/18/2019   Trochanteric bursitis of right hip 12/04/2021   Upper abdominal pain 06/26/2022   Urinary frequency    Vertigo    Vitamin B12 deficiency    Past Surgical History:  Procedure Laterality Date   APPENDECTOMY     BLADDER SUSPENSION     CERVICAL DISC SURGERY     arthroplasty C3-4   CERVICAL FUSION     3 4 & 5  CHOLECYSTECTOMY     HIP ARTHROPLASTY Right 10/08/2018   Procedure: ARTHROPLASTY BIPOLAR HIP (HEMIARTHROPLASTY);  Surgeon: Altamese Moffat, MD;  Location: [place];  Service: Orthopedics;  Laterality: Right;   KNEE SURGERY     OVARIAN CYST REMOVAL     SHOULDER SURGERY     TONSILLECTOMY     Patient Active Problem List   Diagnosis Date Noted   Cardiac murmur 07/14/2022   Palpitations 07/14/2022   Arthritis 06/26/2022   Cataract 06/26/2022   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[place]) 95/07/3266   Complication of anesthesia 06/26/2022   DDD (degenerative disc disease), cervical 06/26/2022   Detrusor instability 06/26/2022   Frequent UTI 06/26/2022   Gait abnormality 06/26/2022   GERD (gastroesophageal reflux disease) 06/26/2022   GERD without esophagitis 06/26/2022   Glaucoma 06/26/2022   Heel spur 06/26/2022    Hemorrhoids 06/26/2022   History of COVID-19 06/26/2022   Hyperlipidemia 06/26/2022   Mixed hyperlipidemia 06/26/2022   Moderate episode of recurrent major depressive disorder ([place]) 06/26/2022   Muscle weakness 06/26/2022   Nuclear sclerotic cataract of both eyes 06/26/2022   Osteoarthritis 06/26/2022   PONV (postoperative nausea and vomiting) 06/26/2022   Postural dizziness with presyncope 06/26/2022   Primary open angle glaucoma of left eye, mild stage 06/26/2022   Primary open angle glaucoma of right eye, mild stage 06/26/2022   Vertigo 06/26/2022   Vitamin B12 deficiency 06/26/2022   Candidal vulvovaginitis 06/26/2022   Chronic idiopathic constipation 06/26/2022   Colon cancer screening 06/26/2022   Irregular bowel habits 06/26/2022   Upper abdominal pain 06/26/2022   Irritable bowel syndrome 06/26/2022   Osteoporosis 06/26/2022   Symptomatic varicose veins, bilateral 06/15/2022   Trochanteric bursitis of right hip 12/04/2021   Diverticular disease of colon 12/45/8099   Complication associated with orthopedic device ([place]) 07/18/2021   Toenail fungus 08/18/2019   History of hemiarthroplasty of right hip 07/14/2019   History of fracture of right hip 12/16/2018   Malaise and fatigue 12/16/2018   Hypertension    Elevated blood pressure reading    Hypoalbuminemia due to protein-calorie malnutrition ([place])    Noncompliance    Hip fracture requiring operative repair ([place]) 10/14/2018   Fracture    Hyperglycemia    Urinary frequency    Thrombocytopenia (HCC)    Drug induced constipation    Acute blood loss anemia    Metabolic bone disease    Post-operative pain    Generalized osteoarthritis    Steroid-induced hyperglycemia    Postoperative pain    Leukocytosis    Hip fracture requiring operative repair, right, closed, initial encounter ([place]) 10/08/2018   Medication intolerance 10/08/2018   Essential hypertension    History of Clostridium difficile infection     Polyosteoarthritis, 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    ONSET DATE: 07/18/22  REFERRING DIAG: G20 (ICD-10-CM) - Parkinsonism, 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[place])   THERAPY DIAG:  Other symptoms and signs involving the nervous system  Abnormal posture  Unsteadiness on feet  Other lack of coordination  Stiffness of right shoulder, not elsewhere classified  Stiffness of left shoulder, not elsewhere classified  Stiffness of right elbow, not elsewhere classified  Stiffness of left elbow, not elsewhere classified  Other symptoms and signs involving the musculoskeletal system  Visuospatial deficit  Rationale for Evaluation and Treatment Rehabilitation  SUBJECTIVE:   SUBJECTIVE STATEMENT: Pt reports that she was unable to tolerate Sinemet (took 1 day) due to urinary retention   Pt accompanied by: self  PERTINENT HISTORY: Pt presented to neurology with constellation of problems  including gait and balance difficulty, dizziness, weight loss, depression, malaise. DATscan shows signs for parkinsonism. Per Neurology notes:  considering POSTURAL INSTABILITY / GAIT DIFF (? 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  Pt reports that she has not yet started carbidopa/levodopa, but will be starting soon.  (Afraid that she would be nauseous for OT/PT appts)   PMH:  hx of R hip arthroplasty s/p fx, hx of cervical fusion, R hx of knee surgery, hx of L shoulder surgery (RTC repair), HTN, OA, thrombocytopenia, hx of vertigo, glaucoma, cataracts, vitamin B12 deficiency, urinary frequency, osteoporosis, hx of depression, hyperlipidemia, hx of frequent UTI  PRECAUTIONS: Fall, VERTIGO WITH HEAD/EYE MOVEMENTS, not currently taking Sinemet as it caused bladder retention   WEIGHT BEARING RESTRICTIONS No  PAIN:  Are you having pain? Yes: NPRS scale: 4/10 Pain location: both knees distally Pain description: heavy, pain Aggravating factors: walking a lot, going up steps Relieving factors: pain meds, ankle  pumps, elevating   FALLS: Has patient fallen in last 6 months? No, hx of fall 3 years ago  LIVING ENVIRONMENT: Lives with: lives alone Lives in: House/apartment Stairs:  2 steps to get in house with railing, 1-level house Has following equipment at home: Single point cane and Walker - 4 wheeled  PLOF: Independent and Leisure: being in yard (unable to do yardwork)  PATIENT GOALS improve movement, walking, and daily activities  OBJECTIVE:   HAND DOMINANCE: Right   TODAY'S TREATMENT:   Pt instructed in initial HEP--see below.  Attempted PWR! Rock; however, pt with difficult due to vertigo despite keeping head/eyes straight ahead.  Flipping cards with each hand with min-mod cueing/focus on large amplitude movements (for grasp, release, and supination).  PATIENT EDUCATION: Education details: PWR! Up in sitting, Sitting closed-chain shoulder flexion, PWR! Hands (basic 4); instructed pt in use of large amplitude movements to address bradykinesia and rigidity as well as exercise for brain change. Person educated: Patient Education method: Explanation, Demonstration, Tactile cues, Verbal cues, and Handouts Education comprehension: verbalized understanding, returned demonstration, verbal cues required, and tactile cues required   HOME EXERCISE PROGRAM: 08/08/22:  PWR! Up in sitting, Sitting closed-chain shoulder flexion, PWR! Hands (basic 4).  Instructed pt in use of large amplitude movements to address bradykinesia and rigidity as well as exercise for brain change.   GOALS: Potential Goals reviewed with patient? Yes  SHORT TERM GOALS: Target date: 08/22/2022    Pt will be independent with PD-specific HEP. Goal status: INITIAL  2.  Pt will verbalize understanding of ways to decr risk of complications related to Parkinsonism. Goal status: INITIAL  3.  Pt will improve coordination/functional reaching for ADLs as shown by improving score on box and blocks test by at least 5  bilaterally. Baseline:  R-45, L-40 blocks Goal status: INITIAL  4.  Pt will demo at least 120* L shoulder flex with at least -30* elbow ext for functional reaching. Baseline:  110* with -40* elbow ext Goal status: INITIAL  5.  Pt will demo at least 110* R shoulder flex with at least -35* elbow ext for functional reaching. Baseline:  110* with -40* elbow ext Goal status: INITIAL  6.  Pt will be able to write at least 3 sentences with no significant decr in size. Goal status: INITIAL  LONG TERM GOALS: Target date: 10/23/2022    Pt will verbalize understanding of AE/strategies to incr ease/safety of ADLs/IADLs and decr risk of future complications (including strategies for cutting food, opening containers, brushing teeth, brushing/washing hair, dressing, cleaning  tasks, writing) Goal status: INITIAL  2.  Pt will verbalize understanding of appropriate community resources. Goal status: INITIAL  3.  Further assess vision and establish goal/HEP as appropriate. Goal status: INITIAL  4.  Pt will improve balance/functional reaching for cleaning tasks as shown by improving standing functional reach by at least 3" bilaterally. Baseline:  R-7", L-5" Goal status: INITIAL  5.  Pt will demo at least 120* L shoulder flex with at least -20* elbow ext for functional reaching. Baseline:  110* with -40* elbow ext Goal status: INITIAL  6.  Pt will demo at least 120* R shoulder flex with at least -30* elbow ext for functional reaching. Baseline:  110* with -40* elbow ext Goal status: INITIAL   ASSESSMENT:  CLINICAL IMPRESSION: Pt responds well to cueing for large amplitude movements, but will need continued reinforcement for calibration.  PERFORMANCE DEFICITS in functional skills including ADLs, IADLs, coordination, dexterity, tone, ROM, FMC, GMC, mobility, balance, endurance, decreased knowledge of precautions, decreased knowledge of use of DME, vision, and UE functional use,  and psychosocial  skills including environmental adaptation, habits, and routines and behaviors.   IMPAIRMENTS are limiting patient from ADLs, IADLs, and leisure.   COMORBIDITIES may have co-morbidities  that affects occupational performance. Patient will benefit from skilled OT to address above impairments and improve overall function.  MODIFICATION OR ASSISTANCE TO COMPLETE EVALUATION: Min-Moderate modification of tasks or assist with assess necessary to complete an evaluation.  OT OCCUPATIONAL PROFILE AND HISTORY: Detailed assessment: Review of records and additional review of physical, cognitive, psychosocial history related to current functional performance.  CLINICAL DECISION MAKING: Moderate - several treatment options, min-mod task modification necessary  REHAB POTENTIAL: Good  EVALUATION COMPLEXITY: Moderate   PLAN: OT FREQUENCY: 2x/week  OT DURATION: 12 weeks +eval due to scheduling   PLANNED INTERVENTIONS: self care/ADL training, therapeutic exercise, therapeutic activity, neuromuscular re-education, manual therapy, passive range of motion, balance training, functional mobility training, aquatic therapy, ultrasound, paraffin, fluidotherapy, moist heat, cryotherapy, patient/family education, visual/perceptual remediation/compensation, energy conservation, coping strategies training, and DME and/or AE instructions  RECOMMENDED OTHER SERVICES: scheduled for PT evaluation tomorrow; pt would also benefit from ST evaluation for low voice volume  CONSULTED AND AGREED WITH PLAN OF CARE: Patient  PLAN FOR NEXT SESSION:   coordination HEP with focus on large amplitude movements (PT HAS VERTIGO WITH HEAD/EYE MOVEMENTS TO THE SIDE )   Jadore Veals, OTR/L  08/08/2022, 9:02 AM

## 2022-08-08 ENCOUNTER — Ambulatory Visit: Payer: Medicare Other | Admitting: Physical Therapy

## 2022-08-08 ENCOUNTER — Encounter: Payer: Self-pay | Admitting: Occupational Therapy

## 2022-08-08 ENCOUNTER — Ambulatory Visit: Payer: Medicare Other | Admitting: Occupational Therapy

## 2022-08-08 DIAGNOSIS — R278 Other lack of coordination: Secondary | ICD-10-CM

## 2022-08-08 DIAGNOSIS — M25611 Stiffness of right shoulder, not elsewhere classified: Secondary | ICD-10-CM

## 2022-08-08 DIAGNOSIS — R29818 Other symptoms and signs involving the nervous system: Secondary | ICD-10-CM | POA: Diagnosis not present

## 2022-08-08 DIAGNOSIS — R41842 Visuospatial deficit: Secondary | ICD-10-CM

## 2022-08-08 DIAGNOSIS — M25621 Stiffness of right elbow, not elsewhere classified: Secondary | ICD-10-CM

## 2022-08-08 DIAGNOSIS — M25612 Stiffness of left shoulder, not elsewhere classified: Secondary | ICD-10-CM

## 2022-08-08 DIAGNOSIS — R2681 Unsteadiness on feet: Secondary | ICD-10-CM

## 2022-08-08 DIAGNOSIS — R293 Abnormal posture: Secondary | ICD-10-CM

## 2022-08-08 DIAGNOSIS — M25622 Stiffness of left elbow, not elsewhere classified: Secondary | ICD-10-CM

## 2022-08-08 DIAGNOSIS — R29898 Other symptoms and signs involving the musculoskeletal system: Secondary | ICD-10-CM

## 2022-08-08 NOTE — Patient Instructions (Signed)
   SHOULDER: Flexion Bilateral    Raise arms overhead at same speed. Keep elbows straight.  Head straight ahead, hands flat on ball (or paper towel roll or shoe box) 10 reps per set, 1 sets per day.  Maintain object in midline.   PWR! Hand Exercises  Start with elbows bent and hands closed:  PWR! Hands: Push hands out BIG. Elbows straight, wrists up, fingers open and spread apart BIG.  perform PWR! Hands throughout the day when you are having trouble using your hands (picking up/manipulating small objects, writing, eating, typing, sewing, buttoning, etc.).  PWR! Step: Touch index finger to thumb while keeping other fingers straight. Flick fingers out BIG (thumb out/straighten fingers). Repeat with other fingers. (Step your thumb to each finger).   With arms stretched out in front of you (elbows straight), perform the following:  PWR! Rock:  Move wrists up and down [employer]! Twist: Twist palms up and down BIG   ** Make each movement big and deliberate so that you feel the movement.  Perform all  at least 10 repetitions 1x/day

## 2022-08-09 ENCOUNTER — Ambulatory Visit: Payer: Medicare Other | Admitting: Speech Pathology

## 2022-08-09 ENCOUNTER — Ambulatory Visit: Payer: Medicare Other | Admitting: Physical Therapy

## 2022-08-11 ENCOUNTER — Ambulatory Visit: Payer: Medicare Other | Admitting: Physical Therapy

## 2022-08-14 ENCOUNTER — Ambulatory Visit: Payer: Medicare Other | Attending: Cardiology

## 2022-08-14 DIAGNOSIS — R011 Cardiac murmur, unspecified: Secondary | ICD-10-CM | POA: Diagnosis present

## 2022-08-14 LAB — ECHOCARDIOGRAM COMPLETE
Area-P 1/2: 2.86 cm2
S' Lateral: 3 cm

## 2022-08-14 NOTE — Therapy (Signed)
OUTPATIENT OCCUPATIONAL THERAPY PARKINSON'S EVALUATION  Patient Name: [name] MRN: [id number] DOB:[birth year], 76 y.o., female Today's Date: 08/15/2022  PCP: Dr. Gilford Rile REFERRING PROVIDER: Dr. Andrey Spearman   OT End of Session - 08/15/22 0816     Visit Number 3    Number of Visits 25    Date for OT Re-Evaluation 10/23/22    Authorization Type Medicare / [place] BC[place], [place]: [place]    Authorization - Visit Number 3    Authorization - Number of Visits 10    Progress Note Due on Visit 10    OT Start Time 0810    OT Stop Time 0849    OT Time Calculation (min) 39 min    Activity Tolerance Patient tolerated treatment well    Behavior During Therapy [facility] for tasks assessed/performed               Past Medical History:  Diagnosis Date   Acute blood loss anemia    Arthritis    oa   Candidal vulvovaginitis 06/26/2022   Cataract    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[place])    Chronic idiopathic constipation 06/26/2022   Colon cancer screening 0000000   Complication associated with orthopedic device ([place]) [id number]   Complication of anesthesia    DDD (degenerative disc disease), cervical    Detrusor instability    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   Drug induced constipation    Elevated blood pressure reading    Essential hypertension    Fracture    Frequent UTI    Gait abnormality    Generalized osteoarthritis    GERD (gastroesophageal reflux disease)    GERD without esophagitis    Glaucoma    Heel spur    Hemorrhoids    Hip fracture requiring operative repair ([place]) 10/14/2018   Hip fracture requiring operative repair, right, closed, initial encounter ([place]) 10/08/2018   History of Clostridium difficile infection    History of COVID-19    History of fracture of right hip 12/16/2018   History of hemiarthroplasty of right hip 07/14/2019   Hyperglycemia    Hyperlipidemia    Hypertension    Hypoalbuminemia due to protein-calorie malnutrition ([place])    Irregular bowel habits  06/26/2022   Irritable bowel syndrome 06/26/2022   Leukocytosis    Malaise and fatigue 12/16/2018   Medication intolerance [id number]   Metabolic bone disease    Mixed hyperlipidemia    Moderate episode of recurrent major depressive disorder (HCC)    Muscle weakness    Noncompliance    Nuclear sclerotic cataract of both eyes    Osteoarthritis    Osteoporosis 06/26/2022   Polyosteoarthritis, 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   PONV (postoperative nausea and vomiting)    Post-operative pain    Postoperative pain    Postural dizziness with presyncope    Primary open angle glaucoma of left eye, mild stage    Primary open angle glaucoma of right eye, mild stage    Steroid-induced hyperglycemia    Symptomatic varicose veins, bilateral 06/15/2022   Thrombocytopenia (HCC)    Toenail fungus 08/18/2019   Trochanteric bursitis of right hip 12/04/2021   Upper abdominal pain 06/26/2022   Urinary frequency    Vertigo    Vitamin B12 deficiency    Past Surgical History:  Procedure Laterality Date   APPENDECTOMY     BLADDER SUSPENSION     CERVICAL DISC SURGERY     arthroplasty C3-4   CERVICAL FUSION     3 4 & 5  CHOLECYSTECTOMY     HIP ARTHROPLASTY Right 10/08/2018   Procedure: ARTHROPLASTY BIPOLAR HIP (HEMIARTHROPLASTY);  Surgeon: Altamese Santa Nella, MD;  Location: [place];  Service: Orthopedics;  Laterality: Right;   KNEE SURGERY     OVARIAN CYST REMOVAL     SHOULDER SURGERY     TONSILLECTOMY     Patient Active Problem List   Diagnosis Date Noted   Cardiac murmur 07/14/2022   Palpitations 07/14/2022   Arthritis 06/26/2022   Cataract 06/26/2022   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[place]) 0000000   Complication of anesthesia 06/26/2022   DDD (degenerative disc disease), cervical 06/26/2022   Detrusor instability 06/26/2022   Frequent UTI 06/26/2022   Gait abnormality 06/26/2022   GERD (gastroesophageal reflux disease) 06/26/2022   GERD without esophagitis 06/26/2022   Glaucoma 06/26/2022   Heel spur 06/26/2022    Hemorrhoids 06/26/2022   History of COVID-19 06/26/2022   Hyperlipidemia 06/26/2022   Mixed hyperlipidemia 06/26/2022   Moderate episode of recurrent major depressive disorder ([place]) 06/26/2022   Muscle weakness 06/26/2022   Nuclear sclerotic cataract of both eyes 06/26/2022   Osteoarthritis 06/26/2022   PONV (postoperative nausea and vomiting) 06/26/2022   Postural dizziness with presyncope 06/26/2022   Primary open angle glaucoma of left eye, mild stage 06/26/2022   Primary open angle glaucoma of right eye, mild stage 06/26/2022   Vertigo 06/26/2022   Vitamin B12 deficiency 06/26/2022   Candidal vulvovaginitis 06/26/2022   Chronic idiopathic constipation 06/26/2022   Colon cancer screening 06/26/2022   Irregular bowel habits 06/26/2022   Upper abdominal pain 06/26/2022   Irritable bowel syndrome 06/26/2022   Osteoporosis 06/26/2022   Symptomatic varicose veins, bilateral 06/15/2022   Trochanteric bursitis of right hip 12/04/2021   Diverticular disease of colon [id number]   Complication associated with orthopedic device ([place]) 07/18/2021   Toenail fungus 08/18/2019   History of hemiarthroplasty of right hip 07/14/2019   History of fracture of right hip 12/16/2018   Malaise and fatigue 12/16/2018   Hypertension    Elevated blood pressure reading    Hypoalbuminemia due to protein-calorie malnutrition ([place])    Noncompliance    Hip fracture requiring operative repair ([place]) 10/14/2018   Fracture    Hyperglycemia    Urinary frequency    Thrombocytopenia (HCC)    Drug induced constipation    Acute blood loss anemia    Metabolic bone disease    Post-operative pain    Generalized osteoarthritis    Steroid-induced hyperglycemia    Postoperative pain    Leukocytosis    Hip fracture requiring operative repair, right, closed, initial encounter ([place]) 10/08/2018   Medication intolerance 10/08/2018   Essential hypertension    History of Clostridium difficile infection     Polyosteoarthritis, 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    ONSET DATE: 07/18/22  REFERRING DIAG: 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 (ICD-10-CM) - Parkinsonism, 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[place])   THERAPY DIAG:  Abnormal posture  Unsteadiness on feet  Other lack of coordination  Stiffness of right shoulder, not elsewhere classified  Stiffness of left shoulder, not elsewhere classified  Stiffness of right elbow, not elsewhere classified  Other symptoms and signs involving the nervous system  Stiffness of left elbow, not elsewhere classified  Other symptoms and signs involving the musculoskeletal system  Visuospatial deficit  Rationale for Evaluation and Treatment Rehabilitation  SUBJECTIVE:   SUBJECTIVE STATEMENT: Pt reports fall last week with facial injuries (forehead and lip with glue on forehead)--pt went to the ER.  Fall in utility room, ?due to L foot "sticking" with turn in room/from  doorway     Pt accompanied by: self  PERTINENT HISTORY: Pt presented to neurology with constellation of problems including gait and balance difficulty, dizziness, weight loss, depression, malaise. DATscan shows signs for parkinsonism. Per Neurology notes:  considering POSTURAL INSTABILITY / GAIT DIFF (? 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  Pt reports that she has not yet started carbidopa/levodopa, but will be starting soon.  (Afraid that she would be nauseous for OT/PT appts)   PMH:  hx of R hip arthroplasty s/p fx, hx of cervical fusion, R hx of knee surgery, hx of L shoulder surgery (RTC repair), HTN, OA, thrombocytopenia, hx of vertigo, glaucoma, cataracts, vitamin B12 deficiency, urinary frequency, osteoporosis, hx of depression, hyperlipidemia, hx of frequent UTI  PRECAUTIONS: Fall, VERTIGO WITH HEAD/EYE MOVEMENTS, not currently taking Sinemet as it caused bladder retention   WEIGHT BEARING RESTRICTIONS No  PAIN:  Are you having pain? Yes: NPRS scale: 4/10 Pain location: both knees distally Pain description:  heavy, pain Aggravating factors: walking a lot, going up steps Relieving factors: pain meds, ankle pumps, elevating   FALLS: Has patient fallen in last 6 months? No, hx of fall 3 years ago  LIVING ENVIRONMENT: Lives with: lives alone Lives in: House/apartment Stairs:  2 steps to get in house with railing, 1-level house Has following equipment at home: Single point cane and Walker - 4 wheeled  PLOF: Independent and Leisure: being in yard (unable to do yardwork)  PATIENT GOALS improve movement, walking, and daily activities  OBJECTIVE:   HAND DOMINANCE: Right   TODAY'S TREATMENT:    Functional Sit>stand with min-mod cues initially for large amplitude movement technique after instruction, improved with repetition.  Side-stepping in prep to sit in chair with min cueing.  Functional ambulation within session with min cueing for large amplitude movement strategies including passing RLE with LUE.   Standing at [employer] provided on use of diagonal step with same LE as direction change for turn.  Practiced with min-mod cueing for "clock" strategy and to pass RLE with LLE.    Also instructed pt to stand and perform IADLs with large amplitude movement strategy-- Emphasized feet apart, with 1 foot in front so that pt can wt. Shift in all directions for improved balance.  Practiced forward/back and side to side wt. Shifts in prep for IADLs.  Improved balance noted with min cueing/repetition.        PATIENT EDUCATION: Education details: Reviewed PWR! Up in sitting, Sitting closed-chain shoulder flexion.  Large amplitude movement strategies for sit>stand and turning in functional context; [facility] PD movement symptoms and diagnosis progress and briefly educated pt in non-motor symptoms associated with PD (issued handout from [facility] and pt given website info as resource) Person educated: Patient Education method: Consulting civil engineer, Media planner, and Verbal cues Education  comprehension: verbalized understanding, returned demonstration, and verbal cues required   HOME EXERCISE PROGRAM: 08/08/22:  PWR! Up in sitting, Sitting closed-chain shoulder flexion, PWR! Hands (basic 4).  Instructed pt in use of large amplitude movements to address bradykinesia and rigidity as well as exercise for brain change.   GOALS: Potential Goals reviewed with patient? Yes  SHORT TERM GOALS: Target date: 08/22/2022    Pt will be independent with PD-specific HEP. Goal status: INITIAL  2.  Pt will verbalize understanding of ways to decr risk of complications related to Parkinsonism. Goal status: INITIAL  3.  Pt will improve coordination/functional reaching for ADLs as shown by improving score on box and blocks test by at least  5 bilaterally. Baseline:  R-45, L-40 blocks Goal status: INITIAL  4.  Pt will demo at least 120* L shoulder flex with at least -30* elbow ext for functional reaching. Baseline:  110* with -40* elbow ext Goal status: INITIAL  5.  Pt will demo at least 110* R shoulder flex with at least -35* elbow ext for functional reaching. Baseline:  110* with -40* elbow ext Goal status: INITIAL  6.  Pt will be able to write at least 3 sentences with no significant decr in size. Goal status: INITIAL  LONG TERM GOALS: Target date: 10/23/2022    Pt will verbalize understanding of AE/strategies to incr ease/safety of ADLs/IADLs and decr risk of future complications (including strategies for cutting food, opening containers, brushing teeth, brushing/washing hair, dressing, cleaning tasks, writing) Goal status: INITIAL  2.  Pt will verbalize understanding of appropriate community resources. Goal status: INITIAL  3.  Further assess vision and establish goal/HEP as appropriate. Goal status: INITIAL  4.  Pt will improve balance/functional reaching for cleaning tasks as shown by improving standing functional reach by at least 3" bilaterally. Baseline:  R-7",  L-5" Goal status: INITIAL  5.  Pt will demo at least 120* L shoulder flex with at least -20* elbow ext for functional reaching. Baseline:  110* with -40* elbow ext Goal status: INITIAL  6.  Pt will demo at least 120* R shoulder flex with at least -30* elbow ext for functional reaching. Baseline:  110* with -40* elbow ext Goal status: INITIAL   ASSESSMENT:  CLINICAL IMPRESSION: Pt responds min-mod cueing for large amplitude movements, but will need continued reinforcement for calibration.  Pt with incr hesitation/bradykinesia with functional mobility today and reports fall last week with facial injuries.    PERFORMANCE DEFICITS in functional skills including ADLs, IADLs, coordination, dexterity, tone, ROM, FMC, GMC, mobility, balance, endurance, decreased knowledge of precautions, decreased knowledge of use of DME, vision, and UE functional use,  and psychosocial skills including environmental adaptation, habits, and routines and behaviors.   IMPAIRMENTS are limiting patient from ADLs, IADLs, and leisure.   COMORBIDITIES may have co-morbidities  that affects occupational performance. Patient will benefit from skilled OT to address above impairments and improve overall function.  MODIFICATION OR ASSISTANCE TO COMPLETE EVALUATION: Min-Moderate modification of tasks or assist with assess necessary to complete an evaluation.  OT OCCUPATIONAL PROFILE AND HISTORY: Detailed assessment: Review of records and additional review of physical, cognitive, psychosocial history related to current functional performance.  CLINICAL DECISION MAKING: Moderate - several treatment options, min-mod task modification necessary  REHAB POTENTIAL: Good  EVALUATION COMPLEXITY: Moderate   PLAN: OT FREQUENCY: 2x/week  OT DURATION: 12 weeks +eval due to scheduling   PLANNED INTERVENTIONS: self care/ADL training, therapeutic exercise, therapeutic activity, neuromuscular re-education, manual therapy, passive  range of motion, balance training, functional mobility training, aquatic therapy, ultrasound, paraffin, fluidotherapy, moist heat, cryotherapy, patient/family education, visual/perceptual remediation/compensation, energy conservation, coping strategies training, and DME and/or AE instructions  RECOMMENDED OTHER SERVICES: scheduled for PT evaluation tomorrow; pt would also benefit from ST evaluation for low voice volume  CONSULTED AND AGREED WITH PLAN OF CARE: Patient  PLAN FOR NEXT SESSION:   continue to work on functional mobility with large amplitude movements for improved safety (sit>stand, turning in functional context, functional ambulation--emphasis on larger base of support and feet staggered for improved wt. Shift and decr bradykinesia/improved initiation of movement),  then coordination HEP with focus on large amplitude movements (PT HAS VERTIGO WITH HEAD/EYE MOVEMENTS TO THE  SIDE or up)   Murat Rideout, OTR/L  08/15/2022, 3:45 PM

## 2022-08-15 ENCOUNTER — Telehealth: Payer: Self-pay | Admitting: Cardiology

## 2022-08-15 ENCOUNTER — Encounter: Payer: Self-pay | Admitting: Occupational Therapy

## 2022-08-15 ENCOUNTER — Ambulatory Visit: Payer: Medicare Other | Admitting: Physical Therapy

## 2022-08-15 ENCOUNTER — Ambulatory Visit: Payer: Medicare Other | Attending: Diagnostic Neuroimaging | Admitting: Occupational Therapy

## 2022-08-15 DIAGNOSIS — R2681 Unsteadiness on feet: Secondary | ICD-10-CM | POA: Diagnosis present

## 2022-08-15 DIAGNOSIS — R293 Abnormal posture: Secondary | ICD-10-CM | POA: Diagnosis present

## 2022-08-15 DIAGNOSIS — R42 Dizziness and giddiness: Secondary | ICD-10-CM | POA: Diagnosis present

## 2022-08-15 DIAGNOSIS — M25621 Stiffness of right elbow, not elsewhere classified: Secondary | ICD-10-CM | POA: Diagnosis present

## 2022-08-15 DIAGNOSIS — R278 Other lack of coordination: Secondary | ICD-10-CM | POA: Diagnosis present

## 2022-08-15 DIAGNOSIS — M25612 Stiffness of left shoulder, not elsewhere classified: Secondary | ICD-10-CM | POA: Diagnosis present

## 2022-08-15 DIAGNOSIS — R29818 Other symptoms and signs involving the nervous system: Secondary | ICD-10-CM | POA: Diagnosis present

## 2022-08-15 DIAGNOSIS — R41842 Visuospatial deficit: Secondary | ICD-10-CM | POA: Insufficient documentation

## 2022-08-15 DIAGNOSIS — M25611 Stiffness of right shoulder, not elsewhere classified: Secondary | ICD-10-CM | POA: Insufficient documentation

## 2022-08-15 DIAGNOSIS — M25622 Stiffness of left elbow, not elsewhere classified: Secondary | ICD-10-CM | POA: Diagnosis present

## 2022-08-15 DIAGNOSIS — R29898 Other symptoms and signs involving the musculoskeletal system: Secondary | ICD-10-CM | POA: Diagnosis present

## 2022-08-15 NOTE — Therapy (Addendum)
OUTPATIENT PHYSICAL THERAPY NEURO TREATMENT   Patient Name: [name] MRN: [id number] DOB:06/14/1946, 76 y.o., female Today's Date: 08/15/2022   PCP: Gordan PaymentGrisso, Greg A., MD REFERRING PROVIDER: Suanne MarkerPenumalli, Vikram R, MD   PT End of Session - 08/15/22 0855     Visit Number 3    Number of Visits 17    Date for PT Re-Evaluation 10/24/22    Authorization Type Medicare    PT Start Time 0852   Handoff w/OT   PT Stop Time 0931    PT Time Calculation (min) 39 min    Equipment Utilized During Treatment Gait belt    Activity Tolerance Patient tolerated treatment well   limited by dizziness   [facility] for tasks assessed/performed              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   Drug induced constipation    Elevated blood pressure reading    Essential hypertension    Fracture    Frequent UTI    Gait abnormality    Generalized osteoarthritis    GERD (gastroesophageal reflux disease)    GERD without esophagitis    Glaucoma    Heel spur    Hemorrhoids    Hip fracture requiring operative repair (HCC) 10/14/2018   Hip fracture requiring operative repair, right, closed, initial encounter (HCC) 10/08/2018   History of Clostridium difficile infection    History of COVID-19    History of fracture of right hip 12/16/2018   History of hemiarthroplasty of right hip 07/14/2019   Hyperglycemia    Hyperlipidemia    Hypertension    Hypoalbuminemia due to protein-calorie malnutrition (HCC)    Irregular bowel habits 06/26/2022   Irritable bowel syndrome 06/26/2022    Leukocytosis    Malaise and fatigue 12/16/2018   Medication intolerance 10/08/2018   Metabolic bone disease    Mixed hyperlipidemia    Moderate episode of recurrent major depressive disorder (HCC)    Muscle weakness    Noncompliance    Nuclear sclerotic cataract of both eyes    Osteoarthritis    Osteoporosis 06/26/2022   Polyosteoarthritis, 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   PONV (postoperative nausea and vomiting)    Post-operative pain    Postoperative pain    Postural dizziness with presyncope    Primary open angle glaucoma of left eye, mild stage    Primary open angle glaucoma of right eye, mild stage    Steroid-induced hyperglycemia    Symptomatic varicose veins, bilateral 06/15/2022   Thrombocytopenia (HCC)    Toenail fungus 08/18/2019   Trochanteric bursitis of right hip 12/04/2021   Upper abdominal pain 06/26/2022   Urinary frequency    Vertigo    Vitamin B12 deficiency    Past Surgical History:  Procedure Laterality Date   APPENDECTOMY     BLADDER SUSPENSION     CERVICAL DISC SURGERY     arthroplasty C3-4   CERVICAL FUSION     3 4 & 5    CHOLECYSTECTOMY     HIP ARTHROPLASTY Right 10/08/2018  Procedure: ARTHROPLASTY BIPOLAR HIP (HEMIARTHROPLASTY);  Surgeon: Altamese Pindall, MD;  Location: [facility];  Service: Orthopedics;  Laterality: Right;   KNEE SURGERY     OVARIAN CYST REMOVAL     SHOULDER SURGERY     TONSILLECTOMY     Patient Active Problem List   Diagnosis Date Noted   Cardiac murmur 07/14/2022   Palpitations 07/14/2022   Arthritis 06/26/2022   Cataract 06/26/2022   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[place]) 96/78/9381   Complication of anesthesia 06/26/2022   DDD (degenerative disc disease), cervical 06/26/2022   Detrusor instability 06/26/2022   Frequent UTI 06/26/2022   Gait abnormality 06/26/2022   GERD (gastroesophageal reflux disease) 06/26/2022   GERD without esophagitis 06/26/2022   Glaucoma 06/26/2022   Heel spur 06/26/2022   Hemorrhoids 06/26/2022   History of COVID-19  06/26/2022   Hyperlipidemia 06/26/2022   Mixed hyperlipidemia 06/26/2022   Moderate episode of recurrent major depressive disorder ([place]) 06/26/2022   Muscle weakness 06/26/2022   Nuclear sclerotic cataract of both eyes 06/26/2022   Osteoarthritis 06/26/2022   PONV (postoperative nausea and vomiting) 06/26/2022   Postural dizziness with presyncope 06/26/2022   Primary open angle glaucoma of left eye, mild stage 06/26/2022   Primary open angle glaucoma of right eye, mild stage 06/26/2022   Vertigo 06/26/2022   Vitamin B12 deficiency 06/26/2022   Candidal vulvovaginitis 06/26/2022   Chronic idiopathic constipation 06/26/2022   Colon cancer screening 06/26/2022   Irregular bowel habits 06/26/2022   Upper abdominal pain 06/26/2022   Irritable bowel syndrome 06/26/2022   Osteoporosis 06/26/2022   Symptomatic varicose veins, bilateral 06/15/2022   Trochanteric bursitis of right hip 12/04/2021   Diverticular disease of colon 01/75/1025   Complication associated with orthopedic device ([place]) 07/18/2021   Toenail fungus 08/18/2019   History of hemiarthroplasty of right hip 07/14/2019   History of fracture of right hip 12/16/2018   Malaise and fatigue 12/16/2018   Hypertension    Elevated blood pressure reading    Hypoalbuminemia due to protein-calorie malnutrition ([place])    Noncompliance    Hip fracture requiring operative repair ([place]) 10/14/2018   Fracture    Hyperglycemia    Urinary frequency    Thrombocytopenia (HCC)    Drug induced constipation    Acute blood loss anemia    Metabolic bone disease    Post-operative pain    Generalized osteoarthritis    Steroid-induced hyperglycemia    Postoperative pain    Leukocytosis    Hip fracture requiring operative repair, right, closed, initial encounter ([place]) 10/08/2018   Medication intolerance 10/08/2018   Essential hypertension    History of Clostridium difficile infection    Polyosteoarthritis, 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 Pt hit above her eyebrow and busted her lip. Did not have her phone on her and could not crawl due to R knee pain, so scooted on her buttocks from her entry way to her living room to call 911. Reports she has been so sore the past week that she did not do much of her exercises. Did try her HEP 1-2x, states that she feels like the exercises she did at [facility] was more helpful.   Pt accompanied by: self  PERTINENT HISTORY: Pt presented to neurology with constellation of problems including gait and balance difficulty, dizziness, weight loss, depression, malaise. DATscan shows signs for parkinsonism. Per Neurology notes:  considering POSTURAL INSTABILITY / GAIT DIFF (? 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  Pt reports that she has not yet started carbidopa/levodopa, but will be starting soon.  (Afraid that she would be nauseous for OT/PT appts)   PMH:  hx of R hip arthroplasty s/p fx, hx of cervical fusion, R hx of knee surgery, hx of L shoulder surgery (RTC repair), HTN, OA, thrombocytopenia, hx of vertigo, glaucoma, cataracts, vitamin B12 deficiency, urinary frequency, osteoporosis, hx of depression, hyperlipidemia, hx of frequent UTI  PAIN:  Are you having pain? Yes: NPRS scale:  5/10 Pain location: R hip/knee Pain description: Sharp Aggravating factors: Stepping down on it. Relieving factors: Staying off it, Exercise, Ice   There were no vitals filed for this visit.  Sitting, Standing   PRECAUTIONS: Fall  PATIENT GOALS To feel better - be able to move, get rid of the tiredness, wants to walk better. Wants to be able to walk on her grass (has a sloped backyard)    OBJECTIVE:    TODAY'S TREATMENT:  NMR  Added to initial HEP (see bolded below) to work on improved vestibular function, turns and body mechanics w/sit <>stands:  - Seated VOR x1 in horizontal and vertical directions, 3x15-45s each direction. Pt more symptomatic w/horizontal direction and could tolerate ~15s prior to needing to stop. Encouraged pt to perform exercise until reaching 2-3/10 symptoms or until reaching 45s. If she is able to perform the full 45s, educated pt on increasing the speed of her head nods until mildly symptomatic. Pt verbalized understanding.   -At counter, practiced proper quarter turns w/BUE support, x10 per side. Mod verbal and visual cues for wider BOS and increased step clearance w/RLE, as pt tends to slide her foot without knowing and stand in romberg stance.   -Practiced x5 sit <>stands from low mat without UE support. Without cues, pt forgot to scoot hips towards edge and braced BLEs against mat to stand. Provided min cues for proper body positioning and pt had more difficulty but was able to perform slowly. Encouraged pt to assume upright posture w/BUE extension/abduction at top of stand to work on immediate standing balance.    Gait pattern:  Absent heel strike bilaterally, step through pattern, decreased arm swing- Right, decreased arm swing- Left, decreased step length- Right, decreased step length- Left, decreased stride length, decreased hip/knee flexion- Right, decreased hip/knee flexion- Left, decreased ankle dorsiflexion- Right, decreased ankle dorsiflexion- Left,  shuffling, narrow BOS, poor foot clearance- Right, and poor foot clearance- Left Distance walked: Various clinic distances  Assistive device utilized: Single point cane and None Level of  assistance: SBA Comments: Min cues to facilitate heel strike and pt able to demonstrate until becoming distracted.     PATIENT EDUCATION: Education details: Debriefed on pt's fall and what potentially caused it. Provided education on freezing and importance of priming lateral weight shift prior to taking a step (lateral sways, marches, etc.). Updates to HEP.  Person educated: Patient Education method: Explanation, Demonstration, and Handouts Education comprehension: verbalized understanding and returned demonstration   HOME EXERCISE PROGRAM: Access Code: [email or web address] URL: [email or web address] Date: 08/15/2022 Prepared by: Alethia Berthold Ahlani Wickes  Exercises - Brandt-Daroff Vestibular Exercise  - 1-2 x daily - 5 x weekly - 2 sets - 3-4 reps - Standing Quarter Turn with Counter Support  - 1 x daily - 7 x weekly - 3 sets - 10 reps - Seated Gaze Stabilization with Head Rotation  - 1 x daily - 7 x weekly - 5-6 reps - 15-30 second hold - Seated Gaze Stabilization with Head Nod  - 1 x daily - 7 x weekly - 5-6 reps - 15-30 second hold - Sit to Stand Without Arm Support  - 1 x daily - 7 x weekly - 3 sets - 6 reps    GOALS: Goals reviewed with patient? Yes  SHORT TERM GOALS: Target date: 08/24/2022  Pt will be independent with initial HEP in order to build upon functional gains made in therapy. Baseline: Goal status: INITIAL  2.  Pt will finish MSQ testing with LTG updated.  Baseline:  Goal status: INITIAL  3.  Pt will improve gait speed with LRAD to at least 1.9 ft/sec in order to demo decr fall risk.   Baseline:  20.38 seconds =  1.61 ft/sec with SPC Goal status: INITIAL  4.  Pt will improve 5x sit<>stand to less than or equal to 21 sec to demonstrate improved functional strength and transfer  efficiency. Baseline: 26.78 seconds with no UE support  Goal status: INITIAL  5.  Pt will improve TUG time to 19 seconds or less with LRAD in order to demo decrease fall risk/improved turns  Baseline: 23.03 seconds Goal status: INITIAL    LONG TERM GOALS: Target date: 09/21/2022  Pt will be independent with final HEP for PD/vestibular deficits in order to build upon functional gains made in therapy. Baseline:  Goal status: INITIAL  2.  Pt will perform all items on MSQ with 1/5 or less in order to demo improved motion sensitivity.  Baseline:  Goal status: INITIAL  3.  Pt will verbalize understanding of local Parkinson's disease resources, including options for continued community fitness. Baseline:  Goal status: INITIAL  4.  Pt will improve gait speed with LRAD to at least 2.3 ft/sec in order to demo improved community mobility. Baseline:  20.38 seconds =  1.61 ft/sec with SPC Goal status: INITIAL  5.  Pt will improve 5x sit<>stand to less than or equal to 18 sec to demonstrate improved functional strength and transfer efficiency. Baseline: 26.78 seconds with no UE support Goal status: INITIAL  6.  Pt will improve cog TUG time with LRAD to 25 seconds or less in order to demo decrease fall risk/improved dual tasking.  Baseline: 34 seconds Goal status: INITIAL  ASSESSMENT:  CLINICAL IMPRESSION: Emphasis of skilled PT session on adding to HEP for improved vestibular function and practicing proper foot placement w/turns and transfers. Pt sustained her first fall last week, likely due to poor foot clearance w/quick turn. Pt demonstrates very narrow BOS and sliding of RLE on ground despite cues. Pt  reports her Brandt-Daroff exercises were slightly helpful, but not as helpful as what she did at [facility]. Will reassess this next session. Continue POC.     OBJECTIVE IMPAIRMENTS Abnormal gait, decreased activity tolerance, decreased balance, decreased coordination, decreased  endurance, decreased mobility, difficulty walking, decreased ROM, decreased strength, dizziness, impaired flexibility, impaired tone, postural dysfunction, and pain.   ACTIVITY LIMITATIONS carrying, lifting, bending, standing, squatting, stairs, transfers, bed mobility, and locomotion level  PARTICIPATION LIMITATIONS: driving, shopping, and community activity  PERSONAL FACTORS Age, Behavior pattern, Past/current experiences, Time since onset of injury/illness/exacerbation, and 3+ comorbidities: hx of R hip arthroplasty s/p fx, hx of cervical fusion, R hx of knee surgery, hx of L shoulder surgery (RTC repair), HTN, OA, thrombocytopenia, hx of vertigo, glaucoma, cataracts, vitamin B12 deficiency, urinary frequency, osteoporosis, hx of depression, hyperlipidemia, hx of frequent UTI  are also affecting patient's functional outcome.   REHAB POTENTIAL: Good  CLINICAL DECISION MAKING: Evolving/moderate complexity  EVALUATION COMPLEXITY: Moderate  PLAN: PT FREQUENCY: 2x/week  PT DURATION: 12 weeks  PLANNED INTERVENTIONS: Therapeutic exercises, Therapeutic activity, Neuromuscular re-education, Balance training, Gait training, Patient/Family education, Self Care, Stair training, Vestibular training, Canalith repositioning, DME instructions, and Manual therapy  PLAN FOR NEXT SESSION: Re-assess positional testing. Update the Brandt-Daroff exercises? I am not sure what she did at [facility] that she likes better. Check DVA/mCTSIB.  Initial HEP for sit <> stands, try some PWR moves, standing balance. Lateral weight shifting    Jill Alexanders Ruston Fedora, PT, DPT  08/15/2022, 9:38 AM

## 2022-08-15 NOTE — Telephone Encounter (Signed)
Pt calling because she has additional questions about echo results

## 2022-08-16 NOTE — Telephone Encounter (Signed)
Pt would like to know if grade 1 diastolic dysfunction could be causing her to become weak at times?

## 2022-08-16 NOTE — Telephone Encounter (Signed)
Recommendations reviewed with pt as per Dr. Revankar's note.  Pt verbalized understanding and had no additional questions.   

## 2022-08-18 ENCOUNTER — Ambulatory Visit: Payer: Medicare Other | Admitting: Speech Pathology

## 2022-08-18 ENCOUNTER — Ambulatory Visit: Payer: Medicare Other | Admitting: Physical Therapy

## 2022-08-21 ENCOUNTER — Ambulatory Visit: Payer: Medicare Other | Admitting: Physical Therapy

## 2022-08-21 ENCOUNTER — Telehealth: Payer: Self-pay | Admitting: Diagnostic Neuroimaging

## 2022-08-21 NOTE — Telephone Encounter (Signed)
Called patient, informed her nothing further from medication standpoint. Continue supportive care (nutrition, safety precautions, walker, PT/OT). Advised she call for concerns before follow up. Patient verbalized understanding, appreciation.

## 2022-08-21 NOTE — Telephone Encounter (Signed)
Called patient who stated she fell last week when her leg gave way from under her. She was unable to walk at the hospital so they admitted her. She's had several weak spells, and her muscles burn with increased activity. She will be getting HH PT/OT, not scheduled yet. She was doing PT/OT at [facility] but had to cancel future appointments since she is getting post hospital in home PT/OT now. She saw cardiology, had ECHO cardiogram on 08/14/22 with unremarkable results. She was unable to tolerate carb/levo, caused urinary retention. She is asking if there is a medication she can take in it's place, what to do about her weakness, muscle burning. I advised her Dr Leta Baptist will recommend she complete all PT/OT at home, will discuss other medication with him and call her back.  Patient verbalized understanding, appreciation. Marland Kitchen

## 2022-08-21 NOTE — Telephone Encounter (Signed)
Pt would like a call to discuss her worsening.  Pt was in the hospital from Thursday to Saturday.  Pt is recovering from a fall in which she busted her knee, injured lip as a result of hitting a cabinet .  Pt would like a call to discuss what Dr Leta Baptist would suggest she do at this point.

## 2022-08-22 ENCOUNTER — Ambulatory Visit: Payer: Medicare Other | Admitting: Physical Therapy

## 2022-08-24 ENCOUNTER — Encounter: Payer: Self-pay | Admitting: Occupational Therapy

## 2022-08-24 NOTE — Therapy (Signed)
[facility] [address] [place], [place], [address] Phone: [phone]   Fax:  [phone]  Patient Details  Name: [name] MRN: [id number] Date of Birth: Apr 12, 1946 Referring Provider:  No ref. provider found  Encounter Date: 08/24/2022  OCCUPATIONAL THERAPY DISCHARGE SUMMARY  Visits from Start of Care: 3  Current functional level related to goals / functional outcomes: All goals not fully addressed/unable to reassess due to pt not return to OT due to change in medical status.   Remaining deficits: bradykinesia, rigidity, decr coordination, abnormal posture, decr balance/functional mobility, decr ROM, decr activity tolerance, possible visual changes.    Education / Equipment: Began education regarding HEP, General PD education, use of large amplitude movements in functional mobility.  However, pt education not completed due to pt not returning to OT.    Patient agrees to discharge. Patient goals were not met. Patient is being discharged due to a change in medical status.  All remaining OT visits were cancelled.  Per epic notes, pt was admitted to hospital s/p fall due to difficulty/decline in mobility.  She will be getting HH PT/OT.   [facility], OTR/L 08/24/2022, 12:59 PM  [facility] [address] [place], [place], [address] Phone: [phone]   Fax:  [phone]

## 2022-08-25 ENCOUNTER — Ambulatory Visit: Payer: Medicare Other | Admitting: Physical Therapy

## 2022-08-28 ENCOUNTER — Ambulatory Visit: Payer: Medicare Other | Admitting: Physical Therapy

## 2022-08-30 ENCOUNTER — Encounter: Payer: Medicare Other | Admitting: Occupational Therapy

## 2022-08-30 ENCOUNTER — Ambulatory Visit: Payer: Medicare Other | Admitting: Physical Therapy

## 2022-09-01 NOTE — Telephone Encounter (Signed)
Pt said hurting and burning all over. Taking Tylenol ,nothing is helping. Is there something I can take? Would like a cal from the nurse.

## 2022-09-04 NOTE — Telephone Encounter (Signed)
Called patient who reported burning all over, staying in bed all day due to weakness. She currently has home health for PT, OT and nursing to assist with ADL's. Has been losing weight due to poor appetite. She asked if this is parkinson's or something else. I advised her will send to Dr Leta Baptist and call her later with his recommendations. Patient verbalized understanding, appreciation.

## 2022-09-05 ENCOUNTER — Ambulatory Visit: Payer: Medicare Other | Admitting: Physical Therapy

## 2022-09-06 NOTE — Telephone Encounter (Addendum)
Spoke with patient and informed Dr Leta Baptist stated no other medication options. Needs supportive care from PCP, family or assisted living.  She had multiple questions, answered to her stated satisfaction. Patient verbalized understanding, appreciation.

## 2022-09-07 ENCOUNTER — Encounter: Payer: Medicare Other | Admitting: Occupational Therapy

## 2022-09-07 ENCOUNTER — Ambulatory Visit: Payer: Medicare Other | Admitting: Physical Therapy

## 2022-09-12 ENCOUNTER — Encounter: Payer: Medicare Other | Admitting: Occupational Therapy

## 2022-09-12 ENCOUNTER — Ambulatory Visit: Payer: Medicare Other | Admitting: Physical Therapy

## 2022-09-19 ENCOUNTER — Encounter: Payer: Medicare Other | Admitting: Occupational Therapy

## 2022-09-22 ENCOUNTER — Encounter: Payer: Medicare Other | Admitting: Occupational Therapy

## 2022-09-26 ENCOUNTER — Encounter: Payer: Medicare Other | Admitting: Occupational Therapy

## 2022-09-28 ENCOUNTER — Encounter: Payer: Medicare Other | Admitting: Occupational Therapy

## 2022-10-03 ENCOUNTER — Encounter: Payer: Medicare Other | Admitting: Occupational Therapy

## 2022-10-09 ENCOUNTER — Encounter: Payer: Medicare Other | Admitting: Occupational Therapy

## 2022-10-11 ENCOUNTER — Encounter: Payer: Medicare Other | Admitting: Occupational Therapy

## 2022-11-20 ENCOUNTER — Ambulatory Visit (INDEPENDENT_AMBULATORY_CARE_PROVIDER_SITE_OTHER): Payer: Medicare Other | Admitting: Diagnostic Neuroimaging

## 2022-11-20 ENCOUNTER — Encounter: Payer: Self-pay | Admitting: Diagnostic Neuroimaging

## 2022-11-20 VITALS — BP 138/81 | HR 92 | Ht 64.0 in | Wt 122.0 lb

## 2022-11-20 DIAGNOSIS — G20C Parkinsonism, unspecified: Secondary | ICD-10-CM | POA: Diagnosis not present

## 2022-11-20 NOTE — Patient Instructions (Signed)
PARKINSONISM --> POSTURAL INSTABILITY / GAIT DIFF (? 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

## 2022-11-20 NOTE — Progress Notes (Signed)
[facility]  PATIENT: [name] DOB: Nov 12, 1946  REFERRING CLINICIAN: Gordan Payment., MD HISTORY FROM: patient  REASON FOR VISIT: follow up   HISTORICAL  CHIEF COMPLAINT:  Chief Complaint  Patient presents with   Follow-up    Pt in room #6 and alone. Pt here today for f/u on her parkinsonism.    HISTORY OF PRESENT ILLNESS:   UPDATE (11/20/22, VRP): Since last visit, doing about the same. Poor appetite and more weight loss. Still using cane / walker.   UPDATE (07/18/22, VRP): Since last visit, here for follow up. DATscan shows signs for parkinsonism. Gait issues continue.   PRIOR HPI (06/12/22): 77 year old female here for evaluation of constellation of problems including gait and balance difficulty, dizziness, weight loss, depression, malaise.  Symptoms started beginning of January 2023.  Noticed that she was sliding her feet, felt off balance, falling down.  Over time she started to lose weight in spite of eating adequately.  However patient and son think she is not eating well.  Has had some issues with enteritis and diarrhea problems.  Has had some generalized malaise, weakness and dizziness.   REVIEW OF SYSTEMS: Full 14 system review of systems performed and negative with exception of: as per HPI.  ALLERGIES: Allergies  Allergen Reactions   Estrogenic Substance Shortness Of Breath    Estrogen cream. Unable to breath per patient   Influenza Vaccines Anaphylaxis   Levofloxacin Other (See Comments)    Any fluoroquinolone drugs give her tendonitis Other Reaction: tendonitis    Tetracycline Shortness Of Breath   Amoxicillin-Pot Clavulanate Other (See Comments), Nausea Only and Nausea And Vomiting   Aspirin Other (See Comments)    Other Reaction: GI Upset    Penicillins Other (See Comments), Nausea Only and Nausea And Vomiting   Prednisone Other (See Comments) and Nausea Only    "I just throw prednisone back up, but Kenalog has never bothered me"     Sulfa Antibiotics Nausea Only and Nausea And Vomiting   Cefaclor Other (See Comments)    GI upset   Cefdinir Diarrhea   Cephalexin Other (See Comments)   Ciprofloxacin Other (See Comments)    GI upset   Dexlansoprazole     Other reaction(s): GI Upset (intolerance), N/V, worsening diarrhea, Other (see comments)   Hydrocodone Nausea And Vomiting    Other reaction(s): appetite loss, Other (see comments), vomiting   Meloxicam Swelling    Tongue swelling   Metoclopramide     Other reaction(s): Not available   Metronidazole Other (See Comments)    Severe headache   Other Other (See Comments)    Any Floxin Drugs - tendonitis    Oxycodone     Other reaction(s): Not available   Oxycodone Hcl     Other reaction(s): stomach upset   Solifenacin Other (See Comments)    Unable to void, had to be catheterized   Sulfamethoxazole     Other reaction(s): Not available   Sulfamethoxazole-Trimethoprim     Other reaction(s): vomiting, vomiting   Tramadol     Other reaction(s): Other (see comments), vomiting   Travoprost     Other reaction(s): Not available   Gentamicin Rash    HOME MEDICATIONS: Outpatient Medications Prior to Visit  Medication Sig Dispense Refill   acetaminophen (TYLENOL) 325 MG tablet Take 1-2 tablets (325-650 mg total) by mouth every 4 (four) hours as needed for mild pain.     calcium carbonate (TUMS - DOSED IN MG ELEMENTAL CALCIUM) 500  MG chewable tablet Chew 2 tablets (400 mg of elemental calcium total) by mouth 2 (two) times daily with a meal.     carbidopa-levodopa (SINEMET IR) 25-100 MG tablet Take 1 tablet by mouth 3 (three) times daily before meals. 90 tablet 6   cholecalciferol (D-VI-SOL) 10 MCG/ML LIQD Take 5 mLs (2,000 Units total) by mouth daily. 150 mL 1   Cyanocobalamin (B-12 COMPLIANCE INJECTION IJ) Inject 1 Dose as directed every 14 (fourteen) days.     diclofenac sodium (VOLTAREN) 1 % GEL Apply 2 g topically 4 (four) times daily. 4 Tube 0   lansoprazole  (PREVACID) 30 MG capsule Take 30 mg by mouth every morning.  11   meclizine (ANTIVERT) 25 MG tablet Take 25 mg by mouth 3 (three) times daily.     Multiple Vitamin (MULTIVITAMIN WITH MINERALS) TABS tablet Take 1 tablet by mouth daily.     No facility-administered medications prior to visit.    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   Drug induced constipation    Elevated blood pressure reading    Essential hypertension    Fracture    Frequent UTI    Gait abnormality    Generalized osteoarthritis    GERD (gastroesophageal reflux disease)    GERD without esophagitis    Glaucoma    Heel spur    Hemorrhoids    Hip fracture requiring operative repair (HCC) 10/14/2018   Hip fracture requiring operative repair, right, closed, initial encounter (HCC) 10/08/2018   History of Clostridium difficile infection    History of COVID-19    History of fracture of right hip 12/16/2018   History of hemiarthroplasty of right hip 07/14/2019   Hyperglycemia    Hyperlipidemia    Hypertension    Hypoalbuminemia due to protein-calorie malnutrition (HCC)    Irregular bowel habits 06/26/2022   Irritable bowel syndrome 06/26/2022   Leukocytosis    Malaise and fatigue 12/16/2018   Medication intolerance 10/08/2018   Metabolic bone disease    Mixed hyperlipidemia    Moderate episode of recurrent major depressive disorder (HCC)    Muscle weakness    Noncompliance    Nuclear sclerotic cataract of both eyes    Osteoarthritis    Osteoporosis 06/26/2022   Polyosteoarthritis,  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   PONV (postoperative nausea and vomiting)    Post-operative pain    Postoperative pain    Postural dizziness with presyncope    Primary open angle glaucoma of left eye, mild stage    Primary open angle glaucoma of right eye, mild stage    Steroid-induced hyperglycemia    Symptomatic varicose veins, bilateral 06/15/2022   Thrombocytopenia (HCC)    Toenail fungus 08/18/2019   Trochanteric bursitis of right hip 12/04/2021   Upper abdominal pain 06/26/2022   Urinary frequency    Vertigo    Vitamin B12 deficiency     PAST SURGICAL HISTORY: Past Surgical History:  Procedure Laterality Date   APPENDECTOMY     BLADDER SUSPENSION     CERVICAL DISC SURGERY  arthroplasty C3-4   CERVICAL FUSION     3 4 & 5    CHOLECYSTECTOMY     HIP ARTHROPLASTY Right 10/08/2018   Procedure: ARTHROPLASTY BIPOLAR HIP (HEMIARTHROPLASTY);  Surgeon: Myrene Galas, MD;  Location: [facility] OR;  Service: Orthopedics;  Laterality: Right;   KNEE SURGERY     OVARIAN CYST REMOVAL     SHOULDER SURGERY     TONSILLECTOMY      FAMILY HISTORY: Family History  Problem Relation Age of Onset   Hypertension Mother    Cancer Mother    Cancer Father    Hypertension Father    Stroke Father    Heart disease Father    High blood pressure Father    Atrial fibrillation Father    Hypertension Brother    High blood pressure Brother     SOCIAL HISTORY: Social History   Socioeconomic History   Marital status: Widowed    Spouse name: Not on file   Number of children: Not on file   Years of education: Not on file   Highest education level: Not on file  Occupational History   Occupation: retired  Tobacco Use   Smoking status: Never   Smokeless tobacco: Never  Vaping Use   Vaping Use: Never used  Substance and Sexual Activity   Alcohol use: Never   Drug use: Never   Sexual activity: Not on file  Other Topics Concern   Not on file  Social History Narrative   Not on file   Social  Determinants of Health   Financial Resource Strain: Not on file  Food Insecurity: Not on file  Transportation Needs: Not on file  Physical Activity: Not on file  Stress: Not on file  Social Connections: Not on file  Intimate Partner Violence: Not on file     PHYSICAL EXAM  GENERAL EXAM/CONSTITUTIONAL: Vitals:  Vitals:   11/20/22 1352  BP: 138/81  Pulse: 92  Weight: 122 lb (55.3 kg)  Height: 5\' 4"  (1.626 m)   Body mass index is 20.94 kg/m. Wt Readings from Last 3 Encounters:  11/20/22 122 lb (55.3 kg)  07/18/22 130 lb (59 kg)  07/14/22 130 lb (59 kg)   Patient is in no distress; well developed, nourished and groomed; neck is supple  CARDIOVASCULAR: Examination of carotid arteries is normal; no carotid bruits Regular rate and rhythm, no murmurs Examination of peripheral vascular system by observation and palpation is normal  EYES: Ophthalmoscopic exam of optic discs and posterior segments is normal; no papilledema or hemorrhages No results found.  MUSCULOSKELETAL: Gait, strength, tone, movements noted in Neurologic exam below  NEUROLOGIC: MENTAL STATUS:      No data to display         awake, alert, oriented to person, place and time recent and remote memory intact normal attention and concentration language fluent, comprehension intact, naming intact fund of knowledge appropriate  CRANIAL NERVE:  2nd - no papilledema on fundoscopic exam 2nd, 3rd, 4th, 6th - pupils equal and reactive to light, visual fields full to confrontation, extraocular muscles intact, no nystagmus 5th - facial sensation symmetric 7th - facial strength symmetric 8th - hearing intact 9th - palate elevates symmetrically, uvula midline 11th - shoulder shrug symmetric 12th - tongue protrusion midline SLIGHTLY SOFT, HOARSE VOICE  MOTOR:  normal bulk and tone, full strength in the BUE, BLE NO TREMOR; NO RIGIDITY BRADYKINESIA IN BUE, BLE  SENSORY:  normal and symmetric to light  touch, temperature, vibration  COORDINATION:  finger-nose-finger,  fine finger movements normal  REFLEXES:  deep tendon reflexes 3+ IN BUE; 2+ IN BLE and symmetric  GAIT/STATION:  narrow based gait; SHORT STEPS; DECR ARM SWING; WALKING ON TOES     DIAGNOSTIC DATA (LABS, IMAGING, TESTING) - I reviewed patient records, labs, notes, testing and imaging myself where available.  Lab Results  Component Value Date   WBC 7.7 10/21/2018   HGB 12.0 10/21/2018   HCT 41.2 10/21/2018   MCV 91.4 10/21/2018   PLT 247 10/21/2018      Component Value Date/Time   NA 140 10/21/2018 0604   K 4.4 10/21/2018 0604   CL 100 10/21/2018 0604   CO2 30 10/21/2018 0604   GLUCOSE 106 (H) 10/21/2018 0604   BUN 18 10/21/2018 0604   CREATININE 0.90 10/21/2018 0604   CALCIUM 9.5 10/21/2018 0604   CALCIUM 8.7 10/12/2018 0338   PROT 6.2 (L) 10/15/2018 0443   ALBUMIN 2.9 (L) 10/15/2018 0443   AST 13 (L) 10/15/2018 0443   ALT 16 10/15/2018 0443   ALKPHOS 71 10/15/2018 0443   BILITOT 0.5 10/15/2018 0443   GFRNONAA >60 10/21/2018 0604   GFRAA >60 10/21/2018 0604   No results found for: "CHOL", "HDL", "LDLCALC", "LDLDIRECT", "TRIG", "CHOLHDL" Lab Results  Component Value Date   HGBA1C 5.2 10/15/2018   No results found for: "VITAMINB12" Lab Results  Component Value Date   TSH 2.802 10/12/2018    04/30/22 MRI brain [I reviewed images myself and agree with interpretation. -VRP]  1. Cerebellar atrophy. 2. Mild for age chronic small vessel ischemia. 3. No reversible finding.  05/18/22 MRI thoracic spine  - No significant spinal canal or neural foraminal stenosis. No abnormal cord signal. - Generalized paraspinal muscle atrophy.  06/27/22 DATsan -Asymmetric decreased radiotracer activity in the is RIGHT striatum. Finding can be associated with Parkinson's syndrome pathology. -Of note, DaTSCAN is not diagnostic of Parkinsonian syndromes, which remains a clinical diagnosis. DaTscan is an adjuvant test  to aid in the clinical diagnosis of Parkinsonian syndromes.   06/19/22 MRI cervical spine Prior ACDF at C3-4 and C4-5.   There is mild degenerative disc disease at C5-6 and small central disc herniation at C2-3.   There is no spinal stenosis, disc herniation or cause for a radiculopathy seen.   Normal cervical spinal cord.    ASSESSMENT AND PLAN  77 y.o. year old female here with:   Dx:  1. Parkinsonism, unspecified Parkinsonism type      PLAN:  PARKINSONISM --> POSTURAL INSTABILITY / GAIT DIFF (? atypical parkinsonism PiGD, MSA-a vs MSA-p) - could not tolerate carb/levo --> side effects of diff urinating and burning all over - PT / OT evaluation  WEIGHT LOSS (due to decreased appetite and decrease PO intake) - follow up with GI and PCP  LIGHTHEADEDNESS (sitting or standing) - could be related to low BP, decreased PO intake, or neurodegenerative (PD vs MSA) - follow up with cardiology  Return for return to PCP.       Penni Bombard, MD 2/0/3559, 7:41 PM Certified in Neurology, Neurophysiology and Neuroimaging  [facility] [address], [place], [place] [address] [phone]

## 2023-01-11 ENCOUNTER — Other Ambulatory Visit: Payer: Self-pay

## 2023-01-15 ENCOUNTER — Encounter: Payer: Self-pay | Admitting: Cardiology

## 2023-01-15 ENCOUNTER — Ambulatory Visit: Payer: Medicare Other | Attending: Cardiology | Admitting: Cardiology

## 2023-01-15 VITALS — BP 132/74 | HR 76 | Ht 64.0 in | Wt 125.4 lb

## 2023-01-15 DIAGNOSIS — E782 Mixed hyperlipidemia: Secondary | ICD-10-CM | POA: Insufficient documentation

## 2023-01-15 DIAGNOSIS — I10 Essential (primary) hypertension: Secondary | ICD-10-CM | POA: Insufficient documentation

## 2023-01-15 NOTE — Progress Notes (Signed)
Cardiology Office Note:    Date:  01/15/2023   ID:  [name], DOB 20-Sep-1946, MRN [id number]  PCP:  Raina Mina., MD  Cardiologist:  Jenean Lindau, MD   Referring MD: Raina Mina., MD    ASSESSMENT:    1. Essential hypertension   2. Mixed hyperlipidemia    PLAN:    In order of problems listed above:  Primary prevention stressed with the patient.  Importance of compliance with diet medication stressed and she vocalized understanding. Palpitations: These were last resolved and she is happy about it.  Monitor report was discussed with her. Aortic atherosclerosis: This will have to be followed by primary care.  She may benefit from lipid-lowering medications but I would leave this decision to her primary care and she will be followed only on a as needed basis.  Patient had multiple questions which were answered to her satisfaction.   Medication Adjustments/Labs and Tests Ordered: Current medicines are reviewed at length with the patient today.  Concerns regarding medicines are outlined above.  No orders of the defined types were placed in this encounter.  No orders of the defined types were placed in this encounter.    No chief complaint on file.    History of Present Illness:    [name] is a 77 y.o. female.  Patient has past medical history of palpitations and mixed dyslipidemia.  She recently has been evaluated for neurologic issues and the possibility of her having Parkinson's.  She denies any chest pain orthopnea or PND.  At the time of my evaluation, the patient is alert awake oriented and in no distress.  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   Drug induced constipation    Elevated blood pressure reading    Essential hypertension    Fracture    Frequent UTI    Gait abnormality    Generalized osteoarthritis    GERD (gastroesophageal reflux disease)    GERD without esophagitis    Glaucoma    Heel spur    Hemorrhoids    Hip fracture requiring operative repair ([place]) 10/14/2018   Hip fracture requiring operative repair, right, closed, initial encounter ([place]) 10/08/2018   History of Clostridium difficile infection    History of COVID-19    History of fracture of right hip 12/16/2018   History of hemiarthroplasty of right hip 07/14/2019   Hyperglycemia    Hyperlipidemia    Hypertension    Hypoalbuminemia due to protein-calorie malnutrition ([place])    Irregular bowel habits 06/26/2022   Irritable bowel syndrome 06/26/2022   Leukocytosis    Malaise and fatigue 12/16/2018   Medication intolerance [id number]   Metabolic bone disease    Mixed hyperlipidemia    Moderate episode of recurrent major depressive disorder (HCC)    Muscle weakness    Noncompliance    Nuclear sclerotic cataract of both eyes    Osteoarthritis    Osteoporosis 06/26/2022   Palpitations 07/14/2022  Parkinson disease 07/27/2022   Polyosteoarthritis, 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   PONV (postoperative nausea and vomiting)    Post-operative pain    Postoperative pain    Postural dizziness with presyncope    Primary open angle glaucoma of left eye, mild stage    Primary open angle glaucoma of right eye, mild stage    Steroid-induced hyperglycemia    Symptomatic varicose veins, bilateral 06/15/2022   Thrombocytopenia (HCC)    Toenail fungus 08/18/2019   Trochanteric bursitis of right hip 12/04/2021   Upper abdominal pain 06/26/2022   Urinary frequency    Vertigo     Vitamin B12 deficiency     Past Surgical History:  Procedure Laterality Date   APPENDECTOMY     BLADDER SUSPENSION     CERVICAL DISC SURGERY     arthroplasty C3-4   CERVICAL FUSION     3 4 & 5    CHOLECYSTECTOMY     HIP ARTHROPLASTY Right 10/08/2018   Procedure: ARTHROPLASTY BIPOLAR HIP (HEMIARTHROPLASTY);  Surgeon: Altamese Metcalf, MD;  Location: [place];  Service: Orthopedics;  Laterality: Right;   KNEE SURGERY     OVARIAN CYST REMOVAL     SHOULDER SURGERY     TONSILLECTOMY      Current Medications: Current Meds  Medication Sig   acetaminophen (TYLENOL) 325 MG tablet Take 1-2 tablets (325-650 mg total) by mouth every 4 (four) hours as needed for mild pain.   calcium carbonate (TUMS - DOSED IN MG ELEMENTAL CALCIUM) 500 MG chewable tablet Chew 2 tablets (400 mg of elemental calcium total) by mouth 2 (two) times daily with a meal.   carbidopa-levodopa (SINEMET IR) 25-100 MG tablet Take 1 tablet by mouth 3 (three) times daily before meals. (Patient taking differently: Take 0.5 tablets by mouth daily.)   cholecalciferol (D-VI-SOL) 10 MCG/ML LIQD Take 5 mLs (2,000 Units total) by mouth daily.   cholestyramine (QUESTRAN) 4 g packet Take 1 packet by mouth once a week.   Cyanocobalamin (B-12 COMPLIANCE INJECTION IJ) Inject 1 Dose as directed every 14 (fourteen) days.   diclofenac sodium (VOLTAREN) 1 % GEL Apply 2 g topically 4 (four) times daily.   lansoprazole (PREVACID) 30 MG capsule Take 30 mg by mouth every morning.   meclizine (ANTIVERT) 25 MG tablet Take 25 mg by mouth 3 (three) times daily.   Multiple Vitamin (MULTIVITAMIN WITH MINERALS) TABS tablet Take 1 tablet by mouth daily.     Allergies:   Estrogenic substance, Influenza vaccines, Levofloxacin, Tetracycline, Amoxicillin-pot clavulanate, Aspirin, Penicillins, Prednisone, Sulfa antibiotics, Cefaclor, Cefdinir, Cephalexin, Ciprofloxacin, Dexlansoprazole, Hydrocodone, Meloxicam, Metoclopramide, Metronidazole, Other, Oxycodone,  Oxycodone hcl, Solifenacin, Sulfamethoxazole, Sulfamethoxazole-trimethoprim, Tramadol, Travoprost, and Gentamicin   Social History   Socioeconomic History   Marital status: Widowed    Spouse name: Not on file   Number of children: Not on file   Years of education: Not on file   Highest education level: Not on file  Occupational History   Occupation: retired  Tobacco Use   Smoking status: Never   Smokeless tobacco: Never  Vaping Use   Vaping Use: Never used  Substance and Sexual Activity   Alcohol use: Never   Drug use: Never   Sexual activity: Not on file  Other Topics Concern   Not on file  Social History Narrative   Not on file   Social Determinants of Health   Financial Resource Strain: Not on file  Food Insecurity: Not on file  Transportation Needs: Not on file  Physical Activity: Not on  file  Stress: Not on file  Social Connections: Not on file     Family History: The patient's family history includes Atrial fibrillation in her father; Cancer in her father and mother; Heart disease in her father; High blood pressure in her brother and father; Hypertension in her brother, father, and mother; Stroke in her father.  ROS:   Please see the history of present illness.    All other systems reviewed and are negative.  EKGs/Labs/Other Studies Reviewed:    The following studies were reviewed today: EKG reveals sinus rhythm and nonspecific ST-T changes. I discussed the findings of the echo and monitor with the patient at length.   Recent Labs: No results found for requested labs within last 365 days.  Recent Lipid Panel No results found for: "CHOL", "TRIG", "HDL", "CHOLHDL", "VLDL", "LDLCALC", "LDLDIRECT"  Physical Exam:    VS:  BP 132/74   Pulse 76   Ht '5\' 4"'$  (1.626 m)   Wt 125 lb 6.4 oz (56.9 kg)   SpO2 97%   BMI 21.52 kg/m     Wt Readings from Last 3 Encounters:  01/15/23 125 lb 6.4 oz (56.9 kg)  11/20/22 122 lb (55.3 kg)  07/18/22 130 lb (59 kg)      GEN: Patient is in no acute distress HEENT: Normal NECK: No JVD; No carotid bruits LYMPHATICS: No lymphadenopathy CARDIAC: Hear sounds regular, 2/6 systolic murmur at the apex. RESPIRATORY:  Clear to auscultation without rales, wheezing or rhonchi  ABDOMEN: Soft, non-tender, non-distended MUSCULOSKELETAL:  No edema; No deformity  SKIN: Warm and dry NEUROLOGIC:  Alert and oriented x 3 PSYCHIATRIC:  Normal affect   Signed, Jenean Lindau, MD  01/15/2023 11:20 AM    [place]

## 2023-01-15 NOTE — Patient Instructions (Signed)
Medication Instructions:  Your physician recommends that you continue on your current medications as directed. Please refer to the Current Medication list given to you today.  *If you need a refill on your cardiac medications before your next appointment, please call your pharmacy*   Lab Work: None ordered If you have labs (blood work) drawn today and your tests are completely normal, you will receive your results only by: [place] (if you have MyChart) OR A paper copy in the mail If you have any lab test that is abnormal or we need to change your treatment, we will call you to review the results.   Testing/Procedures: None ordered   Follow-Up: At [facility], you and your health needs are our priority.  As part of our continuing mission to provide you with exceptional heart care, we have created designated Provider Care Teams.  These Care Teams include your primary Cardiologist (physician) and Advanced Practice Providers (APPs -  Physician Assistants and Nurse Practitioners) who all work together to provide you with the care you need, when you need it.  We recommend signing up for the patient portal called "MyChart".  Sign up information is provided on this After Visit Summary.  MyChart is used to connect with patients for Virtual Visits (Telemedicine).  Patients are able to view lab/test results, encounter notes, upcoming appointments, etc.  Non-urgent messages can be sent to your provider as well.   To learn more about what you can do with MyChart, go to [email or web address].    Your next appointment:   As needed  The format for your next appointment:   In Person  Provider:   Jyl Heinz, MD    Other Instructions none  Important Information About Sugar

## 2023-01-30 ENCOUNTER — Telehealth: Payer: Self-pay | Admitting: Oncology

## 2023-01-30 ENCOUNTER — Inpatient Hospital Stay: Payer: Medicare Other | Attending: Oncology | Admitting: Oncology

## 2023-01-30 ENCOUNTER — Inpatient Hospital Stay: Payer: Medicare Other

## 2023-01-30 ENCOUNTER — Encounter: Payer: Self-pay | Admitting: Oncology

## 2023-01-30 VITALS — BP 137/62 | HR 81 | Resp 16 | Ht 64.0 in | Wt 129.0 lb

## 2023-01-30 DIAGNOSIS — R898 Other abnormal findings in specimens from other organs, systems and tissues: Secondary | ICD-10-CM

## 2023-01-30 DIAGNOSIS — D759 Disease of blood and blood-forming organs, unspecified: Secondary | ICD-10-CM

## 2023-01-30 DIAGNOSIS — I10 Essential (primary) hypertension: Secondary | ICD-10-CM | POA: Diagnosis not present

## 2023-01-30 DIAGNOSIS — D696 Thrombocytopenia, unspecified: Secondary | ICD-10-CM

## 2023-01-30 DIAGNOSIS — Z79899 Other long term (current) drug therapy: Secondary | ICD-10-CM | POA: Insufficient documentation

## 2023-01-30 DIAGNOSIS — D708 Other neutropenia: Secondary | ICD-10-CM | POA: Diagnosis present

## 2023-01-30 DIAGNOSIS — G20A1 Parkinson's disease without dyskinesia, without mention of fluctuations: Secondary | ICD-10-CM | POA: Insufficient documentation

## 2023-01-30 LAB — CBC WITH DIFFERENTIAL (CANCER CENTER ONLY)
Abs Immature Granulocytes: 0.01 10*3/uL (ref 0.00–0.07)
Basophils Absolute: 0 10*3/uL (ref 0.0–0.1)
Basophils Relative: 1 %
Eosinophils Absolute: 0.1 10*3/uL (ref 0.0–0.5)
Eosinophils Relative: 1 %
HCT: 40.1 % (ref 36.0–46.0)
Hemoglobin: 12.6 g/dL (ref 12.0–15.0)
Immature Granulocytes: 0 %
Lymphocytes Relative: 28 %
Lymphs Abs: 1.6 10*3/uL (ref 0.7–4.0)
MCH: 28.6 pg (ref 26.0–34.0)
MCHC: 31.4 g/dL (ref 30.0–36.0)
MCV: 90.9 fL (ref 80.0–100.0)
Monocytes Absolute: 0.5 10*3/uL (ref 0.1–1.0)
Monocytes Relative: 9 %
Neutro Abs: 3.5 10*3/uL (ref 1.7–7.7)
Neutrophils Relative %: 61 %
Platelet Count: DECREASED 10*3/uL (ref 150–400)
RBC: 4.41 MIL/uL (ref 3.87–5.11)
RDW: 14.4 % (ref 11.5–15.5)
WBC Count: 5.8 10*3/uL (ref 4.0–10.5)
nRBC: 0 % (ref 0.0–0.2)

## 2023-01-30 NOTE — Progress Notes (Unsigned)
[facility] Initial Visit:  Patient Care Team: Mayer Camel, NP as PCP - General (Internal Medicine)  CHIEF COMPLAINTS/PURPOSE OF CONSULTATION:   HISTORY OF PRESENTING ILLNESS: [name] 77 y.o. 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  RPR negative Anti-SSA/SSB negative Anti-Smith antibodies negative Lyme titer negative July 05, 2022 B12 [id number]  November 30, 2022: WBC 4.9 hemoglobin 14.4 platelet count could not be assessed due to clumping; 61 segs 25 lymphs 11 monos 2 eos 1 basophil  January 18, 2023: WBC 6.0 hemoglobin 14.0 platelet count could not be assessed because of clumping.;  65 segs 25 lymphs 8 monos 1 EO 1 basophil CMP normal  January 30 2023:  [facility] Hematology Consult  Reviewed results of labs with patient.  She is concerned that she could have leukemis   Review of Systems  Constitutional:  Negative for appetite change, chills, fatigue, fever and unexpected weight change.  HENT:   Negative for lump/mass, mouth sores, nosebleeds, sore throat, tinnitus, trouble swallowing and voice change.        Occasional gingival bleeding at dentist  Eyes:  Negative for eye problems and icterus.       Vision changes:  None  Respiratory:  Negative for chest tightness, hemoptysis, shortness of breath and wheezing.        Chronic dry cough  Cardiovascular:  Negative for chest pain, leg swelling and palpitations.       PND:  none Orthopnea:  none  Gastrointestinal:  Negative for abdominal distention, abdominal pain, blood in stool, constipation, diarrhea, nausea and vomiting.       Stools sometimes dark  Endocrine: Negative for hot flashes.       Cold  intolerance:  none Heat intolerance:  none  Genitourinary:  Negative for bladder incontinence, difficulty urinating, dysuria, frequency, hematuria and nocturia.   Musculoskeletal:  Positive for back pain and gait problem. Negative for myalgias, neck pain and neck stiffness.       Chronic arthralgias in hips and knees  Skin:  Negative for itching, rash and wound.  Neurological:  Positive for gait problem. Negative for dizziness, headaches, light-headedness, numbness and speech difficulty.       Walks with cane and uses rollator  Hematological:  Negative for adenopathy. Bruises/bleeds easily.  Psychiatric/Behavioral:  Negative for suicidal ideas. The patient is not nervous/anxious.     MEDICAL HISTORY: Past Medical History:  Diagnosis Date  . Acute blood loss anemia   . Age-related nuclear cataract of both eyes 10/13/2019  . Arthritis    oa  . Candidal vulvovaginitis 06/26/2022  . Cardiac murmur 07/14/2022  . Cataract   . 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 Chronic idiopathic constipation 06/26/2022  . Colon cancer screening 06/26/2022  . Complication associated with orthopedic device ([place]) 07/18/2021  . Complication of anesthesia   . DDD (degenerative disc disease), cervical   . Detrusor instability   . 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 Drug induced constipation   . Elevated blood pressure reading   . Essential hypertension   . Fracture   . Frequent UTI   . Gait abnormality   . Generalized osteoarthritis   . GERD (gastroesophageal reflux disease)   . GERD without esophagitis   . Glaucoma   . Heel spur   .  Hemorrhoids   . Hip fracture requiring operative repair ([place]) 10/14/2018  . Hip fracture requiring operative repair, right, closed, initial encounter ([place]) 10/08/2018  . History of Clostridium difficile infection   . History of COVID-19   . History of fracture of right hip 12/16/2018  . History of hemiarthroplasty of right hip 07/14/2019  . Hyperglycemia   .  Hyperlipidemia   . Hypertension   . Hypoalbuminemia due to protein-calorie malnutrition ([place])   . Irregular bowel habits 06/26/2022  . Irritable bowel syndrome 06/26/2022  . Leukocytosis   . Malaise and fatigue 12/16/2018  . Medication intolerance 10/08/2018  . Metabolic bone disease   . Mixed hyperlipidemia   . Moderate episode of recurrent major depressive disorder ([place])   . Muscle weakness   . Noncompliance   . Nuclear sclerotic cataract of both eyes   . Osteoarthritis   . Osteoporosis 06/26/2022  . Palpitations 07/14/2022  . Parkinson disease 07/27/2022  . 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 PONV (postoperative nausea and vomiting)   . Post-operative pain   . Postoperative pain   . Postural dizziness with presyncope   . Primary open angle glaucoma of left eye, mild stage   . Primary open angle glaucoma of right eye, mild stage   . Steroid-induced hyperglycemia   . Symptomatic varicose veins, bilateral 06/15/2022  . Thrombocytopenia ([place])   . Toenail fungus 08/18/2019  . Trochanteric bursitis of right hip 12/04/2021  . Upper abdominal pain 06/26/2022  . Urinary frequency   . Vertigo   . Vitamin B12 deficiency     SURGICAL HISTORY: Past Surgical History:  Procedure Laterality Date  . APPENDECTOMY    . BLADDER SUSPENSION    . CERVICAL DISC SURGERY     arthroplasty C3-4  . CERVICAL FUSION     3 4 & 5   . CHOLECYSTECTOMY    . HIP ARTHROPLASTY Right 10/08/2018   Procedure: ARTHROPLASTY BIPOLAR HIP (HEMIARTHROPLASTY);  Surgeon: Altamese McDowell, MD;  Location: [place];  Service: Orthopedics;  Laterality: Right;  . KNEE SURGERY    . OVARIAN CYST REMOVAL    . SHOULDER SURGERY    . TONSILLECTOMY      SOCIAL HISTORY: Social History   Socioeconomic History  . Marital status: Widowed    Spouse name: Not on file  . Number of children: Not on file  . Years of education: Not on file  . Highest education level: Not on file  Occupational History  . Occupation:  retired  Tobacco Use  . Smoking status: Never  . Smokeless tobacco: Never  Vaping Use  . Vaping Use: Never used  Substance and Sexual Activity  . Alcohol use: Never  . Drug use: Never  . Sexual activity: Not on file  Other Topics Concern  . Not on file  Social History Narrative  . Not on file   Social Determinants of Health   Financial Resource Strain: Not on file  Food Insecurity: No Food Insecurity (01/30/2023)   Hunger Vital Sign   . Worried About Charity fundraiser in the Last Year: Never true   . Ran Out of Food in the Last Year: Never true  Transportation Needs: No Transportation Needs (01/30/2023)   PRAPARE - Transportation   . Lack of Transportation (Medical): No   . Lack of Transportation (Non-Medical): No  Physical Activity: Not on file  Stress: Not on file  Social Connections: Not on file  Intimate Partner Violence: Not At Risk (01/30/2023)   Humiliation, Afraid,  Rape, and Kick questionnaire   . Fear of Current or Ex-Partner: No   . Emotionally Abused: No   . Physically Abused: No   . Sexually Abused: No    FAMILY HISTORY Family History  Problem Relation Age of Onset  . Hypertension Mother   . Cancer Mother   . Cancer Father   . Hypertension Father   . Stroke Father   . Heart disease Father   . High blood pressure Father   . Atrial fibrillation Father   . Hypertension Brother   . High blood pressure Brother     ALLERGIES:  is allergic to estrogenic substance, influenza vaccines, levofloxacin, tetracycline, amoxicillin-pot clavulanate, aspirin, penicillins, prednisone, sulfa antibiotics, cefaclor, cefdinir, cephalexin, ciprofloxacin, dexlansoprazole, hydrocodone, meloxicam, metoclopramide, metronidazole, other, oxycodone, oxycodone hcl, solifenacin, sulfamethoxazole, sulfamethoxazole-trimethoprim, tramadol, travoprost, and gentamicin.  MEDICATIONS:  Current Outpatient Medications  Medication Sig Dispense Refill  . acetaminophen (TYLENOL) 325 MG tablet  Take 1-2 tablets (325-650 mg total) by mouth every 4 (four) hours as needed for mild pain.    . calcium carbonate (TUMS - DOSED IN MG ELEMENTAL CALCIUM) 500 MG chewable tablet Chew 2 tablets (400 mg of elemental calcium total) by mouth 2 (two) times daily with a meal.    . carbidopa-levodopa (SINEMET IR) 25-100 MG tablet Take 1 tablet by mouth 3 (three) times daily before meals. (Patient taking differently: Take 0.5 tablets by mouth daily.) 90 tablet 6  . Cholecalciferol (VITAMIN D3) 50 MCG (2000 UT) CAPS Take by mouth.    . Cyanocobalamin (B-12 COMPLIANCE INJECTION IJ) Inject 1 Dose as directed every 14 (fourteen) days.    . diclofenac sodium (VOLTAREN) 1 % GEL Apply 2 g topically 4 (four) times daily. 4 Tube 0  . lactose free nutrition (BOOST) LIQD Take 237 mLs by mouth in the morning and at bedtime.    . lansoprazole (PREVACID) 30 MG capsule Take 30 mg by mouth every morning.  11  . meclizine (ANTIVERT) 25 MG tablet Take 25 mg by mouth 3 (three) times daily.     No current facility-administered medications for this visit.    PHYSICAL EXAMINATION:  ECOG PERFORMANCE STATUS: 1 - Symptomatic but completely ambulatory   Vitals:   01/30/23 1518  BP: 137/62  Pulse: 81  Resp: 16  SpO2: 99%    Filed Weights   01/30/23 1518  Weight: 129 lb (58.5 kg)     Physical Exam Vitals and nursing note reviewed.  Constitutional:      General: She is not in acute distress.    Appearance: Normal appearance. She is normal weight. She is ill-appearing. She is not toxic-appearing or diaphoretic.     Comments: Here alone  HENT:     Head: Normocephalic and atraumatic.     Right Ear: External ear normal.     Left Ear: External ear normal.     Nose: Nose normal. No congestion or rhinorrhea.  Eyes:     General: No scleral icterus.    Extraocular Movements: Extraocular movements intact.     Conjunctiva/sclera: Conjunctivae normal.     Pupils: Pupils are equal, round, and reactive to light.   Cardiovascular:     Rate and Rhythm: Normal rate and regular rhythm.     Heart sounds: Normal heart sounds. No murmur heard.    No friction rub. No gallop.  Pulmonary:     Effort: Pulmonary effort is normal. No respiratory distress.     Breath sounds: Normal breath sounds. No wheezing or rales.  Abdominal:     General: Bowel sounds are normal. There is no distension.     Palpations: Abdomen is soft. There is no mass.     Tenderness: There is no abdominal tenderness.     Hernia: No hernia is present.  Musculoskeletal:        General: No swelling, tenderness or deformity.     Cervical back: Normal range of motion and neck supple. No rigidity or tenderness.  Lymphadenopathy:     Head:     Right side of head: No submental, submandibular, tonsillar, preauricular, posterior auricular or occipital adenopathy.     Left side of head: No submental, submandibular, tonsillar, preauricular, posterior auricular or occipital adenopathy.     Cervical: No cervical adenopathy.     Right cervical: No superficial, deep or posterior cervical adenopathy.    Left cervical: No superficial, deep or posterior cervical adenopathy.     Upper Body:     Right upper body: No supraclavicular, axillary, pectoral or epitrochlear adenopathy.     Left upper body: No supraclavicular, axillary, pectoral or epitrochlear adenopathy.  Skin:    General: Skin is warm.     Coloration: Skin is not jaundiced.     Findings: No bruising or lesion.  Neurological:     General: No focal deficit present.     Mental Status: She is alert and oriented to person, place, and time.     Cranial Nerves: No cranial nerve deficit.  Psychiatric:        Mood and Affect: Mood normal.        Behavior: Behavior normal.        Thought Content: Thought content normal.        Judgment: Judgment normal.    LABORATORY DATA: I have personally reviewed the data as listed:  No visits with results within 1 Month(s) from this visit.  Latest known  visit with results is:  Appointment on 08/14/2022  Component Date Value Ref Range Status  . S' Lateral 08/14/2022 3.00  cm Final  . Area-P 1/2 08/14/2022 2.86  cm2 Final    RADIOGRAPHIC STUDIES: I have personally reviewed the radiological images as listed and agree with the findings in the report  No results found.  ASSESSMENT/PLAN  Cancer Staging  No matching staging information was found for the patient.   No problem-specific Assessment & Plan notes found for this encounter.    No orders of the defined types were placed in this encounter.     minutes was spent in patient care.  This included time spent preparing to see the patient (e.g., review of tests), obtaining and/or reviewing separately obtained history, counseling and educating the patient/family/caregiver, ordering medications, tests, or procedures; documenting clinical information in the electronic or other health record, independently interpreting results and communicating results to the patient/family/caregiver as well as coordination of care.       All questions were answered. The patient knows to call the clinic with any problems, questions or concerns.  This note was electronically signed.    Barbee Cough, MD  01/30/2023 3:32 PM

## 2023-01-30 NOTE — Telephone Encounter (Signed)
01/30/23 Next appt scheduled and confirmed with patient

## 2023-02-01 DIAGNOSIS — R898 Other abnormal findings in specimens from other organs, systems and tissues: Secondary | ICD-10-CM | POA: Insufficient documentation

## 2023-02-06 ENCOUNTER — Encounter: Payer: Self-pay | Admitting: Oncology

## 2023-02-06 ENCOUNTER — Inpatient Hospital Stay (INDEPENDENT_AMBULATORY_CARE_PROVIDER_SITE_OTHER): Payer: Medicare Other | Admitting: Oncology

## 2023-02-06 VITALS — BP 140/69 | HR 72 | Temp 98.2°F | Resp 18 | Ht 64.0 in | Wt 131.5 lb

## 2023-02-06 DIAGNOSIS — R898 Other abnormal findings in specimens from other organs, systems and tissues: Secondary | ICD-10-CM

## 2023-02-06 NOTE — Progress Notes (Signed)
[facility] Follow up Visit:  Patient Care Team: Mayer Camel, NP as PCP - General (Internal Medicine)  CHIEF COMPLAINTS/PURPOSE OF CONSULTATION:   HISTORY OF PRESENTING ILLNESS: [name] 77 y.o. 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  RPR negative Anti-SSA/SSB negative Anti-Smith antibodies negative Lyme titer negative July 05, 2022 [id number] [id number]  November 30, 2022: WBC 4.9 hemoglobin 14.4 platelet count could not be assessed due to clumping; 61 segs 25 lymphs 11 monos 2 eos 1 basophil  January 18, 2023: WBC 6.0 hemoglobin 14.0 platelet count could not be assessed because of clumping.;  65 segs 25 lymphs 8 monos 1 EO 1 basophil CMP normal  January 30 2023:  [facility] Hematology Consult  Reviewed results of labs with patient.  She is concerned that she could have leukemia, I provided reassurance that she does not   Review of Systems  Constitutional:  Negative for appetite change, chills, fatigue, fever and unexpected weight change.  HENT:   Negative for lump/mass, mouth sores, nosebleeds, sore throat, tinnitus, trouble swallowing and voice change.        Occasional gingival bleeding at dentist  Eyes:  Negative for eye problems and icterus.       Vision changes:  None  Respiratory:  Negative for chest tightness, hemoptysis, shortness of breath and wheezing.        Chronic dry cough  Cardiovascular:  Negative for chest pain, leg swelling and palpitations.       PND:  none Orthopnea:  none  Gastrointestinal:  Negative for abdominal distention, abdominal pain, blood in stool, constipation, diarrhea, nausea and vomiting.       Stools sometimes dark   Endocrine: Negative for hot flashes.       Cold intolerance:  none Heat intolerance:  none  Genitourinary:  Negative for bladder incontinence, difficulty urinating, dysuria, frequency, hematuria and nocturia.   Musculoskeletal:  Positive for back pain and gait problem. Negative for myalgias, neck pain and neck stiffness.       Chronic arthralgias in hips and knees  Skin:  Negative for itching, rash and wound.  Neurological:  Positive for gait problem. Negative for dizziness, headaches, light-headedness, numbness and speech difficulty.       Walks with cane and uses rollator  Hematological:  Negative for adenopathy. Bruises/bleeds easily.  Psychiatric/Behavioral:  Negative for suicidal ideas. The patient is not nervous/anxious.     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   Drug induced constipation    Elevated blood pressure reading    Essential hypertension    Fracture    Frequent UTI    Gait abnormality    Generalized osteoarthritis    GERD (gastroesophageal reflux disease)    GERD without esophagitis  Glaucoma    Heel spur    Hemorrhoids    Hip fracture requiring operative repair ([place]) 10/14/2018   Hip fracture requiring operative repair, right, closed, initial encounter ([place]) 10/08/2018   History of Clostridium difficile infection    History of COVID-19    History of fracture of right hip 12/16/2018   History of hemiarthroplasty of right hip 07/14/2019    Hyperglycemia    Hyperlipidemia    Hypertension    Hypoalbuminemia due to protein-calorie malnutrition ([place])    Irregular bowel habits 06/26/2022   Irritable bowel syndrome 06/26/2022   Leukocytosis    Malaise and fatigue 12/16/2018   Medication intolerance Q000111Q   Metabolic bone disease    Mixed hyperlipidemia    Moderate episode of recurrent major depressive disorder (HCC)    Muscle weakness    Noncompliance    Nuclear sclerotic cataract of both eyes    Osteoarthritis    Osteoporosis 06/26/2022   Palpitations 07/14/2022   Parkinson disease 07/27/2022   Polyosteoarthritis, 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   PONV (postoperative nausea and vomiting)    Post-operative pain    Postoperative pain    Postural dizziness with presyncope    Primary open angle glaucoma of left eye, mild stage    Primary open angle glaucoma of right eye, mild stage    Steroid-induced hyperglycemia    Symptomatic varicose veins, bilateral 06/15/2022   Thrombocytopenia (HCC)    Toenail fungus 08/18/2019   Trochanteric bursitis of right hip 12/04/2021   Upper abdominal pain 06/26/2022   Urinary frequency    Vertigo    Vitamin B12 deficiency     SURGICAL HISTORY: Past Surgical History:  Procedure Laterality Date   APPENDECTOMY     BLADDER SUSPENSION     CERVICAL DISC SURGERY     arthroplasty C3-4   CERVICAL FUSION     3 4 & 5    CHOLECYSTECTOMY     HIP ARTHROPLASTY Right 10/08/2018   Procedure: ARTHROPLASTY BIPOLAR HIP (HEMIARTHROPLASTY);  Surgeon: Altamese Coffeeville, MD;  Location: [place];  Service: Orthopedics;  Laterality: Right;   KNEE SURGERY     OVARIAN CYST REMOVAL     SHOULDER SURGERY     TONSILLECTOMY      SOCIAL HISTORY: Social History   Socioeconomic History   Marital status: Widowed    Spouse name: Not on file   Number of children: Not on file   Years of education: Not on file   Highest education level: Not on file  Occupational History   Occupation: retired  Tobacco Use    Smoking status: Never   Smokeless tobacco: Never  Vaping Use   Vaping Use: Never used  Substance and Sexual Activity   Alcohol use: Never   Drug use: Never   Sexual activity: Not on file  Other Topics Concern   Not on file  Social History Narrative   Not on file   Social Determinants of Health   Financial Resource Strain: Not on file  Food Insecurity: No Food Insecurity (01/30/2023)   Hunger Vital Sign    Worried About Running Out of Food in the Last Year: Never true    [place] in the Last Year: Never true  Transportation Needs: No Transportation Needs (01/30/2023)   PRAPARE - Hydrologist (Medical): No    Lack of Transportation (Non-Medical): No  Physical Activity: Not on file  Stress: Not on file  Social Connections: Not on file  Intimate Partner  Violence: Not At Risk (01/30/2023)   Humiliation, Afraid, Rape, and Kick questionnaire    Fear of Current or Ex-Partner: No    Emotionally Abused: No    Physically Abused: No    Sexually Abused: No    FAMILY HISTORY Family History  Problem Relation Age of Onset   Hypertension Mother    Cancer Mother    Cancer Father    Hypertension Father    Stroke Father    Heart disease Father    High blood pressure Father    Atrial fibrillation Father    Hypertension Brother    High blood pressure Brother     ALLERGIES:  is allergic to estrogenic substance, influenza vaccines, levofloxacin, tetracycline, amoxicillin-pot clavulanate, aspirin, penicillins, prednisone, sulfa antibiotics, cefaclor, cefdinir, cephalexin, ciprofloxacin, dexlansoprazole, hydrocodone, meloxicam, metoclopramide, metronidazole, other, oxycodone, oxycodone hcl, solifenacin, sulfamethoxazole, sulfamethoxazole-trimethoprim, tramadol, travoprost, and gentamicin.  MEDICATIONS:  Current Outpatient Medications  Medication Sig Dispense Refill   acetaminophen (TYLENOL) 325 MG tablet Take 1-2 tablets (325-650 mg total) by mouth every 4  (four) hours as needed for mild pain.     calcium carbonate (TUMS - DOSED IN MG ELEMENTAL CALCIUM) 500 MG chewable tablet Chew 2 tablets (400 mg of elemental calcium total) by mouth 2 (two) times daily with a meal.     carbidopa-levodopa (SINEMET IR) 25-100 MG tablet Take 1 tablet by mouth 3 (three) times daily before meals. (Patient taking differently: Take 0.5 tablets by mouth daily.) 90 tablet 6   Cholecalciferol (VITAMIN D3) 50 MCG (2000 UT) CAPS Take by mouth.     Cyanocobalamin (B-12 COMPLIANCE INJECTION IJ) Inject 1 Dose as directed every 14 (fourteen) days.     diclofenac sodium (VOLTAREN) 1 % GEL Apply 2 g topically 4 (four) times daily. 4 Tube 0   lactose free nutrition (BOOST) LIQD Take 237 mLs by mouth in the morning and at bedtime.     lansoprazole (PREVACID) 30 MG capsule Take 30 mg by mouth every morning.  11   meclizine (ANTIVERT) 25 MG tablet Take 25 mg by mouth 3 (three) times daily.     No current facility-administered medications for this visit.    PHYSICAL EXAMINATION:  ECOG PERFORMANCE STATUS: 1 - Symptomatic but completely ambulatory   There were no vitals filed for this visit.   There were no vitals filed for this visit.    Physical Exam Vitals and nursing note reviewed.  Constitutional:      General: She is not in acute distress.    Appearance: Normal appearance. She is normal weight. She is ill-appearing. She is not toxic-appearing or diaphoretic.     Comments: Here alone  HENT:     Head: Normocephalic and atraumatic.     Right Ear: External ear normal.     Left Ear: External ear normal.     Nose: Nose normal. No congestion or rhinorrhea.  Eyes:     General: No scleral icterus.    Extraocular Movements: Extraocular movements intact.     Conjunctiva/sclera: Conjunctivae normal.     Pupils: Pupils are equal, round, and reactive to light.  Cardiovascular:     Rate and Rhythm: Normal rate and regular rhythm.     Heart sounds: Normal heart sounds. No  murmur heard.    No friction rub. No gallop.  Pulmonary:     Effort: Pulmonary effort is normal. No respiratory distress.     Breath sounds: Normal breath sounds. No wheezing or rales.  Abdominal:     General:  Bowel sounds are normal. There is no distension.     Palpations: Abdomen is soft. There is no mass.     Tenderness: There is no abdominal tenderness.     Hernia: No hernia is present.  Musculoskeletal:        General: No swelling, tenderness or deformity.     Cervical back: Normal range of motion and neck supple. No rigidity or tenderness.  Lymphadenopathy:     Head:     Right side of head: No submental, submandibular, tonsillar, preauricular, posterior auricular or occipital adenopathy.     Left side of head: No submental, submandibular, tonsillar, preauricular, posterior auricular or occipital adenopathy.     Cervical: No cervical adenopathy.     Right cervical: No superficial, deep or posterior cervical adenopathy.    Left cervical: No superficial, deep or posterior cervical adenopathy.     Upper Body:     Right upper body: No supraclavicular, axillary, pectoral or epitrochlear adenopathy.     Left upper body: No supraclavicular, axillary, pectoral or epitrochlear adenopathy.  Skin:    General: Skin is warm.     Coloration: Skin is not jaundiced.     Findings: No bruising or lesion.  Neurological:     General: No focal deficit present.     Mental Status: She is alert and oriented to person, place, and time.     Cranial Nerves: No cranial nerve deficit.  Psychiatric:        Mood and Affect: Mood normal.        Behavior: Behavior normal.        Thought Content: Thought content normal.        Judgment: Judgment normal.     LABORATORY DATA: I have personally reviewed the data as listed:  Appointment on 01/30/2023  Component Date Value Ref Range Status   WBC Count 01/30/2023 5.8  4.0 - 10.5 K/uL Final   Comment: REPEATED TO VERIFY WHITE COUNT CONFIRMED ON SMEAR     RBC 01/30/2023 4.41  3.87 - 5.11 MIL/uL Final   Hemoglobin 01/30/2023 12.6  12.0 - 15.0 g/dL Final   HCT 01/30/2023 40.1  36.0 - 46.0 % Final   MCV 01/30/2023 90.9  80.0 - 100.0 fL Final   MCH 01/30/2023 28.6  26.0 - 34.0 pg Final   MCHC 01/30/2023 31.4  30.0 - 36.0 g/dL Final   RDW 01/30/2023 14.4  11.5 - 15.5 % Final   Platelet Count 01/30/2023 PLATELET CLUMPS NOTED ON SMEAR, COUNT APPEARS DECREASED  150 - 400 K/uL Final   Comment: SPECIMEN CHECKED FOR CLOTS Immature Platelet Fraction may be clinically indicated, consider ordering this additional test [id number] REPEATED TO VERIFY    nRBC 01/30/2023 0.0  0.0 - 0.2 % Final   Neutrophils Relative % 01/30/2023 61  % Final   Neutro Abs 01/30/2023 3.5  1.7 - 7.7 K/uL Final   Lymphocytes Relative 01/30/2023 28  % Final   Lymphs Abs 01/30/2023 1.6  0.7 - 4.0 K/uL Final   Monocytes Relative 01/30/2023 9  % Final   Monocytes Absolute 01/30/2023 0.5  0.1 - 1.0 K/uL Final   Eosinophils Relative 01/30/2023 1  % Final   Eosinophils Absolute 01/30/2023 0.1  0.0 - 0.5 K/uL Final   Basophils Relative 01/30/2023 1  % Final   Basophils Absolute 01/30/2023 0.0  0.0 - 0.1 K/uL Final   Immature Granulocytes 01/30/2023 0  % Final   Abs Immature Granulocytes 01/30/2023 0.01  0.00 - 0.07 K/uL Final  Performed at [facility], [place] [address], [place], [place] [address]    RADIOGRAPHIC STUDIES: I have personally reviewed the radiological images as listed and agree with the findings in the report  No results found.  ASSESSMENT/PLAN  77 y.o. 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  Medical history notable for cataracts, osteoarthritis, chronic constipation, GERD, degenerative disc disease, hyperlipidemia, hypertension, glaucoma, Parkinson disease, multiple drug intolerances   Pseudothrombocytopenia:  A relatively uncommon phenomenon in which the number of platelets reported by automated cell counters is much lower than the  real number of platelets circulating in vivo.  Results from platelet clumping in vitro, which may be induced either by (a) antibody-mediated agglutination, the most important causes of which are (EDTA)-dependent agglutination and platelet satellitism or (b) aggregation secondary to platelet activation resulting from improper blood sampling techniques or delayed mixing with anticoagulant in the test tubes (i.e., preanalytical errors).  It is not caused by or associated with a specific disease or use of specific drugs but has been doumented in patients affected by a wide variety of disorders, including autoimmune disease, chronic inflammatory disease, viral and bacterial infections, metabolic syndromes, and neoplastic diseases, as well as after allogeneic stem cell transplantation and in healthy subjects.  It is a completely benign condition and does not pose a hemorrhagic or thrombotic risk to the patient.  It does not require clinical or laboratory monitoring.   Awareness of this phenomenon is important because pseudothrombocytopenia may lead to the erroneous diagnosis of thrombocytopenia, with resultant unnecessary and costly additional laboratory testing and inappropriate treatment.  Evaluation of the platelet count by an automated counter was performed using several different coagulants.  In each case the automated counter registers a low platelet count.  Examination of specimens under the microscope reveals clumping.  This confirms the diagnosis.  In the future if there is concern about the possibility of true thrombocytopenia a blood smear should be evaluated for platelet clumping so as to provide reassurance.    Follow up as needed    Cancer Staging  No matching staging information was found for the patient.   No problem-specific Assessment & Plan notes found for this encounter.    No orders of the defined types were placed in this encounter.   32  minutes was spent in patient care.  This  included time spent preparing to see the patient (e.g., review of tests), obtaining and/or reviewing separately obtained history, counseling and educating the patient/family/caregiver, ordering tests, or procedures; documenting clinical information in the electronic or other health record, independently interpreting results and communicating results to the patient as well as coordination of care.       All questions were answered. The patient knows to call the clinic with any problems, questions or concerns.  This note was electronically signed.    Barbee Cough, MD  02/06/2023 8:36 AM

## 2023-07-03 ENCOUNTER — Ambulatory Visit: Payer: Medicare Other | Attending: Obstetrics and Gynecology | Admitting: Physical Therapy

## 2023-07-03 ENCOUNTER — Other Ambulatory Visit: Payer: Self-pay

## 2023-07-03 DIAGNOSIS — R279 Unspecified lack of coordination: Secondary | ICD-10-CM | POA: Insufficient documentation

## 2023-07-03 DIAGNOSIS — R293 Abnormal posture: Secondary | ICD-10-CM | POA: Insufficient documentation

## 2023-07-03 DIAGNOSIS — M6281 Muscle weakness (generalized): Secondary | ICD-10-CM | POA: Diagnosis not present

## 2023-07-03 NOTE — Patient Instructions (Signed)
BALLOON BREATHING: instead of straining breathing thorough an open first like blowing up a balloon

## 2023-07-03 NOTE — Therapy (Signed)
OUTPATIENT PHYSICAL THERAPY FEMALE PELVIC EVALUATION   Patient Name: TOBIN POZO MRN: [id number] DOB:[birth year], 77 y.o., female Today's Date: 07/03/2023  END OF SESSION:  PT End of Session - 07/03/23 1150     Visit Number 1    Date for PT Re-Evaluation 10/03/23    Authorization Type MCR    PT Start Time 1146    PT Stop Time 1230    PT Time Calculation (min) 44 min    Activity Tolerance Patient tolerated treatment well    Behavior During Therapy [facility] for tasks assessed/performed             Past Medical History:  Diagnosis Date   Acute blood loss anemia    Age-related nuclear cataract of both eyes 10/13/2019   Arthritis    oa   Candidal vulvovaginitis 06/26/2022   Cardiac murmur 07/14/2022   Cataract    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HCC)    Chronic idiopathic constipation 06/26/2022   Colon cancer screening 06/26/2022   Complication associated with orthopedic device (HCC) 07/18/2021   Complication of anesthesia    DDD (degenerative disc disease), cervical    Detrusor instability    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   Drug induced constipation    Elevated blood pressure reading    Essential hypertension    Fracture    Frequent UTI    Gait abnormality    Generalized osteoarthritis    GERD (gastroesophageal reflux disease)    GERD without esophagitis    Glaucoma    Heel spur    Hemorrhoids    Hip fracture requiring operative repair (HCC) 10/14/2018   Hip fracture requiring operative repair, right, closed, initial encounter (HCC) 10/08/2018   History of Clostridium difficile infection    History of COVID-19    History of fracture of right hip 12/16/2018   History of hemiarthroplasty of right hip 07/14/2019   Hyperglycemia    Hyperlipidemia    Hypertension    Hypoalbuminemia due to protein-calorie malnutrition (HCC)    Irregular bowel habits 06/26/2022   Irritable bowel syndrome 06/26/2022   Leukocytosis    Malaise and fatigue 12/16/2018   Medication  intolerance 10/08/2018   Metabolic bone disease    Mixed hyperlipidemia    Moderate episode of recurrent major depressive disorder (HCC)    Muscle weakness    Noncompliance    Nuclear sclerotic cataract of both eyes    Osteoarthritis    Osteoporosis 06/26/2022   Palpitations 07/14/2022   Parkinson disease 07/27/2022   Polyosteoarthritis, 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   PONV (postoperative nausea and vomiting)    Post-operative pain    Postoperative pain    Postural dizziness with presyncope    Primary open angle glaucoma of left eye, mild stage    Primary open angle glaucoma of right eye, mild stage    Steroid-induced hyperglycemia    Symptomatic varicose veins, bilateral 06/15/2022   Thrombocytopenia (HCC)    Toenail fungus 08/18/2019   Trochanteric bursitis of right hip 12/04/2021   Upper abdominal pain 06/26/2022   Urinary frequency    Vertigo    Vitamin B12 deficiency    Past Surgical History:  Procedure Laterality Date   APPENDECTOMY     BLADDER SUSPENSION     CERVICAL DISC SURGERY     arthroplasty C3-4   CERVICAL FUSION     3 4 & 5    CHOLECYSTECTOMY     HIP ARTHROPLASTY Right 10/08/2018   Procedure: ARTHROPLASTY BIPOLAR HIP (HEMIARTHROPLASTY);  Surgeon: Myrene Galas, MD;  Location: [facility] OR;  Service: Orthopedics;  Laterality: Right;   KNEE SURGERY     OVARIAN CYST REMOVAL     SHOULDER SURGERY     TONSILLECTOMY     Patient Active Problem List   Diagnosis Date Noted   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   Parkinson disease 07/27/2022   Cardiac murmur 07/14/2022   Palpitations 07/14/2022   Arthritis 06/26/2022   Cataract 06/26/2022   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   Complication of anesthesia 06/26/2022   DDD (degenerative disc disease), cervical 06/26/2022   Detrusor instability 06/26/2022   Frequent UTI 06/26/2022   Gait abnormality 06/26/2022   GERD (gastroesophageal reflux disease) 06/26/2022   GERD without esophagitis 06/26/2022   Glaucoma 06/26/2022    Heel spur 06/26/2022   Hemorrhoids 06/26/2022   History of COVID-19 06/26/2022   Hyperlipidemia 06/26/2022   Mixed hyperlipidemia 06/26/2022   Moderate episode of recurrent major depressive disorder (HCC) 06/26/2022   Muscle weakness 06/26/2022   Nuclear sclerotic cataract of both eyes 06/26/2022   Osteoarthritis 06/26/2022   Postural dizziness with presyncope 06/26/2022   Primary open angle glaucoma of left eye, mild stage 06/26/2022   Primary open angle glaucoma of right eye, mild stage 06/26/2022   Vertigo 06/26/2022   Vitamin B12 deficiency 06/26/2022   Candidal vulvovaginitis 06/26/2022   Chronic idiopathic constipation 06/26/2022   Colon cancer screening 06/26/2022   Irregular bowel habits 06/26/2022   Upper abdominal pain 06/26/2022   Irritable bowel syndrome 06/26/2022   Osteoporosis 06/26/2022   Symptomatic varicose veins, bilateral 06/15/2022   Trochanteric bursitis of right hip 12/04/2021   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   Complication associated with orthopedic device (HCC) 07/18/2021   Age-related nuclear cataract of both eyes 10/13/2019   Toenail fungus 08/18/2019   History of hemiarthroplasty of right hip 07/14/2019   History of fracture of right hip 12/16/2018   Malaise and fatigue 12/16/2018   Hypertension    Elevated blood pressure reading    Hypoalbuminemia due to protein-calorie malnutrition (HCC)    Noncompliance    Hip fracture requiring operative repair (HCC) 10/14/2018   Fracture    Hyperglycemia    Urinary frequency    Thrombocytopenia (HCC)    Drug induced constipation    Acute blood loss anemia    Metabolic bone disease    Post-operative pain    Generalized osteoarthritis    Steroid-induced hyperglycemia    Postoperative pain    Leukocytosis    Hip fracture requiring operative repair, right, closed, initial encounter (HCC) 10/08/2018   Medication intolerance 10/08/2018   Essential hypertension    History of Clostridium  difficile infection    Polyosteoarthritis, 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   Primary osteoarthritis involving multiple joints 12/27/2015   Degeneration of lumbar intervertebral disc 01/12/2015   Displacement of lumbar intervertebral disc without myelopathy 07/16/2014   Brachial radiculitis 12/04/2013    PCP: Krystal Clark, NP  REFERRING PROVIDER: Harold Hedge, MD  REFERRING DIAG: N81.4 (ICD-10-CM) - Uterovaginal prolapse, unspecified   THERAPY DIAG:  Muscle weakness (generalized)  Abnormal posture  Unspecified lack of coordination  Rationale for Evaluation and Treatment: Rehabilitation  ONSET DATE: several years  SUBJECTIVE:  SUBJECTIVE STATEMENT: Had bladder sling ~15 years ago per pt, and now reports bladder and uterus has "dropped". Does note increased urinary frequency at night.   Fluid intake: Yes: water - 8 glasses per day and one boost and one large bottle with electrolyte     PAIN:  Are you having pain? No   PRECAUTIONS: None  RED FLAGS: None   WEIGHT BEARING RESTRICTIONS: No  FALLS:  Has patient fallen in last 6 months? No  LIVING ENVIRONMENT: Lives with: lives alone Lives in: House/apartment   OCCUPATION: retired   PLOF: Independent with household mobility with device and Needs assistance with homemaking  PATIENT GOALS: to have stronger pelvic floor   PERTINENT HISTORY:  Pseduothrombocytopenia,   cataracts, osteoarthritis, chronic constipation, GERD, degenerative disc disease, hyperlipidemia, hypertension, glaucoma, Parkinson disease, multiple drug intolerances Sexual abuse: No  BOWEL MOVEMENT: Pain with bowel movement: No Type of bowel movement:Type (Bristol Stool Scale) 2-4, Frequency 3-4x day, and Strain Yes Fully empty rectum: No Leakage: No Pads:  No Fiber supplement: Yes: miralax daily  URINATION: Pain with urination: No Fully empty bladder: No Stream: Strong and Weak Urgency: No Frequency: every 2 hours during the day, several times at night Leakage:  urge with waiting too long Pads: No  INTERCOURSE: Pain with intercourse:  not active  PREGNANCY: Vaginal deliveries 2 C-section deliveries 0 Currently pregnant No  PROLAPSE: None   OBJECTIVE:   DIAGNOSTIC FINDINGS:    COGNITION: Overall cognitive status: Within functional limits for tasks assessed     SENSATION: Light touch: Appears intact Proprioception: Appears intact  MUSCLE LENGTH: Bil hamstrings and adductors limited by 25% 5XSTS unable to complete at eval with fatigue 3 REPS and need of bil UE support  GAIT: Distance walked: 150' Assistive device utilized: Single point cane Level of assistance: Modified independence Comments: slowed gait cadence, decreased step height, decreased stride length, decreased arm swing  POSTURE: rounded shoulders and forward head  PELVIC ALIGNMENT: WFL  LUMBARAROM/PROM:  A/PROM A/PROM  eval  Flexion Limited by 25%  Extension WFL  Right lateral flexion Limited by 50%  Left lateral flexion Limited by 50%  Right rotation Limited by 50%  Left rotation Limited by 50%   (Blank rows = not tested)  LOWER EXTREMITY ROM:  WFL  LOWER EXTREMITY MMT:  Bil hips grossly 3+/5, knees 4/5  PALPATION:   General  fascial restrictions throughout abdomen, no TTP, tight lumbar paraspinals and gluteals                External Perineal Exam atrophy noted, mild redness                             Internal Pelvic Floor no TTP  Patient confirms identification and approves PT to assess internal pelvic floor and treatment Yes No emotional/communication barriers or cognitive limitation. Patient is motivated to learn. Patient understands and agrees with treatment goals and plan. PT explains patient will be examined in standing,  sitting, and lying down to see how their muscles and joints work. When they are ready, they will be asked to remove their underwear so PT can examine their perineum. The patient is also given the option of providing their own chaperone as one is not provided in our facility. The patient also has the right and is explained the right to defer or refuse any part of the evaluation or treatment including the internal exam. With the patient's consent, PT will use  one gloved finger to gently assess the muscles of the pelvic floor, seeing how well it contracts and relaxes and if there is muscle symmetry. After, the patient will get dressed and PT and patient will discuss exam findings and plan of care. PT and patient discuss plan of care, schedule, attendance policy and HEP activities.   PELVIC MMT:   MMT eval  Vaginal 3/5, 3s, 5 reps  Internal Anal Sphincter   External Anal Sphincter   Puborectalis   Diastasis Recti   (Blank rows = not tested)        TONE: Decreased   PROLAPSE: Possible grade 2 anterior wall laxity with   TODAY'S TREATMENT:                                                                                                                              DATE:   07/03/23 EVAL Examination completed, findings reviewed, pt educated on POC, HEP, and abdominal massage, voiding mechanics. Pt motivated to participate in PT and agreeable to attempt recommendations.     PATIENT EDUCATION:  Education details: [id number] Person educated: Patient Education method: Explanation, Demonstration, Tactile cues, Verbal cues, and Handouts Education comprehension: verbalized understanding and returned demonstration  HOME EXERCISE PROGRAM: [email or web address]  ASSESSMENT:  CLINICAL IMPRESSION: Patient is a 77 y.o. female  who was seen today for physical therapy evaluation and treatment for prolapse. Pt has complex medical history with PD, chronic constipation, OA and history of bladder sling per pt. Pt found to  have impaired posture, gait mechanics, and decreased flexibility at hips and spine, decreased hip and core strength. Patient consented to internal pelvic floor assessment vaginally this date and found to have decreased strength, endurance, and coordination and did note anterior vaginal wall laxity. Pt able to complete pelvic floor contraction with cough which did improve this but needed cues. Pt would benefit from additional PT to further address deficits.        OBJECTIVE IMPAIRMENTS: decreased activity tolerance, decreased balance, decreased coordination, decreased endurance, decreased mobility, decreased strength, increased fascial restrictions, increased muscle spasms, impaired flexibility, impaired sensation, improper body mechanics, and postural dysfunction.   ACTIVITY LIMITATIONS: continence and locomotion level  PARTICIPATION LIMITATIONS: community activity  PERSONAL FACTORS: Time since onset of injury/illness/exacerbation and 1 comorbidity: medical history  are also affecting patient's functional outcome.   REHAB POTENTIAL: Good  CLINICAL DECISION MAKING: Evolving/moderate complexity  EVALUATION COMPLEXITY: Moderate   GOALS: Goals reviewed with patient? Yes  SHORT TERM GOALS: Target date: 07/25/23  Pt to be I with HEP.  Baseline: Goal status: INITIAL  2.  Pt to be I with breathing and voiding mechanics for decreased strain at pelvic floor.  Baseline:  Goal status: INITIAL  3.  Pt to be I with urge drill for decreased urinary leakage Goal status: INITIAL   LONG TERM GOALS: Target date: 10/03/23  Pt to be I with advanced HEP.  Baseline:  Goal status: INITIAL  2.  Pt  to demonstrate at least 4/5 pelvic floor strength for improved pelvic stability and decreased strain at pelvic floor/ decrease leakage.  Baseline:  Goal status: INITIAL  3.  Pt to demonstrate at least 5/5 bil hip strength for improved pelvic stability.  Baseline:  Goal status: INITIAL  4.  Pt to  demonstrate improved 5xSTS to  no more than 23s for decreased fall risk Baseline:  Goal status: INITIAL  5.  Pt to demonstrate improved coordination of pelvic floor and breathing mechanics with body weight Sit to stand  with appropriate synergistic patterns to decrease pain and leakage and strain at pelvic floor  at least 75% of the time.    Baseline:  Goal status: INITIAL   PLAN:  PT FREQUENCY: 1x/week  PT DURATION:  8 sessions  PLANNED INTERVENTIONS: Therapeutic exercises, Therapeutic activity, Neuromuscular re-education, Balance training, Gait training, Patient/Family education, Self Care, Joint mobilization, DME instructions, Aquatic Therapy, Dry Needling, Cryotherapy, Moist heat, Manual lymph drainage, scar mobilization, Taping, Vasopneumatic device, Ultrasound, Biofeedback, Manual therapy, and Re-evaluation  PLAN FOR NEXT SESSION: coordination of pelvic floor and breathing with and without exercise, core and hip strengthening, gait mechanics, posture strengthening, breathing mechanics, voiding mechanics, lifting mechanics   Otelia Sergeant, PT, DPT 08/20/244:37 PM

## 2023-07-12 ENCOUNTER — Ambulatory Visit: Payer: Medicare Other | Admitting: Physical Therapy

## 2023-07-12 DIAGNOSIS — R293 Abnormal posture: Secondary | ICD-10-CM

## 2023-07-12 DIAGNOSIS — M6281 Muscle weakness (generalized): Secondary | ICD-10-CM | POA: Diagnosis not present

## 2023-07-12 DIAGNOSIS — R279 Unspecified lack of coordination: Secondary | ICD-10-CM

## 2023-07-12 NOTE — Therapy (Signed)
OUTPATIENT PHYSICAL THERAPY FEMALE PELVIC EVALUATION   Patient Name: [name] MRN: [id number] DOB:[birth year], 77 y.o., female Today's Date: 07/12/2023  END OF SESSION:  PT End of Session - 07/12/23 1101     Visit Number 2    Date for PT Re-Evaluation 10/03/23    Authorization Type MCR    Progress Note Due on Visit 10    PT Start Time 1100    PT Stop Time 1139    PT Time Calculation (min) 39 min    Activity Tolerance Patient tolerated treatment well    Behavior During Therapy [facility] for tasks assessed/performed             Past Medical History:  Diagnosis Date   Acute blood loss anemia    Age-related nuclear cataract of both eyes 10/13/2019   Arthritis    oa   Candidal vulvovaginitis 06/26/2022   Cardiac murmur 07/14/2022   Cataract    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HCC)    Chronic idiopathic constipation 06/26/2022   Colon cancer screening 06/26/2022   Complication associated with orthopedic device (HCC) 07/18/2021   Complication of anesthesia    DDD (degenerative disc disease), cervical    Detrusor instability    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   Drug induced constipation    Elevated blood pressure reading    Essential hypertension    Fracture    Frequent UTI    Gait abnormality    Generalized osteoarthritis    GERD (gastroesophageal reflux disease)    GERD without esophagitis    Glaucoma    Heel spur    Hemorrhoids    Hip fracture requiring operative repair (HCC) 10/14/2018   Hip fracture requiring operative repair, right, closed, initial encounter (HCC) 10/08/2018   History of Clostridium difficile infection    History of COVID-19    History of fracture of right hip 12/16/2018   History of hemiarthroplasty of right hip 07/14/2019   Hyperglycemia    Hyperlipidemia    Hypertension    Hypoalbuminemia due to protein-calorie malnutrition (HCC)    Irregular bowel habits 06/26/2022   Irritable bowel syndrome 06/26/2022   Leukocytosis    Malaise and  fatigue 12/16/2018   Medication intolerance 10/08/2018   Metabolic bone disease    Mixed hyperlipidemia    Moderate episode of recurrent major depressive disorder (HCC)    Muscle weakness    Noncompliance    Nuclear sclerotic cataract of both eyes    Osteoarthritis    Osteoporosis 06/26/2022   Palpitations 07/14/2022   Parkinson disease 07/27/2022   Polyosteoarthritis, 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   PONV (postoperative nausea and vomiting)    Post-operative pain    Postoperative pain    Postural dizziness with presyncope    Primary open angle glaucoma of left eye, mild stage    Primary open angle glaucoma of right eye, mild stage    Steroid-induced hyperglycemia    Symptomatic varicose veins, bilateral 06/15/2022   Thrombocytopenia (HCC)    Toenail fungus 08/18/2019   Trochanteric bursitis of right hip 12/04/2021   Upper abdominal pain 06/26/2022   Urinary frequency    Vertigo    Vitamin B12 deficiency    Past Surgical History:  Procedure Laterality Date   APPENDECTOMY     BLADDER SUSPENSION     CERVICAL DISC SURGERY     arthroplasty C3-4   CERVICAL FUSION     3 4 & 5    CHOLECYSTECTOMY     HIP ARTHROPLASTY Right 10/08/2018   Procedure:  ARTHROPLASTY BIPOLAR HIP (HEMIARTHROPLASTY);  Surgeon: Myrene Galas, MD;  Location: [facility] OR;  Service: Orthopedics;  Laterality: Right;   KNEE SURGERY     OVARIAN CYST REMOVAL     SHOULDER SURGERY     TONSILLECTOMY     Patient Active Problem List   Diagnosis Date Noted   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   Parkinson disease 07/27/2022   Cardiac murmur 07/14/2022   Palpitations 07/14/2022   Arthritis 06/26/2022   Cataract 06/26/2022   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   Complication of anesthesia 06/26/2022   DDD (degenerative disc disease), cervical 06/26/2022   Detrusor instability 06/26/2022   Frequent UTI 06/26/2022   Gait abnormality 06/26/2022   GERD (gastroesophageal reflux disease) 06/26/2022   GERD without esophagitis  06/26/2022   Glaucoma 06/26/2022   Heel spur 06/26/2022   Hemorrhoids 06/26/2022   History of COVID-19 06/26/2022   Hyperlipidemia 06/26/2022   Mixed hyperlipidemia 06/26/2022   Moderate episode of recurrent major depressive disorder (HCC) 06/26/2022   Muscle weakness 06/26/2022   Nuclear sclerotic cataract of both eyes 06/26/2022   Osteoarthritis 06/26/2022   Postural dizziness with presyncope 06/26/2022   Primary open angle glaucoma of left eye, mild stage 06/26/2022   Primary open angle glaucoma of right eye, mild stage 06/26/2022   Vertigo 06/26/2022   Vitamin B12 deficiency 06/26/2022   Candidal vulvovaginitis 06/26/2022   Chronic idiopathic constipation 06/26/2022   Colon cancer screening 06/26/2022   Irregular bowel habits 06/26/2022   Upper abdominal pain 06/26/2022   Irritable bowel syndrome 06/26/2022   Osteoporosis 06/26/2022   Symptomatic varicose veins, bilateral 06/15/2022   Trochanteric bursitis of right hip 12/04/2021   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   Complication associated with orthopedic device (HCC) 07/18/2021   Age-related nuclear cataract of both eyes 10/13/2019   Toenail fungus 08/18/2019   History of hemiarthroplasty of right hip 07/14/2019   History of fracture of right hip 12/16/2018   Malaise and fatigue 12/16/2018   Hypertension    Elevated blood pressure reading    Hypoalbuminemia due to protein-calorie malnutrition (HCC)    Noncompliance    Hip fracture requiring operative repair (HCC) 10/14/2018   Fracture    Hyperglycemia    Urinary frequency    Thrombocytopenia (HCC)    Drug induced constipation    Acute blood loss anemia    Metabolic bone disease    Post-operative pain    Generalized osteoarthritis    Steroid-induced hyperglycemia    Postoperative pain    Leukocytosis    Hip fracture requiring operative repair, right, closed, initial encounter (HCC) 10/08/2018   Medication intolerance 10/08/2018   Essential hypertension     History of Clostridium difficile infection    Polyosteoarthritis, 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   Primary osteoarthritis involving multiple joints 12/27/2015   Degeneration of lumbar intervertebral disc 01/12/2015   Displacement of lumbar intervertebral disc without myelopathy 07/16/2014   Brachial radiculitis 12/04/2013    PCP: Krystal Clark, NP  REFERRING PROVIDER: Harold Hedge, MD  REFERRING DIAG: N81.4 (ICD-10-CM) - Uterovaginal prolapse, unspecified   THERAPY DIAG:  Muscle weakness (generalized)  Abnormal posture  Unspecified lack of coordination  Rationale for Evaluation and Treatment: Rehabilitation  ONSET DATE: several years  SUBJECTIVE:  SUBJECTIVE STATEMENT: I have questions about the homework to make sure I am doing it right. I am unable to use the step stool due to hip pain. Balloon breathing was helpful. Has been doing abdominal massage nightly. Also urologist told me to start aloe capsules and these have been helping the bladder.  Fluid intake: Yes: water - 8 glasses per day and one boost and one large bottle with electrolyte     PAIN:  Are you having pain? No   PRECAUTIONS: Fall  RED FLAGS: None   WEIGHT BEARING RESTRICTIONS: No  FALLS:  Has patient fallen in last 6 months? No  LIVING ENVIRONMENT: Lives with: lives alone Lives in: House/apartment   OCCUPATION: retired   PLOF: Independent with household mobility with device and Needs assistance with homemaking  PATIENT GOALS: to have stronger pelvic floor   PERTINENT HISTORY:  Pseduothrombocytopenia,   cataracts, osteoarthritis, chronic constipation, GERD, degenerative disc disease, hyperlipidemia, hypertension, glaucoma, Parkinson disease, multiple drug intolerances Sexual abuse: No  BOWEL  MOVEMENT: Pain with bowel movement: No Type of bowel movement:Type (Bristol Stool Scale) 2-4, Frequency 3-4x day, and Strain Yes Fully empty rectum: No Leakage: No Pads: No Fiber supplement: Yes: miralax daily and fiber 2x day  URINATION: Pain with urination: No Fully empty bladder: No Stream: Strong and Weak Urgency: No Frequency: every 2 hours during the day, several times at night Leakage:  urge with waiting too long Pads: No  INTERCOURSE: Pain with intercourse:  not active  PREGNANCY: Vaginal deliveries 2 C-section deliveries 0 Currently pregnant No  PROLAPSE: None   OBJECTIVE:   DIAGNOSTIC FINDINGS:    COGNITION: Overall cognitive status: Within functional limits for tasks assessed     SENSATION: Light touch: Appears intact Proprioception: Appears intact  MUSCLE LENGTH: Bil hamstrings and adductors limited by 25% 5XSTS unable to complete at eval with fatigue 3 REPS and need of bil UE support  GAIT: Distance walked: 150' Assistive device utilized: Single point cane Level of assistance: Modified independence Comments: slowed gait cadence, decreased step height, decreased stride length, decreased arm swing  POSTURE: rounded shoulders and forward head  PELVIC ALIGNMENT: WFL  LUMBARAROM/PROM:  A/PROM A/PROM  eval  Flexion Limited by 25%  Extension WFL  Right lateral flexion Limited by 50%  Left lateral flexion Limited by 50%  Right rotation Limited by 50%  Left rotation Limited by 50%   (Blank rows = not tested)  LOWER EXTREMITY ROM:  WFL  LOWER EXTREMITY MMT:  Bil hips grossly 3+/5, knees 4/5  PALPATION:   General  fascial restrictions throughout abdomen, no TTP, tight lumbar paraspinals and gluteals                External Perineal Exam atrophy noted, mild redness                             Internal Pelvic Floor no TTP  Patient confirms identification and approves PT to assess internal pelvic floor and treatment Yes No  emotional/communication barriers or cognitive limitation. Patient is motivated to learn. Patient understands and agrees with treatment goals and plan. PT explains patient will be examined in standing, sitting, and lying down to see how their muscles and joints work. When they are ready, they will be asked to remove their underwear so PT can examine their perineum. The patient is also given the option of providing their own chaperone as one is not provided in our facility. The patient  also has the right and is explained the right to defer or refuse any part of the evaluation or treatment including the internal exam. With the patient's consent, PT will use one gloved finger to gently assess the muscles of the pelvic floor, seeing how well it contracts and relaxes and if there is muscle symmetry. After, the patient will get dressed and PT and patient will discuss exam findings and plan of care. PT and patient discuss plan of care, schedule, attendance policy and HEP activities.   PELVIC MMT:   MMT eval  Vaginal 3/5, 3s, 5 reps  Internal Anal Sphincter   External Anal Sphincter   Puborectalis   Diastasis Recti   (Blank rows = not tested)        TONE: Decreased   PROLAPSE: Possible grade 2 anterior wall laxity with   TODAY'S TREATMENT:                                                                                                                              DATE:   07/12/23  Urge drill handout given and educated on X10 diaphragmatic breathing  X10 diaphragmatic breathing +pelvic floor contraction 2x10 ball squeezes with pelvic floor contraction 2x10 seated ball press for abdominal press   PATIENT EDUCATION:  Education details: [email or web address] Person educated: Patient Education method: Explanation, Demonstration, Tactile cues, Verbal cues, and Handouts Education comprehension: verbalized understanding and returned demonstration  HOME EXERCISE PROGRAM: [email or web address]  ASSESSMENT:  CLINICAL  IMPRESSION: Patient has been doing HEP, has been seeing slight improvement. Session focused on review of HEP, initiating gentle core strengthening and coordination with pelvic floor. Pt also educated on urge drill. Pt would benefit from additional PT to further address deficits.        OBJECTIVE IMPAIRMENTS: decreased activity tolerance, decreased balance, decreased coordination, decreased endurance, decreased mobility, decreased strength, increased fascial restrictions, increased muscle spasms, impaired flexibility, impaired sensation, improper body mechanics, and postural dysfunction.   ACTIVITY LIMITATIONS: continence and locomotion level  PARTICIPATION LIMITATIONS: community activity  PERSONAL FACTORS: Time since onset of injury/illness/exacerbation and 1 comorbidity: medical history  are also affecting patient's functional outcome.   REHAB POTENTIAL: Good  CLINICAL DECISION MAKING: Evolving/moderate complexity  EVALUATION COMPLEXITY: Moderate   GOALS: Goals reviewed with patient? Yes  SHORT TERM GOALS: Target date: 07/25/23  Pt to be I with HEP.  Baseline: Goal status: INITIAL  2.  Pt to be I with breathing and voiding mechanics for decreased strain at pelvic floor.  Baseline:  Goal status: INITIAL  3.  Pt to be I with urge drill for decreased urinary leakage Goal status: INITIAL   LONG TERM GOALS: Target date: 10/03/23  Pt to be I with advanced HEP.  Baseline:  Goal status: INITIAL  2.  Pt to demonstrate at least 4/5 pelvic floor strength for improved pelvic stability and decreased strain at pelvic floor/ decrease leakage.  Baseline:  Goal status: INITIAL  3.  Pt to demonstrate  at least 5/5 bil hip strength for improved pelvic stability.  Baseline:  Goal status: INITIAL  4.  Pt to demonstrate improved 5xSTS to  no more than 23s for decreased fall risk Baseline:  Goal status: INITIAL  5.  Pt to demonstrate improved coordination of pelvic floor and breathing  mechanics with body weight Sit to stand  with appropriate synergistic patterns to decrease pain and leakage and strain at pelvic floor  at least 75% of the time.    Baseline:  Goal status: INITIAL   PLAN:  PT FREQUENCY: 1x/week  PT DURATION:  8 sessions  PLANNED INTERVENTIONS: Therapeutic exercises, Therapeutic activity, Neuromuscular re-education, Balance training, Gait training, Patient/Family education, Self Care, Joint mobilization, DME instructions, Aquatic Therapy, Dry Needling, Cryotherapy, Moist heat, Manual lymph drainage, scar mobilization, Taping, Vasopneumatic device, Ultrasound, Biofeedback, Manual therapy, and Re-evaluation  PLAN FOR NEXT SESSION: coordination of pelvic floor and breathing with and without exercise, core and hip strengthening, gait mechanics, posture strengthening, breathing mechanics, voiding mechanics, lifting mechanics   Otelia Sergeant, PT, DPT 08/29/241:12 PM

## 2023-07-12 NOTE — Patient Instructions (Signed)

## 2023-07-24 ENCOUNTER — Ambulatory Visit: Payer: Medicare Other | Attending: Obstetrics and Gynecology | Admitting: Physical Therapy

## 2023-07-24 DIAGNOSIS — R279 Unspecified lack of coordination: Secondary | ICD-10-CM | POA: Diagnosis present

## 2023-07-24 DIAGNOSIS — M6281 Muscle weakness (generalized): Secondary | ICD-10-CM | POA: Insufficient documentation

## 2023-07-24 DIAGNOSIS — R293 Abnormal posture: Secondary | ICD-10-CM | POA: Diagnosis present

## 2023-07-24 NOTE — Therapy (Signed)
OUTPATIENT PHYSICAL THERAPY FEMALE PELVIC EVALUATION   Patient Name: CAMELA RYNER MRN: [id number] DOB:24-Mar-1946, 77 y.o., female Today's Date: 07/24/2023  END OF SESSION:  PT End of Session - 07/24/23 1103     Visit Number 3    Date for PT Re-Evaluation 10/03/23    Authorization Type MCR    Progress Note Due on Visit 10    PT Start Time 1100    PT Stop Time 1140    PT Time Calculation (min) 40 min    Activity Tolerance Patient tolerated treatment well    Behavior During Therapy [facility] for tasks assessed/performed             Past Medical History:  Diagnosis Date   Acute blood loss anemia    Age-related nuclear cataract of both eyes 10/13/2019   Arthritis    oa   Candidal vulvovaginitis 06/26/2022   Cardiac murmur 07/14/2022   Cataract    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HCC)    Chronic idiopathic constipation 06/26/2022   Colon cancer screening 06/26/2022   Complication associated with orthopedic device (HCC) 07/18/2021   Complication of anesthesia    DDD (degenerative disc disease), cervical    Detrusor instability    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   Drug induced constipation    Elevated blood pressure reading    Essential hypertension    Fracture    Frequent UTI    Gait abnormality    Generalized osteoarthritis    GERD (gastroesophageal reflux disease)    GERD without esophagitis    Glaucoma    Heel spur    Hemorrhoids    Hip fracture requiring operative repair (HCC) 10/14/2018   Hip fracture requiring operative repair, right, closed, initial encounter (HCC) 10/08/2018   History of Clostridium difficile infection    History of COVID-19    History of fracture of right hip 12/16/2018   History of hemiarthroplasty of right hip 07/14/2019   Hyperglycemia    Hyperlipidemia    Hypertension    Hypoalbuminemia due to protein-calorie malnutrition (HCC)    Irregular bowel habits 06/26/2022   Irritable bowel syndrome 06/26/2022   Leukocytosis    Malaise and  fatigue 12/16/2018   Medication intolerance 10/08/2018   Metabolic bone disease    Mixed hyperlipidemia    Moderate episode of recurrent major depressive disorder (HCC)    Muscle weakness    Noncompliance    Nuclear sclerotic cataract of both eyes    Osteoarthritis    Osteoporosis 06/26/2022   Palpitations 07/14/2022   Parkinson disease 07/27/2022   Polyosteoarthritis, 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   PONV (postoperative nausea and vomiting)    Post-operative pain    Postoperative pain    Postural dizziness with presyncope    Primary open angle glaucoma of left eye, mild stage    Primary open angle glaucoma of right eye, mild stage    Steroid-induced hyperglycemia    Symptomatic varicose veins, bilateral 06/15/2022   Thrombocytopenia (HCC)    Toenail fungus 08/18/2019   Trochanteric bursitis of right hip 12/04/2021   Upper abdominal pain 06/26/2022   Urinary frequency    Vertigo    Vitamin B12 deficiency    Past Surgical History:  Procedure Laterality Date   APPENDECTOMY     BLADDER SUSPENSION     CERVICAL DISC SURGERY     arthroplasty C3-4   CERVICAL FUSION     3 4 & 5    CHOLECYSTECTOMY     HIP ARTHROPLASTY Right 10/08/2018   Procedure:  ARTHROPLASTY BIPOLAR HIP (HEMIARTHROPLASTY);  Surgeon: Myrene Galas, MD;  Location: [facility] OR;  Service: Orthopedics;  Laterality: Right;   KNEE SURGERY     OVARIAN CYST REMOVAL     SHOULDER SURGERY     TONSILLECTOMY     Patient Active Problem List   Diagnosis Date Noted   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   Parkinson disease 07/27/2022   Cardiac murmur 07/14/2022   Palpitations 07/14/2022   Arthritis 06/26/2022   Cataract 06/26/2022   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   Complication of anesthesia 06/26/2022   DDD (degenerative disc disease), cervical 06/26/2022   Detrusor instability 06/26/2022   Frequent UTI 06/26/2022   Gait abnormality 06/26/2022   GERD (gastroesophageal reflux disease) 06/26/2022   GERD without esophagitis  06/26/2022   Glaucoma 06/26/2022   Heel spur 06/26/2022   Hemorrhoids 06/26/2022   History of COVID-19 06/26/2022   Hyperlipidemia 06/26/2022   Mixed hyperlipidemia 06/26/2022   Moderate episode of recurrent major depressive disorder (HCC) 06/26/2022   Muscle weakness 06/26/2022   Nuclear sclerotic cataract of both eyes 06/26/2022   Osteoarthritis 06/26/2022   Postural dizziness with presyncope 06/26/2022   Primary open angle glaucoma of left eye, mild stage 06/26/2022   Primary open angle glaucoma of right eye, mild stage 06/26/2022   Vertigo 06/26/2022   Vitamin B12 deficiency 06/26/2022   Candidal vulvovaginitis 06/26/2022   Chronic idiopathic constipation 06/26/2022   Colon cancer screening 06/26/2022   Irregular bowel habits 06/26/2022   Upper abdominal pain 06/26/2022   Irritable bowel syndrome 06/26/2022   Osteoporosis 06/26/2022   Symptomatic varicose veins, bilateral 06/15/2022   Trochanteric bursitis of right hip 12/04/2021   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   Complication associated with orthopedic device (HCC) 07/18/2021   Age-related nuclear cataract of both eyes 10/13/2019   Toenail fungus 08/18/2019   History of hemiarthroplasty of right hip 07/14/2019   History of fracture of right hip 12/16/2018   Malaise and fatigue 12/16/2018   Hypertension    Elevated blood pressure reading    Hypoalbuminemia due to protein-calorie malnutrition (HCC)    Noncompliance    Hip fracture requiring operative repair (HCC) 10/14/2018   Fracture    Hyperglycemia    Urinary frequency    Thrombocytopenia (HCC)    Drug induced constipation    Acute blood loss anemia    Metabolic bone disease    Post-operative pain    Generalized osteoarthritis    Steroid-induced hyperglycemia    Postoperative pain    Leukocytosis    Hip fracture requiring operative repair, right, closed, initial encounter (HCC) 10/08/2018   Medication intolerance 10/08/2018   Essential hypertension     History of Clostridium difficile infection    Polyosteoarthritis, 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   Primary osteoarthritis involving multiple joints 12/27/2015   Degeneration of lumbar intervertebral disc 01/12/2015   Displacement of lumbar intervertebral disc without myelopathy 07/16/2014   Brachial radiculitis 12/04/2013    PCP: Krystal Clark, NP  REFERRING PROVIDER: Harold Hedge, MD  REFERRING DIAG: N81.4 (ICD-10-CM) - Uterovaginal prolapse, unspecified   THERAPY DIAG:  Muscle weakness (generalized)  Abnormal posture  Unspecified lack of coordination  Rationale for Evaluation and Treatment: Rehabilitation  ONSET DATE: several years  SUBJECTIVE:  SUBJECTIVE STATEMENT: Has been getting up only 1-2x at night now, having less leakage overall. Have urge with 5am void and has leakage getting to potty chair.   Fluid intake: Yes: water - 8 glasses per day and one boost and one large bottle with electrolyte     PAIN:  Are you having pain? No   PRECAUTIONS: Fall  RED FLAGS: None   WEIGHT BEARING RESTRICTIONS: No  FALLS:  Has patient fallen in last 6 months? No  LIVING ENVIRONMENT: Lives with: lives alone Lives in: House/apartment   OCCUPATION: retired   PLOF: Independent with household mobility with device and Needs assistance with homemaking  PATIENT GOALS: to have stronger pelvic floor   PERTINENT HISTORY:  Pseduothrombocytopenia,   cataracts, osteoarthritis, chronic constipation, GERD, degenerative disc disease, hyperlipidemia, hypertension, glaucoma, Parkinson disease, multiple drug intolerances Sexual abuse: No  BOWEL MOVEMENT: Pain with bowel movement: No Type of bowel movement:Type (Bristol Stool Scale) 2-4, Frequency 3-4x day, and Strain Yes Fully empty rectum:  No Leakage: No Pads: No Fiber supplement: Yes: miralax daily and fiber 2x day  URINATION: Pain with urination: No Fully empty bladder: No Stream: Strong and Weak Urgency: No Frequency: every 2 hours during the day, several times at night Leakage:  urge with waiting too long Pads: No  INTERCOURSE: Pain with intercourse:  not active  PREGNANCY: Vaginal deliveries 2 C-section deliveries 0 Currently pregnant No  PROLAPSE: None   OBJECTIVE:   DIAGNOSTIC FINDINGS:    COGNITION: Overall cognitive status: Within functional limits for tasks assessed     SENSATION: Light touch: Appears intact Proprioception: Appears intact  MUSCLE LENGTH: Bil hamstrings and adductors limited by 25% 5XSTS unable to complete at eval with fatigue 3 REPS and need of bil UE support  GAIT: Distance walked: 150' Assistive device utilized: Single point cane Level of assistance: Modified independence Comments: slowed gait cadence, decreased step height, decreased stride length, decreased arm swing  POSTURE: rounded shoulders and forward head  PELVIC ALIGNMENT: WFL  LUMBARAROM/PROM:  A/PROM A/PROM  eval  Flexion Limited by 25%  Extension WFL  Right lateral flexion Limited by 50%  Left lateral flexion Limited by 50%  Right rotation Limited by 50%  Left rotation Limited by 50%   (Blank rows = not tested)  LOWER EXTREMITY ROM:  WFL  LOWER EXTREMITY MMT:  Bil hips grossly 3+/5, knees 4/5  PALPATION:   General  fascial restrictions throughout abdomen, no TTP, tight lumbar paraspinals and gluteals                External Perineal Exam atrophy noted, mild redness                             Internal Pelvic Floor no TTP  Patient confirms identification and approves PT to assess internal pelvic floor and treatment Yes No emotional/communication barriers or cognitive limitation. Patient is motivated to learn. Patient understands and agrees with treatment goals and plan. PT explains  patient will be examined in standing, sitting, and lying down to see how their muscles and joints work. When they are ready, they will be asked to remove their underwear so PT can examine their perineum. The patient is also given the option of providing their own chaperone as one is not provided in our facility. The patient also has the right and is explained the right to defer or refuse any part of the evaluation or treatment including the internal exam. With  the patient's consent, PT will use one gloved finger to gently assess the muscles of the pelvic floor, seeing how well it contracts and relaxes and if there is muscle symmetry. After, the patient will get dressed and PT and patient will discuss exam findings and plan of care. PT and patient discuss plan of care, schedule, attendance policy and HEP activities.   PELVIC MMT:   MMT eval  Vaginal 3/5, 3s, 5 reps  Internal Anal Sphincter   External Anal Sphincter   Puborectalis   Diastasis Recti   (Blank rows = not tested)        TONE: Decreased   PROLAPSE: Possible grade 2 anterior wall laxity with   TODAY'S TREATMENT:                                                                                                                              DATE:   07/24/23: X10 Stand pivot transfer with pelvic floor contraction and exhale for decreased leakage at home - initially very flexed stand and several small steps and holding breath, cued for full standing and one larger step to transfer surface.   2X10 Sit to stand without UE support and OH reach in standing Standing marches 2x10 each with UE support Rt step over hurdles with BUE support x10 - unable to complete on Lt due to Rt hip/knee pain Ambulation with rollator 16' with cues for posture,  larger step length, inc step height bil for improved ability to effectively get to/from bathrooms without leakage and decreased fall risk    PATIENT EDUCATION:  Education details: [id number] Person  educated: Patient Education method: Explanation, Demonstration, Tactile cues, Verbal cues, and Handouts Education comprehension: verbalized understanding and returned demonstration  HOME EXERCISE PROGRAM: [id number]  ASSESSMENT:  CLINICAL IMPRESSION: Patient session focused on urge drill for her individual needs with getting to potty chair with first morning void. Pt does demonstrate forward flexed posture, decreased step height and cadence and decreased balance. Session then focused on mechanics for improved standing fully to decreased stress at bladder with mobility and decreased increased safety and efficiency with getting to bathrooms with decreased leakage due to poor standing and gait mechanics. Pt would benefit from additional PT to further address deficits.        OBJECTIVE IMPAIRMENTS: decreased activity tolerance, decreased balance, decreased coordination, decreased endurance, decreased mobility, decreased strength, increased fascial restrictions, increased muscle spasms, impaired flexibility, impaired sensation, improper body mechanics, and postural dysfunction.   ACTIVITY LIMITATIONS: continence and locomotion level  PARTICIPATION LIMITATIONS: community activity  PERSONAL FACTORS: Time since onset of injury/illness/exacerbation and 1 comorbidity: medical history  are also affecting patient's functional outcome.   REHAB POTENTIAL: Good  CLINICAL DECISION MAKING: Evolving/moderate complexity  EVALUATION COMPLEXITY: Moderate   GOALS: Goals reviewed with patient? Yes  SHORT TERM GOALS: Target date: 07/25/23  Pt to be I with HEP.  Baseline: Goal status: INITIAL  2.  Pt to be I with breathing  and voiding mechanics for decreased strain at pelvic floor.  Baseline:  Goal status: INITIAL  3.  Pt to be I with urge drill for decreased urinary leakage Goal status: INITIAL   LONG TERM GOALS: Target date: 10/03/23  Pt to be I with advanced HEP.  Baseline:  Goal status:  INITIAL  2.  Pt to demonstrate at least 4/5 pelvic floor strength for improved pelvic stability and decreased strain at pelvic floor/ decrease leakage.  Baseline:  Goal status: INITIAL  3.  Pt to demonstrate at least 5/5 bil hip strength for improved pelvic stability.  Baseline:  Goal status: INITIAL  4.  Pt to demonstrate improved 5xSTS to  no more than 23s for decreased fall risk Baseline:  Goal status: INITIAL  5.  Pt to demonstrate improved coordination of pelvic floor and breathing mechanics with body weight Sit to stand  with appropriate synergistic patterns to decrease pain and leakage and strain at pelvic floor  at least 75% of the time.    Baseline:  Goal status: INITIAL   PLAN:  PT FREQUENCY: 1x/week  PT DURATION:  8 sessions  PLANNED INTERVENTIONS: Therapeutic exercises, Therapeutic activity, Neuromuscular re-education, Balance training, Gait training, Patient/Family education, Self Care, Joint mobilization, DME instructions, Aquatic Therapy, Dry Needling, Cryotherapy, Moist heat, Manual lymph drainage, scar mobilization, Taping, Vasopneumatic device, Ultrasound, Biofeedback, Manual therapy, and Re-evaluation  PLAN FOR NEXT SESSION: coordination of pelvic floor and breathing with and without exercise, core and hip strengthening, gait mechanics, posture strengthening, breathing mechanics, voiding mechanics, lifting mechanics   Otelia Sergeant, PT, DPT 07/23/2410:56 AM

## 2023-08-07 ENCOUNTER — Encounter: Payer: Medicare Other | Admitting: Physical Therapy

## 2023-08-15 ENCOUNTER — Encounter: Payer: Self-pay | Admitting: Physical Therapy

## 2023-08-22 ENCOUNTER — Encounter: Payer: Medicare Other | Admitting: Physical Therapy

## 2023-09-05 ENCOUNTER — Encounter: Payer: Medicare Other | Admitting: Physical Therapy

## 2023-09-11 ENCOUNTER — Encounter: Payer: Self-pay | Admitting: Physical Therapy

## 2023-09-11 ENCOUNTER — Ambulatory Visit: Payer: Medicare Other | Attending: Obstetrics and Gynecology | Admitting: Physical Therapy

## 2023-09-11 DIAGNOSIS — R279 Unspecified lack of coordination: Secondary | ICD-10-CM | POA: Diagnosis present

## 2023-09-11 DIAGNOSIS — R293 Abnormal posture: Secondary | ICD-10-CM | POA: Diagnosis present

## 2023-09-11 DIAGNOSIS — R269 Unspecified abnormalities of gait and mobility: Secondary | ICD-10-CM | POA: Diagnosis present

## 2023-09-11 DIAGNOSIS — M6281 Muscle weakness (generalized): Secondary | ICD-10-CM | POA: Insufficient documentation

## 2023-09-11 NOTE — Addendum Note (Signed)
Addended by: Barbaraann Faster on: 09/11/2023 12:07 PM   Modules accepted: Orders

## 2023-09-11 NOTE — Patient Instructions (Signed)

## 2023-09-11 NOTE — Therapy (Addendum)
 OUTPATIENT PHYSICAL THERAPY FEMALE PELVIC EVALUATION   Patient Name: [name] MRN: [id number] DOB:[birth year], 77 y.o., female Today's Date: 09/11/2023 Progress Note Reporting Period 07/03/23 to 09/11/23   See note below for Objective Data and Assessment of Progress/Goals.     END OF SESSION:  PT End of Session - 09/11/23 1104     Visit Number 4    Date for PT Re-Evaluation 01/11/24    Authorization Type MCR    Progress Note Due on Visit 14   completed on visit 4   PT Start Time 1101    PT Stop Time 1144    PT Time Calculation (min) 43 min    Activity Tolerance Patient tolerated treatment well    Behavior During Therapy [facility] for tasks assessed/performed             Past Medical History:  Diagnosis Date   Acute blood loss anemia    Age-related nuclear cataract of both eyes 10/13/2019   Arthritis    oa   Candidal vulvovaginitis 06/26/2022   Cardiac murmur 07/14/2022   Cataract    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HCC)    Chronic idiopathic constipation 06/26/2022   Colon cancer screening 06/26/2022   Complication associated with orthopedic device (HCC) 07/18/2021   Complication of anesthesia    DDD (degenerative disc disease), cervical    Detrusor instability    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   Drug induced constipation    Elevated blood pressure reading    Essential hypertension    Fracture    Frequent UTI    Gait abnormality    Generalized osteoarthritis    GERD (gastroesophageal reflux disease)    GERD without esophagitis    Glaucoma    Heel spur    Hemorrhoids    Hip fracture requiring operative repair (HCC) 10/14/2018   Hip fracture requiring operative repair, right, closed, initial encounter (HCC) 10/08/2018   History of Clostridium difficile infection    History of COVID-19    History of fracture of right hip 12/16/2018   History of hemiarthroplasty of right hip 07/14/2019   Hyperglycemia    Hyperlipidemia    Hypertension    Hypoalbuminemia due  to protein-calorie malnutrition (HCC)    Irregular bowel habits 06/26/2022   Irritable bowel syndrome 06/26/2022   Leukocytosis    Malaise and fatigue 12/16/2018   Medication intolerance 10/08/2018   Metabolic bone disease    Mixed hyperlipidemia    Moderate episode of recurrent major depressive disorder (HCC)    Muscle weakness    Noncompliance    Nuclear sclerotic cataract of both eyes    Osteoarthritis    Osteoporosis 06/26/2022   Palpitations 07/14/2022   Parkinson disease (HCC) 07/27/2022   Polyosteoarthritis, 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   PONV (postoperative nausea and vomiting)    Post-operative pain    Postoperative pain    Postural dizziness with presyncope    Primary open angle glaucoma of left eye, mild stage    Primary open angle glaucoma of right eye, mild stage    Steroid-induced hyperglycemia    Symptomatic varicose veins, bilateral 06/15/2022   Thrombocytopenia (HCC)    Toenail fungus 08/18/2019   Trochanteric bursitis of right hip 12/04/2021   Upper abdominal pain 06/26/2022   Urinary frequency    Vertigo    Vitamin B12 deficiency    Past Surgical History:  Procedure Laterality Date   APPENDECTOMY     BLADDER SUSPENSION     CERVICAL DISC SURGERY     arthroplasty  C3-4   CERVICAL FUSION     3 4 & 5    CHOLECYSTECTOMY     HIP ARTHROPLASTY Right 10/08/2018   Procedure: ARTHROPLASTY BIPOLAR HIP (HEMIARTHROPLASTY);  Surgeon: Hardy Lia, MD;  Location: [facility] OR;  Service: Orthopedics;  Laterality: Right;   KNEE SURGERY     OVARIAN CYST REMOVAL     SHOULDER SURGERY     TONSILLECTOMY     Patient Active Problem List   Diagnosis Date Noted   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   Parkinson disease (HCC) 07/27/2022   Cardiac murmur 07/14/2022   Palpitations 07/14/2022   Arthritis 06/26/2022   Cataract 06/26/2022   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   Complication of anesthesia 06/26/2022   DDD (degenerative disc disease), cervical 06/26/2022   Detrusor  instability 06/26/2022   Frequent UTI 06/26/2022   Gait abnormality 06/26/2022   GERD (gastroesophageal reflux disease) 06/26/2022   GERD without esophagitis 06/26/2022   Glaucoma 06/26/2022   Heel spur 06/26/2022   Hemorrhoids 06/26/2022   History of COVID-19 06/26/2022   Hyperlipidemia 06/26/2022   Mixed hyperlipidemia 06/26/2022   Moderate episode of recurrent major depressive disorder (HCC) 06/26/2022   Muscle weakness 06/26/2022   Nuclear sclerotic cataract of both eyes 06/26/2022   Osteoarthritis 06/26/2022   Postural dizziness with presyncope 06/26/2022   Primary open angle glaucoma of left eye, mild stage 06/26/2022   Primary open angle glaucoma of right eye, mild stage 06/26/2022   Vertigo 06/26/2022   Vitamin B12 deficiency 06/26/2022   Candidal vulvovaginitis 06/26/2022   Chronic idiopathic constipation 06/26/2022   Colon cancer screening 06/26/2022   Irregular bowel habits 06/26/2022   Upper abdominal pain 06/26/2022   Irritable bowel syndrome 06/26/2022   Osteoporosis 06/26/2022   Symptomatic varicose veins, bilateral 06/15/2022   Trochanteric bursitis of right hip 12/04/2021   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   Complication associated with orthopedic device (HCC) 07/18/2021   Age-related nuclear cataract of both eyes 10/13/2019   Toenail fungus 08/18/2019   History of hemiarthroplasty of right hip 07/14/2019   History of fracture of right hip 12/16/2018   Malaise and fatigue 12/16/2018   Hypertension    Elevated blood pressure reading    Hypoalbuminemia due to protein-calorie malnutrition (HCC)    Noncompliance    Hip fracture requiring operative repair (HCC) 10/14/2018   Fracture    Hyperglycemia    Urinary frequency    Thrombocytopenia (HCC)    Drug induced constipation    Acute blood loss anemia    Metabolic bone disease    Post-operative pain    Generalized osteoarthritis    Steroid-induced hyperglycemia    Postoperative pain     Leukocytosis    Hip fracture requiring operative repair, right, closed, initial encounter (HCC) 10/08/2018   Medication intolerance 10/08/2018   Essential hypertension    History of Clostridium difficile infection    Polyosteoarthritis, 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   Primary osteoarthritis involving multiple joints 12/27/2015   Degeneration of lumbar intervertebral disc 01/12/2015   Displacement of lumbar intervertebral disc without myelopathy 07/16/2014   Brachial radiculitis 12/04/2013    PCP: Despina Floro, NP  REFERRING PROVIDER: Thora Flint, MD  REFERRING DIAG: N81.4 (ICD-10-CM) - Uterovaginal prolapse, unspecified   THERAPY DIAG:  Muscle weakness (generalized)  Abnormal posture  Abnormality of gait and mobility  Unspecified lack of coordination  Rationale for Evaluation and Treatment: Rehabilitation  ONSET DATE: several years  SUBJECTIVE:  SUBJECTIVE STATEMENT: Pt reports she has had cataract surgery - has no restrictions post this per pt. Has had a UTI for 4 weeks - on medication for this. That has cleared up. Did try estrogen cream however reports this irritated her and had a yeast infection and MD told her to stop estrogen for a month to clear yeast. Then start again. Pt reports she also has been limited with knee pain and gel shots are no longer working. Her urologist is leaving their practice and trying to find a new one.   Reports before PT every hour getting up to urinate, then improved to 3-4x a night, and this last week after surgery has gone back up. Does have a little leakage if there is an urge around 3-4am and unable to make it in time. Urge drill has been working with water triggers to decreased leakage overall and make it to the bathroom.   Fluid intake: Yes: water -  8 glasses per day and one boost and one large bottle with electrolyte     PAIN:  Are you having pain? No   PRECAUTIONS: Fall  RED FLAGS: None   WEIGHT BEARING RESTRICTIONS: No  FALLS:  Has patient fallen in last 6 months? No  LIVING ENVIRONMENT: Lives with: lives alone Lives in: House/apartment   OCCUPATION: retired   PLOF: Independent with household mobility with device and Needs assistance with homemaking  PATIENT GOALS: to have stronger pelvic floor   PERTINENT HISTORY:  Pseduothrombocytopenia,   cataracts, osteoarthritis, chronic constipation, GERD, degenerative disc disease, hyperlipidemia, hypertension, glaucoma, Parkinson disease, multiple drug intolerances Sexual abuse: No  BOWEL MOVEMENT: Pain with bowel movement: No Type of bowel movement:Type (Bristol Stool Scale) 2-4, Frequency 3-4x day, and Strain Yes Fully empty rectum: No Leakage: No Pads: No Fiber supplement: Yes: miralax  daily and fiber 2x day  URINATION: Pain with urination: No Fully empty bladder: No Stream: Strong and Weak Urgency: No Frequency: every 2 hours during the day, several times at night Leakage:  urge with waiting too long Pads: No  INTERCOURSE: Pain with intercourse:  not active  PREGNANCY: Vaginal deliveries 2 C-section deliveries 0 Currently pregnant No  PROLAPSE: None   OBJECTIVE:   DIAGNOSTIC FINDINGS:    COGNITION: Overall cognitive status: Within functional limits for tasks assessed     SENSATION: Light touch: Appears intact Proprioception: Appears intact  MUSCLE LENGTH: Bil hamstrings and adductors limited by 25% 5XSTS unable to complete at eval with fatigue 3 REPS and need of bil UE support  GAIT: Distance walked: 150' Assistive device utilized: Single point cane Level of assistance: Modified independence Comments: slowed gait cadence, decreased step height, decreased stride length, decreased arm swing  POSTURE: rounded shoulders and forward  head  PELVIC ALIGNMENT: WFL  LUMBARAROM/PROM:  A/PROM A/PROM  eval  Flexion Limited by 25%  Extension WFL  Right lateral flexion Limited by 50%  Left lateral flexion Limited by 50%  Right rotation Limited by 50%  Left rotation Limited by 50%   (Blank rows = not tested)  LOWER EXTREMITY ROM:  WFL  LOWER EXTREMITY MMT:  Bil hips grossly 3+/5, knees 4/5  PALPATION:   General  fascial restrictions throughout abdomen, no TTP, tight lumbar paraspinals and gluteals                External Perineal Exam atrophy noted, mild redness  Internal Pelvic Floor no TTP  Patient confirms identification and approves PT to assess internal pelvic floor and treatment Yes No emotional/communication barriers or cognitive limitation. Patient is motivated to learn. Patient understands and agrees with treatment goals and plan. PT explains patient will be examined in standing, sitting, and lying down to see how their muscles and joints work. When they are ready, they will be asked to remove their underwear so PT can examine their perineum. The patient is also given the option of providing their own chaperone as one is not provided in our facility. The patient also has the right and is explained the right to defer or refuse any part of the evaluation or treatment including the internal exam. With the patient's consent, PT will use one gloved finger to gently assess the muscles of the pelvic floor, seeing how well it contracts and relaxes and if there is muscle symmetry. After, the patient will get dressed and PT and patient will discuss exam findings and plan of care. PT and patient discuss plan of care, schedule, attendance policy and HEP activities.   PELVIC MMT:   MMT eval  Vaginal 3/5, 3s, 5 reps  Internal Anal Sphincter   External Anal Sphincter   Puborectalis   Diastasis Recti   (Blank rows = not tested)        TONE: Decreased   PROLAPSE: Possible grade 2 anterior  wall laxity with   TODAY'S TREATMENT:                                                                                                                              DATE:   07/24/23: X10 Stand pivot transfer with pelvic floor contraction and exhale for decreased leakage at home - initially very flexed stand and several small steps and holding breath, cued for full standing and one larger step to transfer surface.   2X10 Sit to stand without UE support and OH reach in standing Standing marches 2x10 each with UE support Rt step over hurdles with BUE support x10 - unable to complete on Lt due to Rt hip/knee pain Ambulation with rollator 80' with cues for posture,  larger step length, inc step height bil for improved ability to effectively get to/from bathrooms without leakage and decreased fall risk  09/11/23  X5 Sit to stand with UE support Ambulation with rollator for 150' SBA with cues for increased bil step height, posture, head carriage and bringing rollator closer to body for improved gait mechanics for ambulating to/from bathroom safety Pt had several questions about urge drill, night time urine frequency, and remaining leakage PT educated pt on continuing to use urge drill consistently with night time frequency and to help give more time to make it to toilet PT educated pt on bladder diary, bladder retraining for day time urine voids every 2-3 hours without straining or pushing out urine but to attempt to empty. Pt agreed.  PT educated pt on bladder  irritants and reviewed as well.    PATIENT EDUCATION:  Education details: [id number] Person educated: Patient Education method: Explanation, Demonstration, Tactile cues, Verbal cues, and Handouts Education comprehension: verbalized understanding and returned demonstration  HOME EXERCISE PROGRAM: [email or web address]  ASSESSMENT:  CLINICAL IMPRESSION: Patient session focused on education mentioned above, pt denied additional questions and all  answered. Pt encouraged to work on bladder retraining, bladder diary and she agreed. Pt reports she is planning to start PT for her parkinson's and doesn't think she will be able to do both with her energy right now and may need to take a break from PFPT until that is done and she will continue to complete PFPT recommendations and exercises. Pt has not been able to come to PFPT recently due to surgery and her infections and would benefit from additional appointments. Pt would benefit from additional PT to further address deficits.        OBJECTIVE IMPAIRMENTS: decreased activity tolerance, decreased balance, decreased coordination, decreased endurance, decreased mobility, decreased strength, increased fascial restrictions, increased muscle spasms, impaired flexibility, impaired sensation, improper body mechanics, and postural dysfunction.   ACTIVITY LIMITATIONS: continence and locomotion level  PARTICIPATION LIMITATIONS: community activity  PERSONAL FACTORS: Time since onset of injury/illness/exacerbation and 1 comorbidity: medical history  are also affecting patient's functional outcome.   REHAB POTENTIAL: Good  CLINICAL DECISION MAKING: Evolving/moderate complexity  EVALUATION COMPLEXITY: Moderate   GOALS: Goals reviewed with patient? Yes  SHORT TERM GOALS: Target date: 07/25/23  Pt to be I with HEP.  Baseline: Goal status: MET  2.  Pt to be I with breathing and voiding mechanics for decreased strain at pelvic floor.  Baseline:  Goal status: MET  3.  Pt to be I with urge drill for decreased urinary leakage Goal status: MET   LONG TERM GOALS: Target date: 10/03/23  Pt to be I with advanced HEP.  Baseline:  Goal status: on going  2.  Pt to demonstrate at least 4/5 pelvic floor strength for improved pelvic stability and decreased strain at pelvic floor/ decrease leakage.  Baseline:  Goal status: on going  3.  Pt to demonstrate at least 5/5 bil hip strength for improved  pelvic stability.  Baseline:  Goal status: on going  4.  Pt to demonstrate improved 5xSTS to  no more than 23s for decreased fall risk Baseline:  Goal status: on going  5.  Pt to demonstrate improved coordination of pelvic floor and breathing mechanics with body weight Sit to stand  with appropriate synergistic patterns to decrease pain and leakage and strain at pelvic floor  at least 75% of the time.    Baseline:  Goal status: on going   PLAN:  PT FREQUENCY: 1x/week  PT DURATION:  8 sessions  PLANNED INTERVENTIONS: Therapeutic exercises, Therapeutic activity, Neuromuscular re-education, Balance training, Gait training, Patient/Family education, Self Care, Joint mobilization, DME instructions, Aquatic Therapy, Dry Needling, Cryotherapy, Moist heat, Manual lymph drainage, scar mobilization, Taping, Vasopneumatic device, Ultrasound, Biofeedback, Manual therapy, and Re-evaluation  PLAN FOR NEXT SESSION: coordination of pelvic floor and breathing with and without exercise, core and hip strengthening, gait mechanics, posture strengthening, breathing mechanics, voiding mechanics, lifting mechanics   Avie Lemme, PT, DPT 09/11/2411:06 PM   PHYSICAL THERAPY DISCHARGE SUMMARY  Visits from Start of Care: 4  Current functional level related to goals / functional outcomes: Unable to formally reassess    Remaining deficits: Unable to formally reassess, pt unable to return since last visit   Education / Equipment: HEP  Patient agrees to discharge. Patient goals were partially met. Patient is being discharged due to not returning since the last visit.  Avie Lemme, PT, DPT 04/28/259:16 AM

## 2023-09-19 ENCOUNTER — Encounter: Payer: Medicare Other | Admitting: Physical Therapy

## 2023-09-24 ENCOUNTER — Ambulatory Visit: Payer: Medicare Other | Attending: Obstetrics and Gynecology | Admitting: Physical Therapy

## 2023-09-24 ENCOUNTER — Telehealth: Payer: Self-pay | Admitting: Physical Therapy

## 2023-09-24 DIAGNOSIS — M6281 Muscle weakness (generalized): Secondary | ICD-10-CM | POA: Insufficient documentation

## 2023-09-24 DIAGNOSIS — R269 Unspecified abnormalities of gait and mobility: Secondary | ICD-10-CM | POA: Insufficient documentation

## 2023-09-24 DIAGNOSIS — R293 Abnormal posture: Secondary | ICD-10-CM | POA: Insufficient documentation

## 2023-09-24 DIAGNOSIS — R279 Unspecified lack of coordination: Secondary | ICD-10-CM | POA: Insufficient documentation

## 2023-09-24 NOTE — Telephone Encounter (Signed)
PT called pt about this morning's appointment at 1145. Pt very apologetic but thought he cancelled this appointment. Pt states she has the Norovirus and unable to make it due to illness.   Otelia Sergeant, PT, DPT 09/24/2411:26 PM

## 2024-03-08 IMAGING — MR MR HEAD WO/W CM
12 series · 48 of 48 positions shown · IV contrast (12 ML MULTIHANCE)
Comparison: Head CT 07/21/2018

CLINICAL DATA: Generalized muscle weakness for 6 months with
unsteady gait and tremors. Symptoms affect the left more than right
hand. History of fall six months ago

EXAM:
MRI HEAD WITHOUT AND WITH CONTRAST
TECHNIQUE: Multiplanar, multiecho pulse sequences of the brain and surrounding
structures were obtained without and with intravenous contrast.
CONTRAST:  12mL MULTIHANCE GADOBENATE DIMEGLUMINE 529 MG/ML IV SOLN

[Series 2: T1 · sagittal · 5.0mm · 0.45mm/px · 2 of 22 slices shown]
[im 1/22]
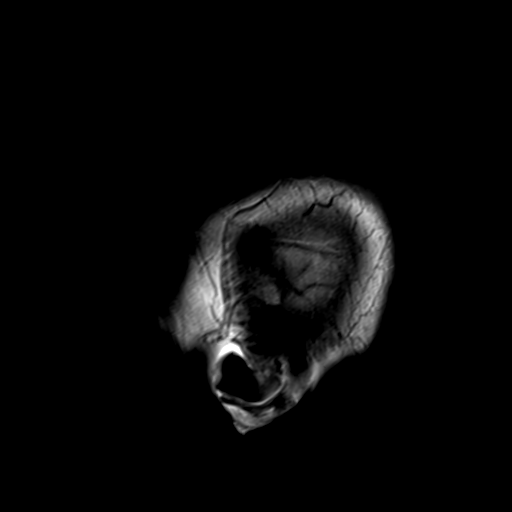
[im 22/22]
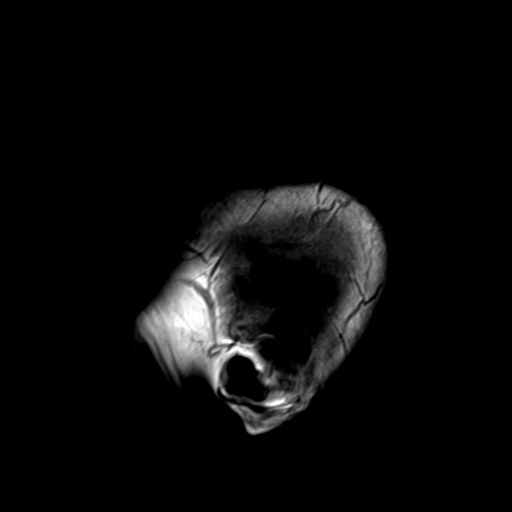

[Series 3: ax ep2d_diff_3 · axial · 3.0mm · 1.80mm/px · z∈[-64,+76]mm · 6 of 93 slices shown]
[im 1/93]
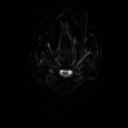
[im 19/93]
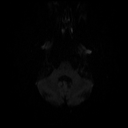
[im 37/93]
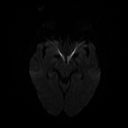
[im 56/93]
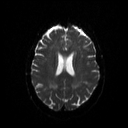
[im 74/93]
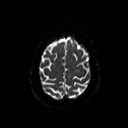
[im 93/93]
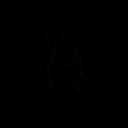

[Series 4: ax ep2d_diff_3_adc · axial · 3.0mm · 1.80mm/px · z∈[-70,+76]mm · 3 of 51 slices shown]
[im 1/51]
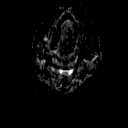
[im 26/51]
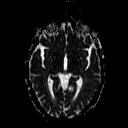
[im 51/51]
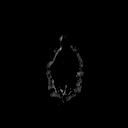

[Series 5: cor ep2d_diff · coronal · 5.0mm · 1.77mm/px · 3 of 53 slices shown]
[im 1/53]
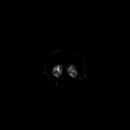
[im 27/53]
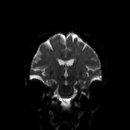
[im 53/53]
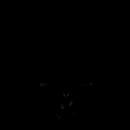

[Series 6: cor ep2d_diff_adc · coronal · 5.0mm · 1.77mm/px · 2 of 27 slices shown]
[im 1/27]
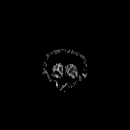
[im 27/27]
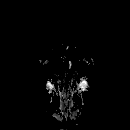

[Series 8: swi_images · axial · 2.0mm · 0.98mm/px · z∈[-62,+74]mm · 5 of 72 slices shown]
[im 1/72]
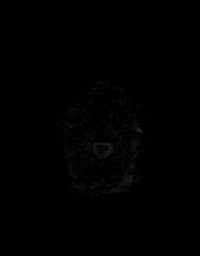
[im 18/72]
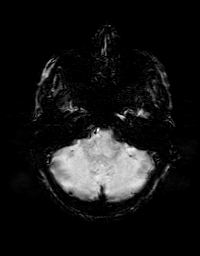
[im 36/72]
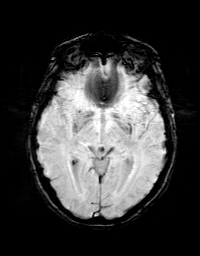
[im 54/72]
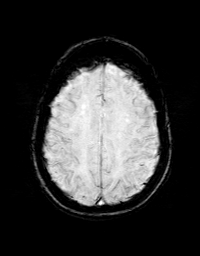
[im 72/72]
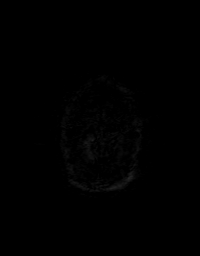

[Series 9: FLAIR · axial · 3.0mm · 0.43mm/px · z∈[-71,+74]mm · 3 of 40 slices shown]
[im 1/40]
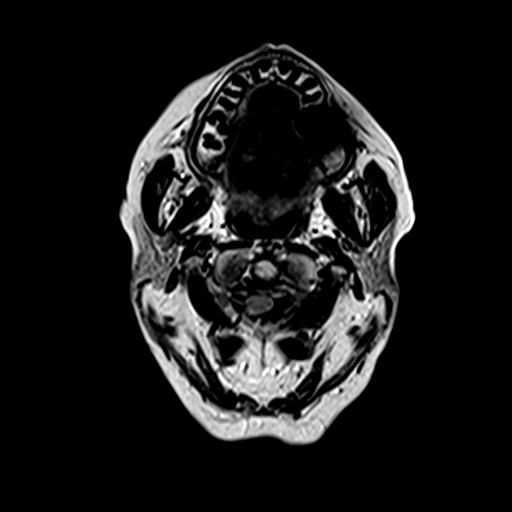
[im 20/40]
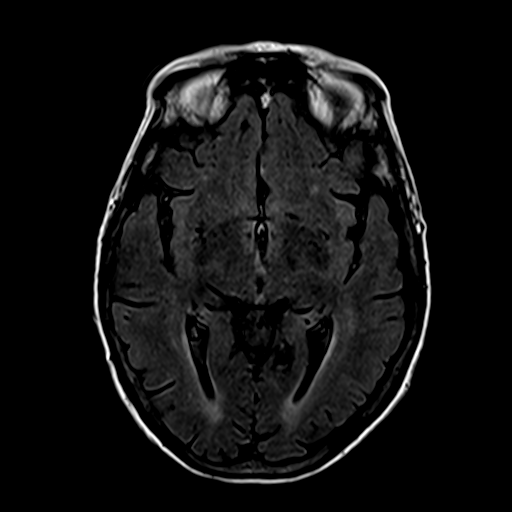
[im 40/40]
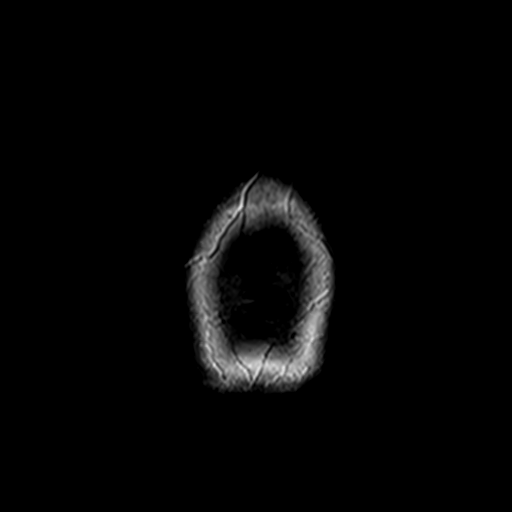

[Series 10: T2 · axial · 5.0mm · 0.65mm/px · z∈[-74,+86]mm · 2 of 29 slices shown (1 of 2)]
[im 1/29]
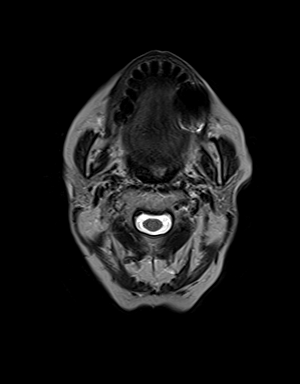
[im 29/29]
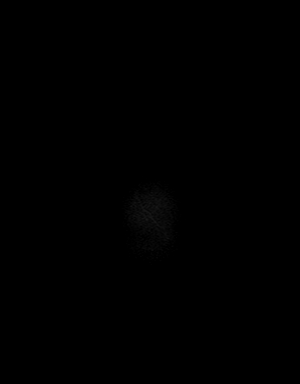

[Series 11: t1_mpr_tra · axial · 1.0mm · 0.72mm/px · z∈[-65,+71]mm · 9 of 144 slices shown]
[im 1/144]
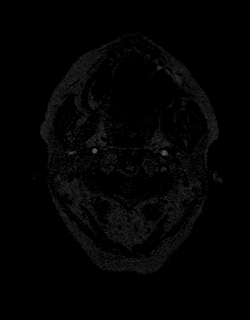
[im 18/144]
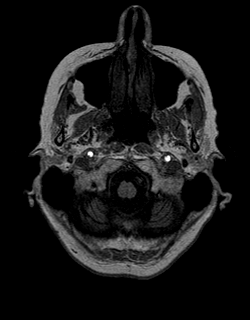
[im 36/144]
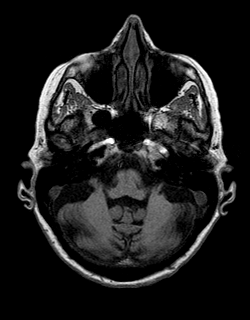
[im 54/144]
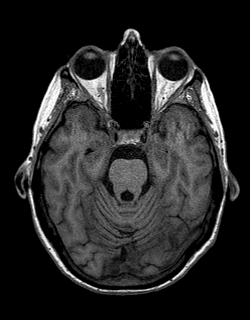
[im 72/144]
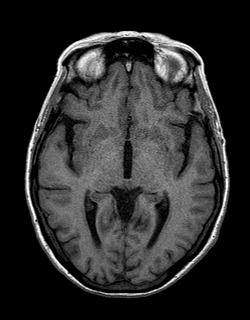
[im 90/144]
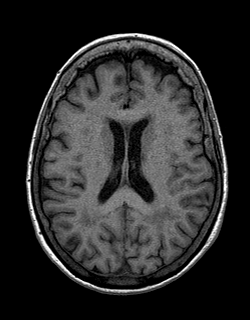
[im 108/144]
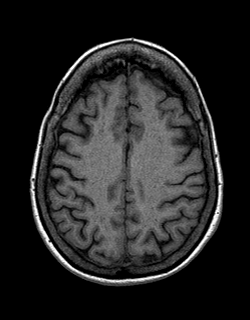
[im 126/144]
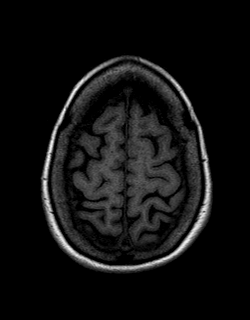
[im 144/144]
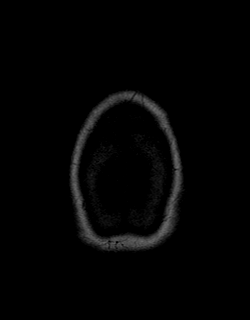

[Series 12: T2 · coronal · 5.0mm · 0.43mm/px · 2 of 28 slices shown (2 of 2)]
[im 1/28]
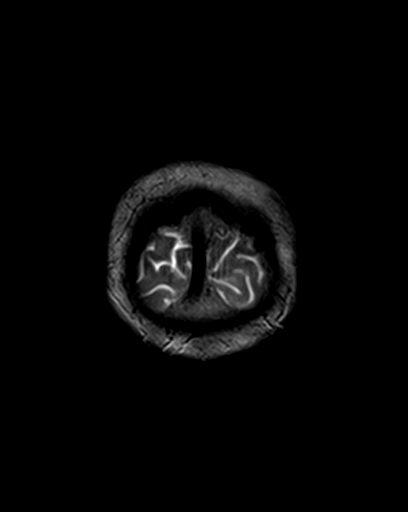
[im 28/28]
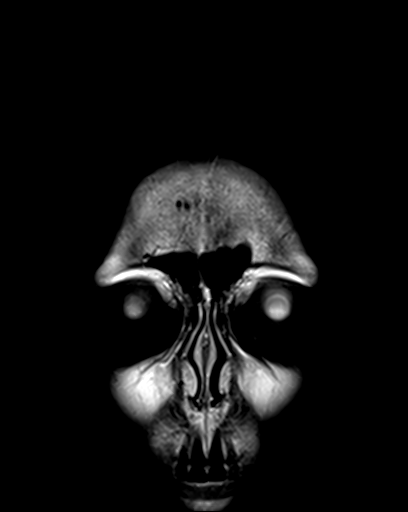

[Series 13: post t1_mpr_tra · axial · 1.0mm · 0.72mm/px · z∈[-65,+71]mm · 9 of 144 slices shown]
[im 1/144]
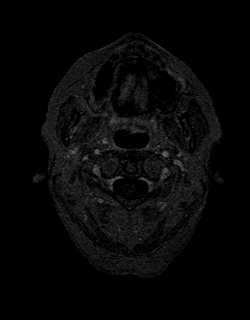
[im 18/144]
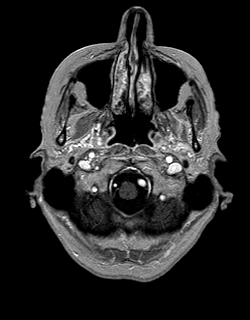
[im 36/144]
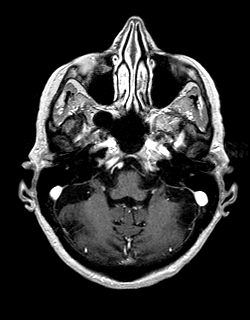
[im 54/144]
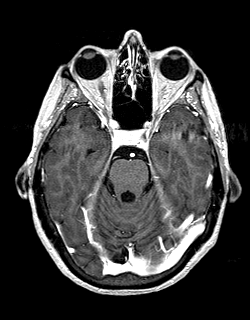
[im 72/144]
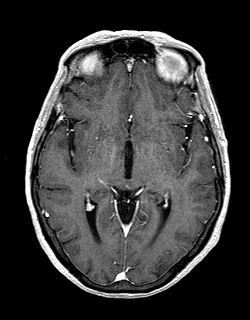
[im 90/144]
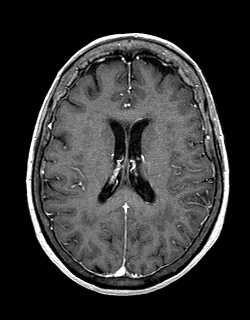
[im 108/144]
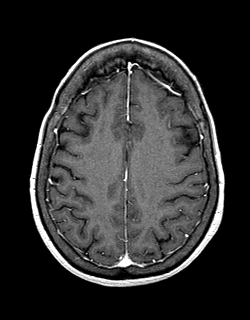
[im 126/144]
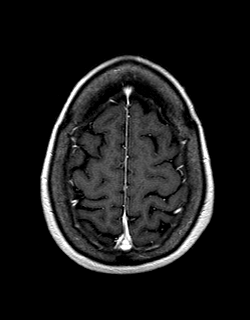
[im 144/144]
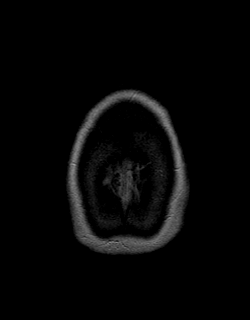

[Series 14: T1 post-contrast · coronal · 5.0mm · 0.72mm/px · 2 of 29 slices shown]
[im 1/29]
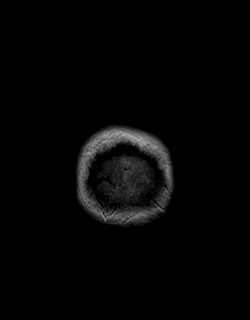
[im 29/29]
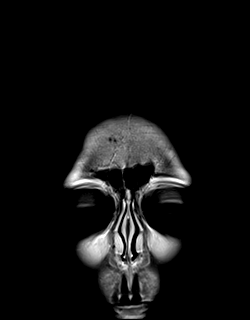

[48 of 48 positions shown; findings below may reference images not displayed]

FINDINGS: Brain: Cerebellar predominant atrophy which is pronounced. No
specific features or signal abnormality to implicate an underlying
cause. More normal supratentorial brain volume for age. Mild for age
periventricular chronic small vessel ischemia. No acute or subacute
infarct, hemorrhage, hydrocephalus, or collection. No masslike
finding

Vascular: Normal flow voids and vascular enhancement

Skull and upper cervical spine: Normal marrow signal

Sinuses/Orbits: Negative
IMPRESSION: 1. Cerebellar atrophy.
2. Mild for age chronic small vessel ischemia.
3. No reversible finding.
